# Patient Record
Sex: Female | Born: 1945
Health system: Southern US, Community
[De-identification: ages and names within clinical notes are randomized; demographics above are authoritative.]

## PROBLEM LIST (undated history)

## (undated) DIAGNOSIS — I213 ST elevation (STEMI) myocardial infarction of unspecified site: Secondary | ICD-10-CM

## (undated) DIAGNOSIS — H269 Unspecified cataract: Secondary | ICD-10-CM

## (undated) DIAGNOSIS — I255 Ischemic cardiomyopathy: Secondary | ICD-10-CM

## (undated) DIAGNOSIS — E785 Hyperlipidemia, unspecified: Secondary | ICD-10-CM

## (undated) DIAGNOSIS — I5022 Chronic systolic (congestive) heart failure: Secondary | ICD-10-CM

## (undated) DIAGNOSIS — K219 Gastro-esophageal reflux disease without esophagitis: Secondary | ICD-10-CM

## (undated) DIAGNOSIS — I2510 Atherosclerotic heart disease of native coronary artery without angina pectoris: Secondary | ICD-10-CM

## (undated) DIAGNOSIS — F0390 Unspecified dementia without behavioral disturbance: Secondary | ICD-10-CM

## (undated) DIAGNOSIS — I10 Essential (primary) hypertension: Secondary | ICD-10-CM

## (undated) HISTORY — PX: CORONARY ANGIOPLASTY WITH STENT PLACEMENT: SHX49

## (undated) HISTORY — DX: Atherosclerotic heart disease of native coronary artery without angina pectoris: I25.10

## (undated) HISTORY — PX: CATARACT EXTRACTION W/ INTRAOCULAR LENS IMPLANT: SHX1309

## (undated) HISTORY — DX: Hyperlipidemia, unspecified: E78.5

## (undated) HISTORY — DX: Ischemic cardiomyopathy: I25.5

## (undated) HISTORY — DX: Chronic systolic (congestive) heart failure: I50.22

## (undated) HISTORY — DX: Unspecified cataract: H26.9

## (undated) HISTORY — DX: Essential (primary) hypertension: I10

## (undated) SURGERY — ERCP, WITH INTERVENTION IF INDICATED
Anesthesia: Moderate Sedation

---

## 1968-10-27 HISTORY — PX: DILATION AND CURETTAGE OF UTERUS: SHX78

## 1978-10-28 HISTORY — PX: TONSILLECTOMY AND ADENOIDECTOMY: SUR1326

## 2009-09-05 ENCOUNTER — Emergency Department: Payer: Self-pay | Admitting: Emergency Medicine

## 2009-09-22 LAB — BASIC METABOLIC PANEL
BUN: 32 mg/dL — AB (ref 4–21)
Creatinine: 1.2 mg/dL — AB (ref <=1.1)

## 2009-09-22 LAB — HEMOGLOBIN A1C: Hgb A1c MFr Bld: 9.7 % — AB (ref 4.0–6.0)

## 2009-10-24 ENCOUNTER — Ambulatory Visit: Payer: Self-pay | Admitting: Cardiovascular Disease

## 2009-11-10 ENCOUNTER — Ambulatory Visit: Admission: RE | Admit: 2009-11-10 | Discharge: 2009-11-10 | Payer: Self-pay | Admitting: Internal Medicine

## 2009-11-10 ENCOUNTER — Ambulatory Visit (HOSPITAL_COMMUNITY): Admission: RE | Admit: 2009-11-10 | Discharge: 2009-11-10 | Payer: Self-pay | Admitting: Internal Medicine

## 2009-11-11 ENCOUNTER — Encounter: Admission: RE | Admit: 2009-11-11 | Discharge: 2009-11-11 | Payer: Self-pay | Admitting: Internal Medicine

## 2009-12-28 ENCOUNTER — Ambulatory Visit: Payer: Self-pay | Admitting: Cardiovascular Disease

## 2010-03-07 ENCOUNTER — Encounter: Payer: Self-pay | Admitting: Cardiovascular Disease

## 2010-03-07 DIAGNOSIS — E785 Hyperlipidemia, unspecified: Secondary | ICD-10-CM | POA: Insufficient documentation

## 2010-03-07 DIAGNOSIS — I10 Essential (primary) hypertension: Secondary | ICD-10-CM | POA: Insufficient documentation

## 2010-03-07 DIAGNOSIS — I2510 Atherosclerotic heart disease of native coronary artery without angina pectoris: Secondary | ICD-10-CM | POA: Insufficient documentation

## 2010-03-07 DIAGNOSIS — I5022 Chronic systolic (congestive) heart failure: Secondary | ICD-10-CM | POA: Insufficient documentation

## 2010-03-07 DIAGNOSIS — I255 Ischemic cardiomyopathy: Secondary | ICD-10-CM | POA: Insufficient documentation

## 2010-03-30 ENCOUNTER — Ambulatory Visit (INDEPENDENT_AMBULATORY_CARE_PROVIDER_SITE_OTHER): Payer: Medicare Other | Admitting: Cardiovascular Disease

## 2010-03-30 DIAGNOSIS — I5022 Chronic systolic (congestive) heart failure: Secondary | ICD-10-CM

## 2010-03-30 DIAGNOSIS — I2510 Atherosclerotic heart disease of native coronary artery without angina pectoris: Secondary | ICD-10-CM

## 2010-03-30 DIAGNOSIS — Z9861 Coronary angioplasty status: Secondary | ICD-10-CM

## 2010-07-18 ENCOUNTER — Encounter: Payer: Self-pay | Admitting: Cardiovascular Disease

## 2010-07-18 ENCOUNTER — Ambulatory Visit (INDEPENDENT_AMBULATORY_CARE_PROVIDER_SITE_OTHER): Payer: Medicare Other | Admitting: Cardiovascular Disease

## 2010-07-18 VITALS — BP 94/60 | HR 64 | Wt 255.0 lb

## 2010-07-18 DIAGNOSIS — I2510 Atherosclerotic heart disease of native coronary artery without angina pectoris: Secondary | ICD-10-CM

## 2010-07-18 DIAGNOSIS — I509 Heart failure, unspecified: Secondary | ICD-10-CM

## 2010-07-18 MED ORDER — CLOPIDOGREL BISULFATE 75 MG PO TABS: 75.0000 mg | ORAL_TABLET | Freq: Every day | ORAL | Status: DC

## 2010-07-18 NOTE — Assessment & Plan Note (Signed)
She has not had any angina. She has a bare-metal stent in her right coronary artery. The left anterior descending artery is chronically occluded. I do not think that she needs to restart her Plavix since she's been out of it for several weeks already. She will increase her aspirin to 25 mg a day.

## 2010-07-18 NOTE — Assessment & Plan Note (Signed)
Her congestive heart failure appears to be fairly stable. She is on carvedilol as well as lisinopril. Her blood pressure is marginal and we will not be able to increase the dose of those medications.    She has gained a lot of weight I suspect that this is at least partially responsible for her shortness of breath. I've encouraged her to exercise regularly and to lose weight.

## 2010-07-18 NOTE — Progress Notes (Signed)
C/o sob denies chest pain, pt not been on plavix for " a while". Stented 08/2009, pt hypotensive and not feeling well, laying around a lot.Alfonso Ramus RN

## 2010-07-18 NOTE — Progress Notes (Signed)
Kaitlyn Simmons Date of Birth  [birth year] [facility] [address]     [address] [place], [place]  [address] [phone]  Fax  [phone]  History of Present Illness: Pt presents for routine visit.  She is a middle-age female with a history of coronary artery disease and subsequent ischemic cardiopathy and congestive heart failure. She has a chronically occluded left anterior descending artery. She has had PTCA and stenting of her right coronary artery. Her left ventricular systolic function is moderately depressed with an ejection fraction of around 30%. All of this work is done at [facility] - July , 2011.   Ran out of her Plavix. She has a bare metal stent in her RCA placed at [facility] in July , 2011.  No significant episodes of angina.  Moderate dyspnea  Doing everyday activities.  She has gained some weight ( 24 lbs) since last visit.  Current Outpatient Prescriptions on File Prior to Visit  Medication Sig Dispense Refill  . acetaminophen (TYLENOL) 325 MG tablet Take 650 mg by mouth as needed.        Marland Kitchen aspirin 81 MG tablet Take 81 mg by mouth daily.        Marland Kitchen atorvastatin (LIPITOR) 80 MG tablet Take 80 mg by mouth daily.        . carvedilol (COREG) 6.25 MG tablet Take 6.25 mg by mouth 2 (two) times daily with meals.        Marland Kitchen lisinopril (PRINIVIL,ZESTRIL) 5 MG tablet Take 5 mg by mouth daily.        . pantoprazole (PROTONIX) 40 MG tablet Take 40 mg by mouth daily.        . risperiDONE (RISPERDAL) 0.25 MG tablet Take 0.25 mg by mouth nightly.        Marland Kitchen spironolactone (ALDACTONE) 25 MG tablet Take 25 mg by mouth daily.        Marland Kitchen DISCONTD: clopidogrel (PLAVIX) 75 MG tablet Take 75 mg by mouth daily.          No Known Allergies  Past Medical History  Diagnosis Date  . Myocardial infarction     JULY 2011  . CHF (congestive heart failure)     EF: 30%  . HTN (hypertension)   . Hyperlipidemia   . Ischemic cardiomyopathy   . Coronary artery  disease     post PTCA and stenting    Past Surgical History  Procedure Date  . Coronary angioplasty with stent placement     RCA  . Dilation and curettage of uterus     History  Smoking status  . Former Smoker  . Quit date: 10/25/2002  Smokeless tobacco  . Not on file    History  Alcohol Use No    Family History  Problem Relation Age of Onset  . Heart attack Father     DECEASED  . Stroke Father     DECEASED  . Leukemia Mother     DECEASED  . Heart attack Sister     ALIVE  . Hypertension Sister     Reviw of Systems:  Reviewed in the HPI.  All other systems are negative.  Physical Exam: BP 94/60  Pulse 64  Wt 255 lb (115.667 kg)  SpO2 97% The patient is alert and oriented x 3.  The mood and affect are normal.  The skin is warm and dry.  Color is normal.  The HEENT exam reveals that the sclera are nonicteric.  The mucous membranes are moist.  The carotids are 2+ without bruits.  There is no thyromegaly.  There is no JVD.  The lungs are clear.  The chest wall is non tender.  The heart exam reveals a regular rate with a normal S1 and S2.  There are no murmurs, gallops, or rubs.  The PMI is not displaced.   Abdominal exam reveals good bowel sounds.  There is no guarding or rebound.  There is no hepatosplenomegaly or tenderness.  There are no masses.  Exam of the legs reveal no clubbing, cyanosis, or edema.  The legs are without rashes.  The distal pulses are intact.  Cranial nerves II - XII are intact.  Motor and sensory functions are intact.  The gait is normal.  Assessment / Plan:

## 2010-08-07 ENCOUNTER — Encounter: Payer: Self-pay | Admitting: Cardiovascular Disease

## 2010-09-17 ENCOUNTER — Emergency Department: Payer: Self-pay | Admitting: Emergency Medicine

## 2010-10-09 ENCOUNTER — Other Ambulatory Visit: Payer: Self-pay | Admitting: *Deleted

## 2010-10-09 ENCOUNTER — Ambulatory Visit (INDEPENDENT_AMBULATORY_CARE_PROVIDER_SITE_OTHER): Payer: Medicare Other | Admitting: Nurse Practitioner

## 2010-10-09 ENCOUNTER — Encounter: Payer: Self-pay | Admitting: Nurse Practitioner

## 2010-10-09 VITALS — BP 136/96 | HR 65 | Ht 65.0 in | Wt 249.2 lb

## 2010-10-09 DIAGNOSIS — I2589 Other forms of chronic ischemic heart disease: Secondary | ICD-10-CM

## 2010-10-09 DIAGNOSIS — E785 Hyperlipidemia, unspecified: Secondary | ICD-10-CM

## 2010-10-09 DIAGNOSIS — I2510 Atherosclerotic heart disease of native coronary artery without angina pectoris: Secondary | ICD-10-CM

## 2010-10-09 DIAGNOSIS — I219 Acute myocardial infarction, unspecified: Secondary | ICD-10-CM

## 2010-10-09 DIAGNOSIS — I255 Ischemic cardiomyopathy: Secondary | ICD-10-CM

## 2010-10-09 DIAGNOSIS — I213 ST elevation (STEMI) myocardial infarction of unspecified site: Secondary | ICD-10-CM

## 2010-10-09 LAB — CBC WITH DIFFERENTIAL/PLATELET
Basophils Absolute: 0.1 10*3/uL (ref 0.0–0.1)
Basophils Relative: 0.8 % (ref 0.0–3.0)
Eosinophils Absolute: 0.2 10*3/uL (ref 0.0–0.7)
Eosinophils Relative: 2.2 % (ref 0.0–5.0)
HCT: 36.4 % (ref 36.0–46.0)
Hemoglobin: 12 g/dL (ref 12.0–15.0)
Lymphocytes Relative: 18.6 % (ref 12.0–46.0)
Lymphs Abs: 1.7 10*3/uL (ref 0.7–4.0)
MCHC: 33 g/dL (ref 30.0–36.0)
MCV: 92.2 fl (ref 78.0–100.0)
Monocytes Absolute: 0.8 10*3/uL (ref 0.1–1.0)
Monocytes Relative: 8.5 % (ref 3.0–12.0)
Neutro Abs: 6.4 10*3/uL (ref 1.4–7.7)
Neutrophils Relative %: 69.9 % (ref 43.0–77.0)
Platelets: 318 10*3/uL (ref 150.0–400.0)
RBC: 3.95 Mil/uL (ref 3.87–5.11)
RDW: 14.8 % — ABNORMAL HIGH (ref 11.5–14.6)
WBC: 9.2 10*3/uL (ref 4.5–10.5)

## 2010-10-09 LAB — BASIC METABOLIC PANEL
BUN: 18 mg/dL (ref 6–23)
CO2: 29 mEq/L (ref 19–32)
Calcium: 9.8 mg/dL (ref 8.4–10.5)
Chloride: 99 mEq/L (ref 96–112)
Creatinine, Ser: 1.4 mg/dL — ABNORMAL HIGH (ref 0.4–1.2)
GFR: 38.62 mL/min — ABNORMAL LOW (ref >60.00)
Glucose, Bld: 105 mg/dL — ABNORMAL HIGH (ref 70–99)
Potassium: 4.5 mEq/L (ref 3.5–5.1)
Sodium: 139 mEq/L (ref 135–145)

## 2010-10-09 MED ORDER — LISINOPRIL 5 MG PO TABS: 10.0000 mg | ORAL_TABLET | Freq: Every day | ORAL | Status: DC

## 2010-10-09 NOTE — Progress Notes (Signed)
Kaitlyn Simmons Date of Birth: 1946-01-31   History of Present Illness: Kaitlyn Simmons is seen today for a post hospital visit. She is seen for Dr. Elease Simmons. She was admitted to [facility] with an MI. She had PCI. We do not have any specific details. Her last EF from her MI last year was 30%. I do not have a current EF. She is doing well with her current medical therapy. She is not having chest pain. She is trying to walk. She is watching her salt. She is interested in cardiac rehab. She does not check her blood pressure at home. She is here with her daughter, Kaitlyn Simmons, who notes some forgetfulness but it is improving since discharge.   Current Outpatient Prescriptions on File Prior to Visit  Medication Sig Dispense Refill  . acetaminophen (TYLENOL) 325 MG tablet Take 650 mg by mouth as needed.        Marland Kitchen aspirin 81 MG tablet Take 81 mg by mouth daily.        Marland Kitchen atorvastatin (LIPITOR) 80 MG tablet Take 80 mg by mouth daily.        . carvedilol (COREG) 6.25 MG tablet Take 6.25 mg by mouth 2 (two) times daily with meals.        . clopidogrel (PLAVIX) 75 MG tablet Take 1 tablet (75 mg total) by mouth daily.  30 tablet  11  . pantoprazole (PROTONIX) 40 MG tablet Take 40 mg by mouth daily.        Marland Kitchen spironolactone (ALDACTONE) 25 MG tablet Take 25 mg by mouth daily.        Marland Kitchen DISCONTD: lisinopril (PRINIVIL,ZESTRIL) 5 MG tablet Take 5 mg by mouth daily.          No Known Allergies  Past Medical History  Diagnosis Date  . Myocardial infarction     JULY 2011 & July 2012  . CHF (congestive heart failure)     EF: 30%  . HTN (hypertension)   . Hyperlipidemia   . Ischemic cardiomyopathy   . Coronary artery disease     post PTCA and stenting of the RCA in 2011, s/p PCI to RCA and LAD in 2012    Past Surgical History  Procedure Date  . Coronary angioplasty with stent placement 2011    RCA  . Dilation and curettage of uterus   . Coronary stent placement 2012    RCA & LAD    History  Smoking status  .  Former Smoker  . Quit date: 10/25/2002  Smokeless tobacco  . Not on file    History  Alcohol Use No    Family History  Problem Relation Age of Onset  . Heart attack Father     DECEASED  . Stroke Father     DECEASED  . Leukemia Mother     DECEASED  . Heart attack Sister     ALIVE  . Hypertension Sister     Review of Systems: The review of systems is positive for forgetfulness that is improving. No chest pain or shortness of breath.  She has had no issues with her groin or arm (cath sites). All other systems were reviewed and are negative.  Physical Exam: BP 136/96  Pulse 65  Ht 5\' 5"  (1.651 m)  Wt 249 lb 3.2 oz (113.036 kg)  BMI 41.47 kg/m2 Patient is very pleasant and in no acute distress. She is obese. Skin is warm and dry. Color is normal.  HEENT is unremarkable. Normocephalic/atraumatic.  PERRL. Sclera are nonicteric. Neck is supple. No masses. No JVD. Lungs are clear. Cardiac exam shows a regular rate and rhythm. Abdomen is soft. Extremities are without edema. Gait and ROM are intact. No gross neurologic deficits noted.  LABORATORY DATA: PENDING  Assessment / Plan:

## 2010-10-09 NOTE — Patient Instructions (Signed)
Increase your Lisinopril to 10 mg daily (take two of your 5 mg tablets daily) Continue with walking as tolerated. Goal will be to work up to 45 minutes each day We will refer you to cardiac rehab in [place] I will see you in 2 weeks.  We will check labs on return. You do not need to fast. Call for any problems I will try and get your records from [facility]

## 2010-10-09 NOTE — Assessment & Plan Note (Addendum)
She has had recurrent MI. She has had another stent placed to the RCA and a new stent to the LAD. We do not have those details. We will obtain records. She is doing well. I have referred her to cardiac rehab in [place]. We will check labs today. Patient is agreeable to this plan and will call if any problems develop in the interim.   Records are now reviewed after the patient has left the office.  She had an anterolateral STEMI and is status post staged PCI. She had BMS to the RCA and then was taken back to the cath lab 2 weeks later for BMS to the mid LAD. She required IABP. She was slow to recover. Other problems included acute heart failure, UTI, acute delirium and acute on chronic kidney disease, stage III. EF was still 35% during that admission.

## 2010-10-09 NOTE — Assessment & Plan Note (Signed)
Last EF was 30% from prior MI. No current EF. Will obtain records. Lisinopril is increased. Labs are checked today. I explained the importance of up titration of her medicines. I will see her back in 2 weeks. Patient is agreeable to this plan and will call if any problems develop in the interim.

## 2010-10-10 ENCOUNTER — Encounter: Payer: Self-pay | Admitting: Nurse Practitioner

## 2010-10-10 ENCOUNTER — Telehealth: Payer: Self-pay | Admitting: *Deleted

## 2010-10-10 NOTE — Telephone Encounter (Signed)
Faxed referral for cardiac rehab at [facility] card. Rehab.Alfonso Ramus RN

## 2010-10-11 ENCOUNTER — Telehealth: Payer: Self-pay | Admitting: *Deleted

## 2010-10-11 NOTE — Telephone Encounter (Signed)
Message copied by Antony Odea on Wed Oct 11, 2010  4:47 PM ------      Message from: Rosalio Macadamia      Created: Mon Oct 09, 2010  4:07 PM       Potassium is ok. Will need repeat BMET on return visit.

## 2010-10-11 NOTE — Telephone Encounter (Signed)
Patient called with lab results. Pt verbalized understanding. Jodette Alenna Russell RN  

## 2010-10-23 ENCOUNTER — Other Ambulatory Visit (INDEPENDENT_AMBULATORY_CARE_PROVIDER_SITE_OTHER): Payer: Medicare Other | Admitting: *Deleted

## 2010-10-23 ENCOUNTER — Encounter: Payer: Self-pay | Admitting: Nurse Practitioner

## 2010-10-23 ENCOUNTER — Telehealth: Payer: Self-pay | Admitting: *Deleted

## 2010-10-23 ENCOUNTER — Ambulatory Visit (INDEPENDENT_AMBULATORY_CARE_PROVIDER_SITE_OTHER): Payer: Medicare Other | Admitting: Nurse Practitioner

## 2010-10-23 VITALS — BP 122/76 | HR 84 | Ht 65.0 in | Wt 253.2 lb

## 2010-10-23 DIAGNOSIS — I509 Heart failure, unspecified: Secondary | ICD-10-CM

## 2010-10-23 DIAGNOSIS — E785 Hyperlipidemia, unspecified: Secondary | ICD-10-CM

## 2010-10-23 DIAGNOSIS — I219 Acute myocardial infarction, unspecified: Secondary | ICD-10-CM

## 2010-10-23 DIAGNOSIS — I255 Ischemic cardiomyopathy: Secondary | ICD-10-CM

## 2010-10-23 DIAGNOSIS — I2510 Atherosclerotic heart disease of native coronary artery without angina pectoris: Secondary | ICD-10-CM

## 2010-10-23 DIAGNOSIS — I213 ST elevation (STEMI) myocardial infarction of unspecified site: Secondary | ICD-10-CM

## 2010-10-23 DIAGNOSIS — I2109 ST elevation (STEMI) myocardial infarction involving other coronary artery of anterior wall: Secondary | ICD-10-CM

## 2010-10-23 DIAGNOSIS — I10 Essential (primary) hypertension: Secondary | ICD-10-CM

## 2010-10-23 DIAGNOSIS — I2589 Other forms of chronic ischemic heart disease: Secondary | ICD-10-CM

## 2010-10-23 LAB — BASIC METABOLIC PANEL
BUN: 12 mg/dL (ref 6–23)
CO2: 26 mEq/L (ref 19–32)
Calcium: 9.4 mg/dL (ref 8.4–10.5)
Chloride: 104 mEq/L (ref 96–112)
Creatinine, Ser: 1.2 mg/dL (ref 0.4–1.2)
GFR: 50.06 mL/min — ABNORMAL LOW (ref >60.00)
Glucose, Bld: 96 mg/dL (ref 70–99)
Potassium: 4.5 mEq/L (ref 3.5–5.1)
Sodium: 140 mEq/L (ref 135–145)

## 2010-10-23 LAB — HEPATIC FUNCTION PANEL
ALT: 21 U/L (ref 0–35)
AST: 25 U/L (ref 0–37)
Albumin: 4 g/dL (ref 3.5–5.2)
Alkaline Phosphatase: 78 U/L (ref 39–117)
Bilirubin, Direct: 0.1 mg/dL (ref 0.0–0.3)
Total Protein: 7 g/dL (ref 6.0–8.3)

## 2010-10-23 LAB — LIPID PANEL
Cholesterol: 138 mg/dL (ref 0–200)
LDL Cholesterol: 68 mg/dL (ref 0–99)
Triglycerides: 161 mg/dL — ABNORMAL HIGH (ref 0.0–149.0)

## 2010-10-23 MED ORDER — LISINOPRIL 10 MG PO TABS: 10.0000 mg | ORAL_TABLET | Freq: Every day | ORAL | Status: DC

## 2010-10-23 MED ORDER — CARVEDILOL 6.25 MG PO TABS: 12.5000 mg | ORAL_TABLET | Freq: Two times a day (BID) | ORAL | Status: DC

## 2010-10-23 NOTE — Assessment & Plan Note (Signed)
Blood pressure looks good. We have room to increase her Coreg.

## 2010-10-23 NOTE — Progress Notes (Signed)
Kaitlyn Simmons Date of Birth: 1945/09/27   History of Present Illness: Kaitlyn Simmons is seen back today for a 2 week check. She is seen for Dr. Elease Hashimoto. She has had an anterolateral MI and is s/p staged PCI with BMS to the RCA and BMS to the mid LAD. This occurred at [facility]. EF remains 35%. We increased her lisinopril at the last visit. She is doing well. No chest pain. Mild DOE reported. She is trying to walk some. Weight has been stable. No CHF symptoms. She is doing well.   Current Outpatient Prescriptions on File Prior to Visit  Medication Sig Dispense Refill  . aspirin 81 MG tablet Take 81 mg by mouth daily.        Marland Kitchen atorvastatin (LIPITOR) 80 MG tablet Take 80 mg by mouth daily.        . carvedilol (COREG) 6.25 MG tablet Take 6.25 mg by mouth 2 (two) times daily with meals.        . clopidogrel (PLAVIX) 75 MG tablet Take 1 tablet (75 mg total) by mouth daily.  30 tablet  11  . lisinopril (PRINIVIL,ZESTRIL) 5 MG tablet Take 2 tablets (10 mg total) by mouth daily.      . pantoprazole (PROTONIX) 40 MG tablet Take 40 mg by mouth daily.        Marland Kitchen spironolactone (ALDACTONE) 25 MG tablet Take 25 mg by mouth daily.        Marland Kitchen acetaminophen (TYLENOL) 325 MG tablet Take 650 mg by mouth as needed.          No Known Allergies  Past Medical History  Diagnosis Date  . Myocardial infarction     JULY 2011 & July 2012  . CHF (congestive heart failure)     EF: 30%  . HTN (hypertension)   . Hyperlipidemia   . Ischemic cardiomyopathy   . Coronary artery disease     post PTCA and stenting of the RCA in 2011, s/p PCI to RCA and LAD in 2012    Past Surgical History  Procedure Date  . Coronary angioplasty with stent placement 2011    RCA  . Dilation and curettage of uterus   . Coronary stent placement 2012    RCA & LAD    History  Smoking status  . Former Smoker  . Quit date: 10/25/2002  Smokeless tobacco  . Not on file    History  Alcohol Use No    Family History  Problem Relation  Age of Onset  . Heart attack Father     DECEASED  . Stroke Father     DECEASED  . Leukemia Mother     DECEASED  . Heart attack Sister     ALIVE  . Hypertension Sister     Review of Systems: The review of systems is as above. Her daughter is here and reports that her mom is doing very well.  No dizziness or chest pain reported. She is tolerating her medicines. No cough. All other systems were reviewed and are negative.  Physical Exam: BP 122/76  Pulse 84  Ht 5\' 5"  (1.651 m)  Wt 253 lb 3.2 oz (114.851 kg)  BMI 42.13 kg/m2 Patient is very pleasant and in no acute distress. She is obese. Skin is warm and dry. Color is normal.  HEENT is unremarkable. Normocephalic/atraumatic. PERRL. Sclera are nonicteric. Neck is supple. No masses. No JVD. Lungs are clear. Cardiac exam shows a regular rate and rhythm. Abdomen is  obese but soft. Extremities are without edema. Gait and ROM are intact. No gross neurologic deficits noted.  LABORATORY DATA: BMET is pending   Assessment / Plan:

## 2010-10-23 NOTE — Patient Instructions (Addendum)
Lets increase the Coreg to 12.5 mg two times a day. You can take two of your 6.25 mg tablets two times a day Stay on your other medicines We will see you back in 3 weeks.

## 2010-10-23 NOTE — Assessment & Plan Note (Signed)
She has had recent MI with 2 vessel staged PCI. She is doing well. No chest pain.

## 2010-10-23 NOTE — Assessment & Plan Note (Signed)
EF is 35%. We are trying to uptitrate her medicines. Coreg is increased to 12.5 mg BID. We will see back in 3 weeks. Patient is agreeable to this plan and will call if any problems develop in the interim.

## 2010-10-23 NOTE — Telephone Encounter (Signed)
Patient called with lab results. msg left to call back for more information. Alfonso Ramus RN

## 2010-10-25 ENCOUNTER — Telehealth: Payer: Self-pay | Admitting: Cardiology

## 2010-10-25 NOTE — Telephone Encounter (Signed)
Message copied by Karle Plumber on Wed Oct 25, 2010  8:59 AM ------      Message from: [place], [place] J      Created: Tue Oct 24, 2010  5:40 PM       Triglycerides are mildly elevated.  Continue diet, and meds.

## 2010-10-25 NOTE — Telephone Encounter (Signed)
Lab results reported. Pt verbalizes understanding to continue same diet and medications.

## 2010-11-13 ENCOUNTER — Encounter: Payer: Self-pay | Admitting: Nurse Practitioner

## 2010-11-13 ENCOUNTER — Ambulatory Visit (INDEPENDENT_AMBULATORY_CARE_PROVIDER_SITE_OTHER): Payer: Medicare Other | Admitting: Nurse Practitioner

## 2010-11-13 VITALS — BP 138/98 | HR 64 | Ht 65.5 in | Wt 254.0 lb

## 2010-11-13 DIAGNOSIS — I2589 Other forms of chronic ischemic heart disease: Secondary | ICD-10-CM

## 2010-11-13 DIAGNOSIS — I509 Heart failure, unspecified: Secondary | ICD-10-CM

## 2010-11-13 DIAGNOSIS — I10 Essential (primary) hypertension: Secondary | ICD-10-CM

## 2010-11-13 DIAGNOSIS — I255 Ischemic cardiomyopathy: Secondary | ICD-10-CM

## 2010-11-13 DIAGNOSIS — I2510 Atherosclerotic heart disease of native coronary artery without angina pectoris: Secondary | ICD-10-CM

## 2010-11-13 MED ORDER — LISINOPRIL 10 MG PO TABS: 10.0000 mg | ORAL_TABLET | Freq: Two times a day (BID) | ORAL | Status: DC

## 2010-11-13 NOTE — Assessment & Plan Note (Signed)
Lisinopril is increased today for her CHF. Hopefully this will also benefit her blood pressure.

## 2010-11-13 NOTE — Assessment & Plan Note (Signed)
Looks compensated today.

## 2010-11-13 NOTE — Patient Instructions (Addendum)
We are going to increase your Lisinopril to 10 mg two times a day I will see you back in 3 weeks. We will check some blood work at your next visit. You do not need to fast.   Continue with your current medicines. Weigh yourself each morning and record. Let us know if your have a weight gain of 3 pounds in 24 hours. Limit sodium intake.

## 2010-11-13 NOTE — Assessment & Plan Note (Signed)
She is doing well. Lisinopril is increased to 10 mg BID. I will see her back in 3 weeks. Will check BMET on return. Hope to increase Coreg at next visit. Patient is agreeable to this plan and will call if any problems develop in the interim.

## 2010-11-13 NOTE — Progress Notes (Signed)
    Kaitlyn Simmons Date of Birth: 1945-08-26   History of Present Illness: Kaitlyn Simmons is seen back today for a 3 week check. She is seen for Kaitlyn Simmons. She had anterolateral MI and is s/p staged PCI with BMS to the RCA and BMS to the mid LAD. EF remains 35%. We are uptitrating her medicines. She feels ok. No chest pain. She is not short of breath. No dizziness. She is tolerating her medicines.   Current Outpatient Prescriptions on File Prior to Visit  Medication Sig Dispense Refill  . acetaminophen (TYLENOL) 325 MG tablet Take 650 mg by mouth as needed.        Marland Kitchen aspirin 81 MG tablet Take 81 mg by mouth daily.        Marland Kitchen atorvastatin (LIPITOR) 80 MG tablet Take 80 mg by mouth daily.        . clopidogrel (PLAVIX) 75 MG tablet Take 1 tablet (75 mg total) by mouth daily.  30 tablet  11  . pantoprazole (PROTONIX) 40 MG tablet Take 40 mg by mouth daily.        Marland Kitchen spironolactone (ALDACTONE) 25 MG tablet Take 25 mg by mouth daily.          No Known Allergies  Past Medical History  Diagnosis Date  . Myocardial infarction     JULY 2011 & July 2012  . CHF (congestive heart failure)     EF: 30 to 35%  . HTN (hypertension)   . Hyperlipidemia   . Ischemic cardiomyopathy   . Coronary artery disease     post PTCA and stenting of the RCA in 2011, s/p PCI to RCA and LAD in 2012    Past Surgical History  Procedure Date  . Coronary angioplasty with stent placement 2011    RCA  . Dilation and curettage of uterus   . Coronary stent placement 2012    RCA & LAD    History  Smoking status  . Former Smoker  . Quit date: 10/25/2002  Smokeless tobacco  . Not on file    History  Alcohol Use No    Family History  Problem Relation Age of Onset  . Heart attack Father     DECEASED  . Stroke Father     DECEASED  . Leukemia Mother     DECEASED  . Heart attack Sister     ALIVE  . Hypertension Sister     Review of Systems: The review of systems is as above.  No SOB, PND, edema or  orthopnea. No cough. All other systems were reviewed and are negative.  Physical Exam: BP 138/98  Pulse 64  Ht 5' 5.5" (1.664 m)  Wt 254 lb (115.214 kg)  BMI 41.63 kg/m2 Patient is very pleasant and in no acute distress. She is obese. Skin is warm and dry. Color is normal.  HEENT is unremarkable. Normocephalic/atraumatic. PERRL. Sclera are nonicteric. Neck is supple. No masses. No JVD. Lungs are clear. Cardiac exam shows a regular rate and rhythm. Abdomen is obese but soft. Extremities are without edema. Gait and ROM are intact. No gross neurologic deficits noted.   LABORATORY DATA:   Assessment / Plan:

## 2010-12-04 ENCOUNTER — Other Ambulatory Visit (INDEPENDENT_AMBULATORY_CARE_PROVIDER_SITE_OTHER): Payer: Medicare Other | Admitting: *Deleted

## 2010-12-04 ENCOUNTER — Ambulatory Visit (INDEPENDENT_AMBULATORY_CARE_PROVIDER_SITE_OTHER): Payer: Medicare Other | Admitting: Nurse Practitioner

## 2010-12-04 ENCOUNTER — Encounter: Payer: Self-pay | Admitting: Nurse Practitioner

## 2010-12-04 DIAGNOSIS — I2589 Other forms of chronic ischemic heart disease: Secondary | ICD-10-CM

## 2010-12-04 DIAGNOSIS — I10 Essential (primary) hypertension: Secondary | ICD-10-CM

## 2010-12-04 DIAGNOSIS — I255 Ischemic cardiomyopathy: Secondary | ICD-10-CM

## 2010-12-04 LAB — BASIC METABOLIC PANEL
BUN: 19 mg/dL (ref 6–23)
CO2: 28 mEq/L (ref 19–32)
Calcium: 9.1 mg/dL (ref 8.4–10.5)
Chloride: 103 mEq/L (ref 96–112)
Creatinine, Ser: 1.3 mg/dL — ABNORMAL HIGH (ref 0.4–1.2)
GFR: 45.03 mL/min — ABNORMAL LOW (ref >60.00)
Glucose, Bld: 99 mg/dL (ref 70–99)
Potassium: 4 mEq/L (ref 3.5–5.1)
Sodium: 140 mEq/L (ref 135–145)

## 2010-12-04 MED ORDER — CARVEDILOL 25 MG PO TABS: 25.0000 mg | ORAL_TABLET | Freq: Two times a day (BID) | ORAL | Status: DC

## 2010-12-04 NOTE — Assessment & Plan Note (Addendum)
She is doing well. I have increased her Coreg to 25 mg BID. She is on target dose of ACE. She is also on aldactone. She looks compensated. BMET is checked today. I will have her see Dr. Elease Hashimoto back in 1 month. She will need a follow up echo. Patient is agreeable to this plan and will call if any problems develop in the interim.

## 2010-12-04 NOTE — Progress Notes (Signed)
    Kaitlyn Simmons Date of Birth: 1946/01/28   History of Present Illness: Kaitlyn Simmons is seen back today for a follow up visit. She is seen for Dr. Elease Hashimoto. She is doing well. She says she continues to get stronger. No real chest pain or shortness of breath. Some indigestion reported. No NTG use. No CHF symptoms. She is tolerating her medicines. She had an anterolateral MI and is s/p staged PCI with a MBS to the RCA and BMS to the mid LAD. EF remains at 35%. We are up titrating her medicines.   Current Outpatient Prescriptions on File Prior to Visit  Medication Sig Dispense Refill  . acetaminophen (TYLENOL) 325 MG tablet Take 650 mg by mouth as needed.        Marland Kitchen aspirin 81 MG tablet Take 81 mg by mouth daily.        Marland Kitchen atorvastatin (LIPITOR) 80 MG tablet Take 80 mg by mouth daily.        . clopidogrel (PLAVIX) 75 MG tablet Take 1 tablet (75 mg total) by mouth daily.  30 tablet  11  . lisinopril (PRINIVIL,ZESTRIL) 10 MG tablet Take 1 tablet (10 mg total) by mouth 2 (two) times daily.  30 tablet  11  . pantoprazole (PROTONIX) 40 MG tablet Take 40 mg by mouth daily.        Marland Kitchen spironolactone (ALDACTONE) 25 MG tablet Take 25 mg by mouth daily.          No Known Allergies  Past Medical History  Diagnosis Date  . Myocardial infarction     JULY 2011 & July 2012  . CHF (congestive heart failure)     EF: 30 to 35%  . HTN (hypertension)   . Hyperlipidemia   . Ischemic cardiomyopathy   . Coronary artery disease     post PTCA and stenting of the RCA in 2011, s/p PCI to RCA and LAD in 2012    Past Surgical History  Procedure Date  . Coronary angioplasty with stent placement 2011    RCA  . Dilation and curettage of uterus   . Coronary stent placement 2012    RCA & LAD    History  Smoking status  . Former Smoker  . Quit date: 10/25/2002  Smokeless tobacco  . Not on file    History  Alcohol Use No    Family History  Problem Relation Age of Onset  . Heart attack Father     DECEASED   . Stroke Father     DECEASED  . Leukemia Mother     DECEASED  . Heart attack Sister     ALIVE  . Hypertension Sister     Review of Systems: The review of systems is per the HPI.  She notes her blood pressure at home is up a little. All other systems were reviewed and are negative.  Physical Exam: BP 144/90  Pulse 72  Ht 5\' 5"  (1.651 m)  Wt 253 lb 12.8 oz (115.123 kg)  BMI 42.23 kg/m2 Patient is very pleasant and in no acute distress. She is obese. Skin is warm and dry. Color is normal.  HEENT is unremarkable. Normocephalic/atraumatic. PERRL. Sclera are nonicteric. Neck is supple. No masses. No JVD. Lungs are clear. Cardiac exam shows a regular rate and rhythm. Abdomen is soft. Extremities are without edema. Gait and ROM are intact. No gross neurologic deficits noted.  LABORATORY DATA:   Assessment / Plan:

## 2010-12-04 NOTE — Patient Instructions (Signed)
We are going to increase the Coreg to 25 mg twice a day. You may take two of your 12.5 mg tablets two times a day to use them up. I have sent the prescription for the 25 mg tablet on to the drugstore.  Continue with your other current medicines. Weigh yourself each morning and record. Take extra dose of diuretic for weight gain of 3 pounds in 24 hours.   Call the office if you have any concerns. Limit sodium intake. Goal is to have less than 2000 mg (2gm) of salt per day.  I am going to have you see Dr. Elease Hashimoto in one month.

## 2010-12-04 NOTE — Assessment & Plan Note (Signed)
Blood pressure is up a little. Coreg is increased today. She will continue to monitor at home.

## 2010-12-13 ENCOUNTER — Telehealth: Payer: Self-pay | Admitting: *Deleted

## 2010-12-13 NOTE — Telephone Encounter (Signed)
Advised of labs 

## 2010-12-13 NOTE — Telephone Encounter (Signed)
Message copied by Eugenia Pancoast on Wed Dec 13, 2010  9:56 AM ------      Message from: Rosalio Macadamia      Created: Mon Dec 04, 2010  4:05 PM       Ok to report. Labs are satisfactory.

## 2011-06-05 ENCOUNTER — Institutional Professional Consult (permissible substitution): Payer: Medicare Other | Admitting: Cardiovascular Disease

## 2011-06-08 ENCOUNTER — Encounter: Payer: Self-pay | Admitting: Cardiovascular Disease

## 2011-07-25 ENCOUNTER — Other Ambulatory Visit: Payer: Self-pay

## 2011-07-25 MED ORDER — CLOPIDOGREL BISULFATE 75 MG PO TABS: 75.0000 mg | ORAL_TABLET | Freq: Every day | ORAL | Status: DC

## 2011-07-27 ENCOUNTER — Other Ambulatory Visit: Payer: Self-pay | Admitting: *Deleted

## 2011-07-27 NOTE — Telephone Encounter (Signed)
Opened in Error.

## 2011-08-15 ENCOUNTER — Other Ambulatory Visit: Payer: Self-pay | Admitting: *Deleted

## 2011-08-15 MED ORDER — FUROSEMIDE 40 MG PO TABS: 40.0000 mg | ORAL_TABLET | Freq: Every day | ORAL | Status: DC

## 2011-08-15 MED ORDER — CLOPIDOGREL BISULFATE 75 MG PO TABS: 75.0000 mg | ORAL_TABLET | Freq: Every day | ORAL | Status: DC

## 2011-08-15 NOTE — Telephone Encounter (Signed)
Fax Received. Refill Completed. Kaitlyn Simmons (R.M.A)   

## 2011-09-24 ENCOUNTER — Telehealth: Payer: Self-pay | Admitting: *Deleted

## 2011-09-24 NOTE — Telephone Encounter (Signed)
Sent cardiac clearance for cataract surg, [facility]. Dr Vonna Kotyk

## 2011-11-05 ENCOUNTER — Telehealth: Payer: Self-pay | Admitting: Cardiovascular Disease

## 2011-11-05 NOTE — Telephone Encounter (Signed)
Informed pt we have not yet received a request and her Dr will send a request, told pt I will watch for paperwork, pt agreed to plan.

## 2011-11-05 NOTE — Telephone Encounter (Signed)
Pt having eye surgery, needs surgical clearance, pls call [phone]

## 2011-11-13 ENCOUNTER — Encounter: Payer: Self-pay | Admitting: Physician Assistant

## 2011-11-13 ENCOUNTER — Ambulatory Visit (INDEPENDENT_AMBULATORY_CARE_PROVIDER_SITE_OTHER): Payer: Medicare Other | Admitting: Physician Assistant

## 2011-11-13 ENCOUNTER — Telehealth: Payer: Self-pay | Admitting: *Deleted

## 2011-11-13 VITALS — BP 104/69 | HR 60 | Ht 65.0 in | Wt 262.0 lb

## 2011-11-13 DIAGNOSIS — I2510 Atherosclerotic heart disease of native coronary artery without angina pectoris: Secondary | ICD-10-CM

## 2011-11-13 DIAGNOSIS — Z01810 Encounter for preprocedural cardiovascular examination: Secondary | ICD-10-CM

## 2011-11-13 DIAGNOSIS — I2589 Other forms of chronic ischemic heart disease: Secondary | ICD-10-CM

## 2011-11-13 LAB — BASIC METABOLIC PANEL
BUN: 22 mg/dL (ref 6–23)
Calcium: 9.8 mg/dL (ref 8.4–10.5)
GFR: 42.93 mL/min — ABNORMAL LOW (ref >60.00)
Glucose, Bld: 98 mg/dL (ref 70–99)
Potassium: 4.1 mEq/L (ref 3.5–5.1)
Sodium: 139 mEq/L (ref 135–145)

## 2011-11-13 LAB — HEPATIC FUNCTION PANEL
Albumin: 4.1 g/dL (ref 3.5–5.2)
Alkaline Phosphatase: 85 U/L (ref 39–117)
Total Bilirubin: 0.8 mg/dL (ref 0.3–1.2)

## 2011-11-13 MED ORDER — NITROGLYCERIN 0.4 MG SL SUBL: 0.4000 mg | SUBLINGUAL_TABLET | SUBLINGUAL | Status: DC | PRN

## 2011-11-13 NOTE — Telephone Encounter (Signed)
l/m pt left paperwork never scheduled appts

## 2011-11-13 NOTE — Patient Instructions (Addendum)
Your physician recommends that you continue on your current medications as directed. Please refer to the Current Medication list given to you today.   Your physician recommends that you HAVE Lab work TODAY: BMET/LFT  Your physician has requested that you have an echocardiogram. DX: ISCHEMIC CARDIO MYOPATHY 11/23/11 @ 11:30 am. Echocardiography is a painless test that uses sound waves to create images of your heart. It provides your doctor with information about the size and shape of your heart and how well your heart's chambers and valves are working. This procedure takes approximately one hour. There are no restrictions for this procedure.  Your physician recommends that you schedule a follow-up appointment in: 01/04/12 @ 11:30 WITH DR. Elease Hashimoto

## 2011-11-13 NOTE — Progress Notes (Signed)
[address] [place], [place]  [address] Phone: [phone] Fax:  [phone]  Date:  11/13/2011   Name:  Kaitlyn Simmons   DOB:  04-20-1945   MRN:  [id number]  PCP:  Jaclyn Shaggy, MD  Primary Cardiologist:  Dr. Delane Ginger  Primary Electrophysiologist:  None    History of Present Illness: Kaitlyn Simmons is a 66 y.o. female who returns for surgical clearance.  She has a history of CAD, status post inferolateral STEMI 7/11 treated with a BMS to the mid RCA, anterolateral STEMI in 7/12 treated with a BMS to the RCA and staged intervention to the mid LAD with a BMS. This was all performed at [facility]. She also has an ischemic cardiomyopathy. Ejection fraction noted to be 35% in 7/12. She had been following quite regularly with Norma Fredrickson, NP. She was last seen in 10/12. Plan was to get a followup echocardiogram. This was never obtained. She now needs cataract surgery with Dr. Mia Creek. She denies chest discomfort. She denies syncope. She can probably achieve 4 METs without chest pain or shortness of breath. She probably describes class IIb symptoms. She denies orthopnea or PND. She denies significant pedal edema.   Wt Readings from Last 3 Encounters:  11/13/11 262 lb (118.842 kg)  12/04/10 253 lb 12.8 oz (115.123 kg)  11/13/10 254 lb (115.214 kg)     Past Medical History  Diagnosis Date  . CAD (coronary artery disease)     a. s/p IL STEMI 7/11 => tx with BMS to RCA (tx at [facility]);  b.  AL STEMI 7/12 => LHC: mRCA 100% ==> PCI with BMS to RCA; LM 10%, pCFX 50%, OM1 10%, pLAD 50%, mLAD 99%, 70%  ==>  c. staged PCI: BMS to  LAD (tx at [facility])  . Chronic systolic heart failure   . HTN (hypertension)   . Hyperlipidemia   . Ischemic cardiomyopathy     a. EF 30-35% at time of AL STEMI 7/12  . Cataracts, bilateral     Current Outpatient Prescriptions  Medication Sig Dispense Refill  . acetaminophen (TYLENOL) 325 MG tablet Take 650 mg by mouth as needed.         Marland Kitchen aspirin 81 MG tablet Take 81 mg by mouth daily.        Marland Kitchen atorvastatin (LIPITOR) 80 MG tablet Take 80 mg by mouth daily.        . carvedilol (COREG) 25 MG tablet Take 1 tablet (25 mg total) by mouth 2 (two) times daily.  60 tablet  11  . clopidogrel (PLAVIX) 75 MG tablet Take 1 tablet (75 mg total) by mouth daily.  90 tablet  3  . furosemide (LASIX) 40 MG tablet Take 1 tablet (40 mg total) by mouth daily.  90 tablet  2  . lisinopril (PRINIVIL,ZESTRIL) 10 MG tablet Take 1 tablet (10 mg total) by mouth 2 (two) times daily.  30 tablet  11  . pantoprazole (PROTONIX) 40 MG tablet Take 40 mg by mouth daily.        Marland Kitchen spironolactone (ALDACTONE) 25 MG tablet Take 25 mg by mouth daily.        . nitroGLYCERIN (NITROSTAT) 0.4 MG SL tablet Place 1 tablet (0.4 mg total) under the tongue every 5 (five) minutes as needed.  25 tablet  3  . DISCONTD: nitroGLYCERIN (NITROSTAT) 0.4 MG SL tablet Place 0.4 mg under the tongue every 5 (five) minutes as needed.  Allergies: No Known Allergies  History  Substance Use Topics  . Smoking status: Former Smoker    Quit date: 10/25/2002  . Smokeless tobacco: Not on file  . Alcohol Use: No     ROS:  Please see the history of present illness.    All other systems reviewed and negative.   PHYSICAL EXAM: VS:  BP 104/69  Pulse 60  Ht 5\' 5"  (1.651 m)  Wt 262 lb (118.842 kg)  BMI 43.60 kg/m2 Well nourished, well developed, in no acute distress HEENT: normal Neck: no JVD Vascular: No carotid bruits Cardiac:  normal S1, S2; RRR; no murmur Lungs:  clear to auscultation bilaterally, no wheezing, rhonchi or rales Abd: soft, nontender, no hepatomegaly Ext: no edema Skin: warm and dry Neuro:  CNs 2-12 intact, no focal abnormalities noted  EKG:  Sinus rhythm, heart rate 60, normal axis, inferior Q waves, anterior Q waves, nonspecific ST-T wave changes      ASSESSMENT AND PLAN:  1. Coronary Artery Disease:  She is more than a year out from her STEMI.  She was treated with 2 bare-metal stents. I suspect that she will likely not need to hold any of her antiplatelet drugs.  We would prefer she remain on both Plavix and ASA.  However, if the bleeding risk is too high, she should be able to hold her Plavix but will need to remain on her aspirin without interruption.  She does not require any further ischemic evaluation prior to her cataract surgery.  She should be at acceptable risk. Discussed with Dr. Elease Hashimoto.  Followup with Dr. Elease Hashimoto in one to 2 months.  2. Ischemic Cardiomyopathy: Continue current dose of ACE inhibitor, beta blocker and spironolactone. Check a basic metabolic panel today. I will have her set up for a followup echocardiogram. If her ejection fraction remains less than 35%, she will need be referred to electrophysiology for consideration of AICD.   3. Hypertension: Controlled.  4. Hyperlipidemia: Check LFTs today. Continue current dose of Lipitor.  5. Cataracts: As noted above, she may proceed with surgery at acceptable risk.  She should remain on ASA and Plavix.  Luna Glasgow, PA-C  12:29 PM 11/13/2011

## 2011-11-14 ENCOUNTER — Telehealth: Payer: Self-pay | Admitting: Cardiovascular Disease

## 2011-11-14 ENCOUNTER — Telehealth: Payer: Self-pay | Admitting: Physician Assistant

## 2011-11-14 ENCOUNTER — Telehealth: Payer: Self-pay | Admitting: *Deleted

## 2011-11-14 NOTE — Telephone Encounter (Signed)
Message copied by Tarri Fuller on Wed Nov 14, 2011  9:19 AM ------      Message from: [place], [place] T      Created: Tue Nov 13, 2011  5:06 PM       Please notify patient that the lab results are ok.      Tereso Newcomer, PA-C  5:06 PM 11/13/2011

## 2011-11-14 NOTE — Telephone Encounter (Signed)
s/w daughter Clydie Braun and scheduled echo and Nahser appt

## 2011-11-14 NOTE — Telephone Encounter (Signed)
s/w daughter Karen and scheduled echo and Nahser appt 

## 2011-11-14 NOTE — Telephone Encounter (Signed)
Pt calling back re message left for her re appt she needs to schedule, pls call [phone]

## 2011-11-14 NOTE — Telephone Encounter (Signed)
pt notified about lab results, when I told pt that she had left all her paper work behind from [employer] OV w/PA and she did not schedule echo, she hesitated and said she had to d/w sister 1st w/cb to sch. echo

## 2011-11-14 NOTE — Telephone Encounter (Signed)
Pt rtn call pls contact her at same number [phone]

## 2011-11-14 NOTE — Telephone Encounter (Signed)
Pt is returning Carol Fiato's call. 

## 2011-11-14 NOTE — Telephone Encounter (Signed)
Pt daughter rtn call to Okey Regal regarding her mothers appt yesterday

## 2011-11-23 ENCOUNTER — Ambulatory Visit (HOSPITAL_COMMUNITY): Payer: Medicare Other | Attending: Internal Medicine | Admitting: Radiology

## 2011-11-23 ENCOUNTER — Other Ambulatory Visit: Payer: Self-pay

## 2011-11-23 DIAGNOSIS — I369 Nonrheumatic tricuspid valve disorder, unspecified: Secondary | ICD-10-CM | POA: Insufficient documentation

## 2011-11-23 DIAGNOSIS — I10 Essential (primary) hypertension: Secondary | ICD-10-CM | POA: Insufficient documentation

## 2011-11-23 DIAGNOSIS — I5022 Chronic systolic (congestive) heart failure: Secondary | ICD-10-CM | POA: Insufficient documentation

## 2011-11-23 DIAGNOSIS — I2510 Atherosclerotic heart disease of native coronary artery without angina pectoris: Secondary | ICD-10-CM | POA: Insufficient documentation

## 2011-11-23 DIAGNOSIS — Z01810 Encounter for preprocedural cardiovascular examination: Secondary | ICD-10-CM

## 2011-11-23 DIAGNOSIS — I2589 Other forms of chronic ischemic heart disease: Secondary | ICD-10-CM | POA: Insufficient documentation

## 2011-11-26 ENCOUNTER — Telehealth: Payer: Self-pay | Admitting: *Deleted

## 2011-11-26 ENCOUNTER — Encounter: Payer: Self-pay | Admitting: Physician Assistant

## 2011-11-26 NOTE — Telephone Encounter (Signed)
lmom echo good, EF improved 45-50%

## 2011-11-26 NOTE — Telephone Encounter (Signed)
Message copied by Tarri Fuller on Mon Nov 26, 2011 12:11 PM ------      Message from: [place], [place] T      Created: Mon Nov 26, 2011  8:52 AM       EF improved to 45-50%.      Keep follow up as planned.      No change in tx.      Tereso Newcomer, PA-C  8:52 AM 11/26/2011

## 2012-01-04 ENCOUNTER — Ambulatory Visit (INDEPENDENT_AMBULATORY_CARE_PROVIDER_SITE_OTHER): Payer: Medicare Other | Admitting: Cardiovascular Disease

## 2012-01-04 ENCOUNTER — Encounter: Payer: Self-pay | Admitting: Cardiovascular Disease

## 2012-01-04 VITALS — BP 136/90 | HR 85 | Ht 65.0 in | Wt 256.0 lb

## 2012-01-04 DIAGNOSIS — I509 Heart failure, unspecified: Secondary | ICD-10-CM

## 2012-01-04 DIAGNOSIS — I2510 Atherosclerotic heart disease of native coronary artery without angina pectoris: Secondary | ICD-10-CM

## 2012-01-04 MED ORDER — LISINOPRIL 10 MG PO TABS: 10.0000 mg | ORAL_TABLET | Freq: Every day | ORAL | Status: DC

## 2012-01-04 NOTE — Patient Instructions (Addendum)
Your physician wants you to follow-up in: 6 MONTHS You will receive a reminder letter in the mail two months in advance. If you don't receive a letter, please call our office to schedule the follow-up appointment.  Your physician recommends that you return for a FASTING lipid profile: 6 MONTHS    

## 2012-01-04 NOTE — Assessment & Plan Note (Signed)
She's back on her medications. She's not had any episodes of chest pain.

## 2012-01-04 NOTE — Progress Notes (Signed)
Kaitlyn Simmons Date of Birth  1945-10-20       [facility] Office [address]  [address] [place], [place]  [address]   [place], [place]  [address] [phone]     [phone]   Fax  [phone]    Fax [phone]  Problem List: 1. CAD 2. CHF 3. Hyperlipidemia   History of Present Illness:  She has a history of CAD, status post inferolateral STEMI 7/11 treated with a BMS to the mid RCA, anterolateral STEMI in 7/12 treated with a BMS to the RCA and staged intervention to the mid LAD with a BMS. This was all performed at [facility]. She also has an ischemic cardiomyopathy. Ejection fraction noted to be 35% in 7/12. She had been following quite regularly with Kaitlyn Fredrickson, NP. She was last seen in 10/12. Plan was to get a followup echocardiogram. This was never obtained. She now needs cataract surgery with Dr. Mia Simmons. She denies chest discomfort. She denies syncope. She can probably achieve 4 METs without chest pain or shortness of breath. She probably describes class IIb symptoms. She denies orthopnea or PND. She denies significant pedal edema.   she has done fairly well since I last saw her a year or so ago. She is recently saw Kaitlyn Simmons for preop clearance for cataract surgery.  She exercises on occasion. She has occasional chest twinge but it only last for a second or so.  She's not been able to lose any weight.   Current Outpatient Prescriptions on File Prior to Visit  Medication Sig Dispense Refill  . acetaminophen (TYLENOL) 325 MG tablet Take 650 mg by mouth as needed.        Marland Kitchen aspirin 81 MG tablet Take 81 mg by mouth daily.        Marland Kitchen atorvastatin (LIPITOR) 80 MG tablet Take 80 mg by mouth daily.        . clopidogrel (PLAVIX) 75 MG tablet Take 1 tablet (75 mg total) by mouth daily.  90 tablet  3  . furosemide (LASIX) 40 MG tablet Take 1 tablet (40 mg total) by mouth daily.  90 tablet  2  . nitroGLYCERIN  (NITROSTAT) 0.4 MG SL tablet Place 1 tablet (0.4 mg total) under the tongue every 5 (five) minutes as needed.  25 tablet  3  . pantoprazole (PROTONIX) 40 MG tablet Take 40 mg by mouth daily.        . [DISCONTINUED] carvedilol (COREG) 25 MG tablet Take 1 tablet (25 mg total) by mouth 2 (two) times daily.  60 tablet  11    No Known Allergies  Past Medical History  Diagnosis Date  . CAD (coronary artery disease)     a. s/p IL STEMI 7/11 => tx with BMS to RCA (tx at [facility]);  b.  AL STEMI 7/12 => LHC: mRCA 100% ==> PCI with BMS to RCA; LM 10%, pCFX 50%, OM1 10%, pLAD 50%, mLAD 99%, 70%  ==>  c. staged PCI: BMS to  LAD (tx at [facility])  . Chronic systolic heart failure   . HTN (hypertension)   . Hyperlipidemia   . Ischemic cardiomyopathy     a. EF 30-35% at time of AL STEMI 7/12;   b. IMPROVED by f/u Echo 9/13:  mild LVH, EF 45-50%, mid to dist Inf, AS, Apical HK, Gr 1 diast dysfn, MAC, mod LAE  . Cataracts, bilateral  Past Surgical History  Procedure Date  . Coronary angioplasty with stent placement 2011    RCA  . Dilation and curettage of uterus   . Coronary stent placement 2012    RCA & LAD    History  Smoking status  . Former Smoker  . Quit date: 10/25/2002  Smokeless tobacco  . Not on file    History  Alcohol Use No    Family History  Problem Relation Age of Onset  . Heart attack Father     DECEASED  . Stroke Father     DECEASED  . Leukemia Mother     DECEASED  . Heart attack Sister     ALIVE  . Hypertension Sister     Reviw of Systems:  Reviewed in the HPI.  All other systems are negative.  Physical Exam: Blood pressure 136/90, pulse 85, height 5\' 5"  (1.651 m), weight 256 lb (116.121 kg). General: Well developed, well nourished, in no acute distress.  Head: Normocephalic, atraumatic, sclera non-icteric, mucus membranes are moist,   Neck: Supple. Carotids are 2 + without bruits. No JVD  Lungs: Clear bilaterally to auscultation.  Heart: regular rate.   normal  S1 S2. No murmurs, gallops or rubs.  Abdomen: Soft, non-tender, non-distended with normal bowel sounds. No hepatomegaly. No rebound/guarding. No masses.  Msk:  Strength and tone are normal  Extremities: No clubbing or cyanosis. No edema.  Distal pedal pulses are 2+ and equal bilaterally.  Neuro: Alert and oriented X 3. Moves all extremities spontaneously.  Psych:  Responds to questions appropriately with a normal affect.  ECG:  Assessment / Plan:

## 2012-01-04 NOTE — Assessment & Plan Note (Signed)
Kaitlyn Simmons is doing  well. She has had some shortness breath but I think this is more due to her obesity. Her congestive heart failure seems to be well-controlled. We will continue with her same medications. We'll consider getting an echocardiogram at her next visit.

## 2012-02-04 ENCOUNTER — Other Ambulatory Visit: Payer: Self-pay | Admitting: *Deleted

## 2012-02-04 MED ORDER — CARVEDILOL 25 MG PO TABS: 25.0000 mg | ORAL_TABLET | Freq: Two times a day (BID) | ORAL | Status: DC

## 2012-02-04 NOTE — Telephone Encounter (Signed)
Fax Received. Refill Completed. Kaitlyn Simmons (R.M.A)   

## 2012-09-11 ENCOUNTER — Inpatient Hospital Stay (HOSPITAL_COMMUNITY)
Admission: EM | Admit: 2012-09-11 | Discharge: 2012-09-13 | DRG: 442 | Disposition: A | Discharge status: Home / Self Care | Payer: Medicare Other | Source: Other Acute Inpatient Hospital | Attending: Cardiology | Admitting: Cardiology

## 2012-09-11 ENCOUNTER — Emergency Department: Payer: Self-pay | Admitting: Internal Medicine

## 2012-09-11 DIAGNOSIS — I2510 Atherosclerotic heart disease of native coronary artery without angina pectoris: Secondary | ICD-10-CM | POA: Diagnosis present

## 2012-09-11 DIAGNOSIS — I10 Essential (primary) hypertension: Secondary | ICD-10-CM | POA: Diagnosis present

## 2012-09-11 DIAGNOSIS — Z9861 Coronary angioplasty status: Secondary | ICD-10-CM

## 2012-09-11 DIAGNOSIS — K8020 Calculus of gallbladder without cholecystitis without obstruction: Secondary | ICD-10-CM | POA: Diagnosis present

## 2012-09-11 DIAGNOSIS — I255 Ischemic cardiomyopathy: Secondary | ICD-10-CM | POA: Diagnosis present

## 2012-09-11 DIAGNOSIS — R1013 Epigastric pain: Secondary | ICD-10-CM | POA: Diagnosis present

## 2012-09-11 DIAGNOSIS — I214 Non-ST elevation (NSTEMI) myocardial infarction: Secondary | ICD-10-CM | POA: Insufficient documentation

## 2012-09-11 DIAGNOSIS — K7200 Acute and subacute hepatic failure without coma: Principal | ICD-10-CM | POA: Diagnosis present

## 2012-09-11 DIAGNOSIS — Z87891 Personal history of nicotine dependence: Secondary | ICD-10-CM

## 2012-09-11 DIAGNOSIS — E785 Hyperlipidemia, unspecified: Secondary | ICD-10-CM | POA: Diagnosis present

## 2012-09-11 DIAGNOSIS — R945 Abnormal results of liver function studies: Secondary | ICD-10-CM

## 2012-09-11 DIAGNOSIS — I252 Old myocardial infarction: Secondary | ICD-10-CM

## 2012-09-11 DIAGNOSIS — I219 Acute myocardial infarction, unspecified: Secondary | ICD-10-CM

## 2012-09-11 DIAGNOSIS — I2589 Other forms of chronic ischemic heart disease: Secondary | ICD-10-CM | POA: Diagnosis present

## 2012-09-11 DIAGNOSIS — I5022 Chronic systolic (congestive) heart failure: Secondary | ICD-10-CM | POA: Diagnosis present

## 2012-09-11 DIAGNOSIS — F0390 Unspecified dementia without behavioral disturbance: Secondary | ICD-10-CM | POA: Diagnosis present

## 2012-09-11 DIAGNOSIS — R7989 Other specified abnormal findings of blood chemistry: Secondary | ICD-10-CM | POA: Diagnosis present

## 2012-09-11 HISTORY — DX: Gastro-esophageal reflux disease without esophagitis: K21.9

## 2012-09-11 HISTORY — DX: ST elevation (STEMI) myocardial infarction of unspecified site: I21.3

## 2012-09-11 LAB — BASIC METABOLIC PANEL
Anion Gap: 6 — ABNORMAL LOW (ref 7–16)
BUN: 18 mg/dL (ref 7–18)
Co2: 27 mmol/L (ref 21–32)
Creatinine: 1.24 mg/dL (ref 0.60–1.30)
EGFR (Non-African Amer.): 45 — ABNORMAL LOW
Glucose: 115 mg/dL — ABNORMAL HIGH (ref 65–99)
Osmolality: 280 (ref 275–301)
Potassium: 3.7 mmol/L (ref 3.5–5.1)
Sodium: 139 mmol/L (ref 136–145)

## 2012-09-11 LAB — CBC
HGB: 12.8 g/dL (ref 12.0–16.0)
MCV: 89 fL (ref 80–100)
Platelet: 200 10*3/uL (ref 150–440)
RDW: 14.5 % (ref 11.5–14.5)
WBC: 11.7 10*3/uL — ABNORMAL HIGH (ref 3.6–11.0)

## 2012-09-11 LAB — TROPONIN I: Troponin-I: 0.09 ng/mL — ABNORMAL HIGH

## 2012-09-11 LAB — CK TOTAL AND CKMB (NOT AT ARMC): CK, Total: 68 U/L (ref 21–215)

## 2012-09-11 LAB — PROTIME-INR: Prothrombin Time: 13.9 secs (ref 11.5–14.7)

## 2012-09-11 MED ORDER — ASPIRIN EC 81 MG PO TBEC
81.0000 mg | DELAYED_RELEASE_TABLET | Freq: Every day | ORAL | Status: DC
Start: 2012-09-13 — End: 2012-09-13
  Administered 2012-09-13: 81 mg via ORAL
  Filled 2012-09-11: qty 1

## 2012-09-11 MED ORDER — ATORVASTATIN CALCIUM 80 MG PO TABS
80.0000 mg | ORAL_TABLET | Freq: Every day | ORAL | Status: DC
  Filled 2012-09-11: qty 1

## 2012-09-11 MED ORDER — ASPIRIN EC 81 MG PO TBEC: 81.0000 mg | DELAYED_RELEASE_TABLET | Freq: Every day | ORAL | Status: DC

## 2012-09-11 MED ORDER — SODIUM CHLORIDE 0.9 % IJ SOLN
3.0000 mL | Freq: Two times a day (BID) | INTRAMUSCULAR | Status: DC
  Administered 2012-09-12: 3 mL via INTRAVENOUS

## 2012-09-11 MED ORDER — SODIUM CHLORIDE 0.9 % IV SOLN: INTRAVENOUS | Status: DC

## 2012-09-11 MED ORDER — ONDANSETRON HCL 4 MG/2ML IJ SOLN: 4.0000 mg | Freq: Four times a day (QID) | INTRAMUSCULAR | Status: DC | PRN

## 2012-09-11 MED ORDER — SODIUM CHLORIDE 0.9 % IJ SOLN: 3.0000 mL | INTRAMUSCULAR | Status: DC | PRN

## 2012-09-11 MED ORDER — SODIUM CHLORIDE 0.9 % IJ SOLN
3.0000 mL | Freq: Two times a day (BID) | INTRAMUSCULAR | Status: DC
Start: 2012-09-11 — End: 2012-09-13
  Administered 2012-09-11 – 2012-09-12 (×2): 3 mL via INTRAVENOUS

## 2012-09-11 MED ORDER — NITROGLYCERIN 0.4 MG SL SUBL: 0.4000 mg | SUBLINGUAL_TABLET | SUBLINGUAL | Status: DC | PRN

## 2012-09-11 MED ORDER — ACETAMINOPHEN 325 MG PO TABS: 650.0000 mg | ORAL_TABLET | ORAL | Status: DC | PRN

## 2012-09-11 MED ORDER — CLOPIDOGREL BISULFATE 75 MG PO TABS
75.0000 mg | ORAL_TABLET | Freq: Every day | ORAL | Status: DC
  Administered 2012-09-12 – 2012-09-13 (×2): 75 mg via ORAL
  Filled 2012-09-11 (×2): qty 1

## 2012-09-11 MED ORDER — ZOLPIDEM TARTRATE 5 MG PO TABS: 5.0000 mg | ORAL_TABLET | Freq: Every evening | ORAL | Status: DC | PRN

## 2012-09-11 MED ORDER — LISINOPRIL 10 MG PO TABS
10.0000 mg | ORAL_TABLET | Freq: Every day | ORAL | Status: DC
  Administered 2012-09-12 – 2012-09-13 (×2): 10 mg via ORAL
  Filled 2012-09-11 (×2): qty 1

## 2012-09-11 MED ORDER — SODIUM CHLORIDE 0.9 % IV SOLN: 250.0000 mL | INTRAVENOUS | Status: DC | PRN

## 2012-09-11 MED ORDER — ASPIRIN 81 MG PO TABS: 81.0000 mg | ORAL_TABLET | Freq: Every day | ORAL | Status: DC

## 2012-09-11 MED ORDER — HEPARIN (PORCINE) IN NACL 100-0.45 UNIT/ML-% IJ SOLN
1400.0000 [IU]/h | INTRAMUSCULAR | Status: DC
  Administered 2012-09-11: 1000 [IU]/h via INTRAVENOUS
  Filled 2012-09-11 (×2): qty 250

## 2012-09-11 MED ORDER — PANTOPRAZOLE SODIUM 40 MG PO TBEC
40.0000 mg | DELAYED_RELEASE_TABLET | Freq: Every day | ORAL | Status: DC
  Administered 2012-09-12 – 2012-09-13 (×2): 40 mg via ORAL
  Filled 2012-09-11 (×2): qty 1

## 2012-09-11 MED ORDER — ASPIRIN 81 MG PO CHEW
324.0000 mg | CHEWABLE_TABLET | ORAL | Status: AC
  Administered 2012-09-12: 324 mg via ORAL
  Filled 2012-09-11: qty 4

## 2012-09-11 MED ORDER — ALPRAZOLAM 0.25 MG PO TABS: 0.2500 mg | ORAL_TABLET | Freq: Two times a day (BID) | ORAL | Status: DC | PRN

## 2012-09-11 MED ORDER — CARVEDILOL 25 MG PO TABS
25.0000 mg | ORAL_TABLET | Freq: Two times a day (BID) | ORAL | Status: DC
  Administered 2012-09-11 – 2012-09-13 (×3): 25 mg via ORAL
  Filled 2012-09-11 (×6): qty 1

## 2012-09-11 MED ORDER — DIAZEPAM 5 MG PO TABS: 5.0000 mg | ORAL_TABLET | ORAL | Status: AC

## 2012-09-11 MED ORDER — FUROSEMIDE 40 MG PO TABS
40.0000 mg | ORAL_TABLET | Freq: Every day | ORAL | Status: DC
  Administered 2012-09-12 – 2012-09-13 (×2): 40 mg via ORAL
  Filled 2012-09-11 (×2): qty 1

## 2012-09-11 NOTE — H&P (Signed)
CARDIOLOGY history and physical   Patient ID: Kaitlyn Simmons MRN: [id number] DOB/AGE: Nov 03, 1945 67 y.o.  Admit date: 09/11/2012 Primary PhysicianHAWKINS,JAMES H, MD Primary Cardiologist  Nahser Reason for Consultation  HPI: The patient has known coronary artery disease. She had chest discomfort with nausea today and went to the hospital at [facility]. She had some troponin elevation. Her pain resolved while on heparin. Decision was made for her to be transferred from the emergency room directly to a room here at [facility]. She arrives with no symptoms. She has poor short-term memory. Her daughter is here with her.  She is known to our team. She saw Dr. Elease Hashimoto last in November, 2013. She had been stable. There is a history of coronary disease with interventions. There is also a history of ischemic cardiomyopathy.   Past Medical History  Diagnosis Date  . CAD (coronary artery disease)     a. s/p IL STEMI 7/11 => tx with BMS to RCA (tx at [facility]);  b.  AL STEMI 7/12 => LHC: mRCA 100% ==> PCI with BMS to RCA; LM 10%, pCFX 50%, OM1 10%, pLAD 50%, mLAD 99%, 70%  ==>  c. staged PCI: BMS to  LAD (tx at [facility])  . Chronic systolic heart failure   . HTN (hypertension)   . Hyperlipidemia   . Ischemic cardiomyopathy     a. EF 30-35% at time of AL STEMI 7/12;   b. IMPROVED by f/u Echo 9/13:  mild LVH, EF 45-50%, mid to dist Inf, AS, Apical HK, Gr 1 diast dysfn, MAC, mod LAE  . Cataracts, bilateral     Family History  Problem Relation Age of Onset  . Heart attack Father     DECEASED  . Stroke Father     DECEASED  . Leukemia Mother     DECEASED  . Heart attack Sister     ALIVE  . Hypertension Sister     History   Social History  . Marital Status: Single    Spouse Name: N/A    Number of Children: N/A  . Years of Education: N/A   Occupational History  . Not on file.   Social History Main Topics  . Smoking status: Former Smoker    Quit date: 10/25/2002  . Smokeless tobacco: Not  on file  . Alcohol Use: No  . Drug Use: No  . Sexually Active:    Other Topics Concern  . Not on file   Social History Narrative  . No narrative on file    Past Surgical History  Procedure Laterality Date  . Coronary angioplasty with stent placement  2011    RCA  . Dilation and curettage of uterus    . Coronary stent placement  2012    RCA & LAD      Review of systems:    Patient denies fever, chills, headache, sweats, rash, change in vision, change in hearing, cough, nausea vomiting, urinary symptoms. All other systems are reviewed and are negative.  Physical Exam: Blood pressure 104/41, pulse 47, temperature 97.8 F (36.6 C), temperature source Oral, height 5\' 5"  (1.651 m), weight 237 lb 4.8 oz (107.639 kg), SpO2 100.00%.  The patient is oriented to person time and place. She does have poor short-term memory. I was talking with her while her daughter was in the room. At one point she could not remember her chest pain earlier today. However she remembered it later. Her daughter was very clear about the pain. She  is overweight. There is no jugulovenous distention. Lungs are clear. Respiratory effort is nonlabored. Cardiac exam reveals S1 and S2. There no clicks or significant murmurs. The abdomen is soft. There is no peripheral edema.  Labs:  Hemoglobin is 12.8. BUN is 18 with a creatinine 1.24. Potassium is 3.7. Troponin was 0.09.     Radiology: Chest x-ray report from [facility] revealed no CHF. There was cardiomegaly.  EKG  I have reviewed the EKG from 3:59 PM today. There is sinus bradycardia. There is an old inferior MI and old anterior MI. There is slight ST elevation in the inferior leads. There is no change from the prior tracing in 2013.  ASSESSMENT AND PLAN:     CHF (congestive heart failure)    Historically she has congestive heart failure. There is no sign of volume overload at this time.    HTN (hypertension)   Hyperlipidemia    Ischemic cardiomyopathy      There is history of decreased ejection fraction in the past. Most recent echo in September, 2013 revealed an EF of 45%.    Non-STEMI (non-ST elevated myocardial infarction)     The patient's history today is consistent with a non-STEMI. She had chest pain and nausea. It resolved and she has mild troponin elevation with her first troponin. The patient was transferred here specifically to be admitted for stabilization and catheterization tomorrow. Orders are being written. I have listed the patient on the cath board. Her creatinine is 1.24. This is actually in her usual baseline range. I will lower her another chemistry lab for tomorrow morning.    CAD (coronary artery disease)    The patient has had interventions in the past.  Jerral Bonito, MD

## 2012-09-11 NOTE — Progress Notes (Addendum)
ANTICOAGULATION CONSULT NOTE - Initial Consult  Pharmacy Consult for heparin Indication: chest pain/ACS  No Known Allergies  Patient Measurements: Height: 5\' 5"  (165.1 cm) Weight: 237 lb 4.8 oz (107.639 kg) IBW/kg (Calculated) : 57 Heparin Dosing Weight: 82 kg  Vital Signs: Temp: 97.8 F (36.6 C) (07/17 1956) Temp src: Oral (07/17 1956) BP: 104/41 mmHg (07/17 1956) Pulse Rate: 47 (07/17 1956)  Labs: No results found for this basename: HGB, HCT, PLT, APTT, LABPROT, INR, HEPARINUNFRC, CREATININE, CKTOTAL, CKMB, TROPONINI,  in the last 72 hours  Estimated Creatinine Clearance: 50.5 ml/min (by C-G formula based on Cr of 1.3).   Medical History: Past Medical History  Diagnosis Date  . CAD (coronary artery disease)     a. s/p IL STEMI 7/11 => tx with BMS to RCA (tx at [facility]);  b.  AL STEMI 7/12 => LHC: mRCA 100% ==> PCI with BMS to RCA; LM 10%, pCFX 50%, OM1 10%, pLAD 50%, mLAD 99%, 70%  ==>  c. staged PCI: BMS to  LAD (tx at [facility])  . Chronic systolic heart failure   . HTN (hypertension)   . Hyperlipidemia   . Ischemic cardiomyopathy     a. EF 30-35% at time of AL STEMI 7/12;   b. IMPROVED by f/u Echo 9/13:  mild LVH, EF 45-50%, mid to dist Inf, AS, Apical HK, Gr 1 diast dysfn, MAC, mod LAE  . Cataracts, bilateral     Medications:  Prescriptions prior to admission  Medication Sig Dispense Refill  . aspirin 81 MG tablet Take 81 mg by mouth daily.        Marland Kitchen atorvastatin (LIPITOR) 80 MG tablet Take 80 mg by mouth daily.        . carvedilol (COREG) 25 MG tablet Take 1 tablet (25 mg total) by mouth 2 (two) times daily with a meal.  60 tablet  5  . clopidogrel (PLAVIX) 75 MG tablet Take 1 tablet (75 mg total) by mouth daily.  90 tablet  3  . FLUoxetine (PROZAC) 10 MG capsule Take 10 mg by mouth daily.      . furosemide (LASIX) 40 MG tablet Take 1 tablet (40 mg total) by mouth daily.  90 tablet  2  . lisinopril (PRINIVIL,ZESTRIL) 10 MG tablet Take 1 tablet (10 mg total) by mouth  daily.  90 tablet  3  . nitroGLYCERIN (NITROSTAT) 0.4 MG SL tablet Place 0.4 mg under the tongue every 5 (five) minutes as needed for chest pain.      . pantoprazole (PROTONIX) 40 MG tablet Take 40 mg by mouth daily.          Assessment: 67 yo F transferred to [facility] from [facility] on heparin for chest pain.     Per Barbara Cower, RPh at [facility] she recived heparin 5000 unit bolus at 6pm and a drip was started at a rate of 15 units/kg/hr. At this time, patient has Heparin gtt from [place] in room but it has not been hanging since patient arrived.  INR 1.1 at [facility] today.  Goal of Therapy:  INR 2-3 Monitor platelets by anticoagulation protocol: Yes   Plan:  - Resume Heparin at 1000 units/hr - Check 8h heparin level and CBC - Check daily heparin level and CBC (first due 7/19)  Charyl Minervini L. Illene Bolus, PharmD, BCPS Clinical Pharmacist Pager: [phone] Pharmacy: [phone] 09/11/2012 8:13 PM

## 2012-09-12 ENCOUNTER — Encounter (HOSPITAL_COMMUNITY): Payer: Self-pay | Admitting: General Practice

## 2012-09-12 ENCOUNTER — Encounter (HOSPITAL_COMMUNITY)
Admission: EM | Disposition: A | Discharge status: Home / Self Care | Payer: Self-pay | Source: Other Acute Inpatient Hospital | Attending: Cardiology

## 2012-09-12 ENCOUNTER — Inpatient Hospital Stay (HOSPITAL_COMMUNITY): Payer: Medicare Other

## 2012-09-12 DIAGNOSIS — R7989 Other specified abnormal findings of blood chemistry: Secondary | ICD-10-CM

## 2012-09-12 DIAGNOSIS — I5022 Chronic systolic (congestive) heart failure: Secondary | ICD-10-CM

## 2012-09-12 DIAGNOSIS — I509 Heart failure, unspecified: Secondary | ICD-10-CM

## 2012-09-12 DIAGNOSIS — I2510 Atherosclerotic heart disease of native coronary artery without angina pectoris: Secondary | ICD-10-CM

## 2012-09-12 DIAGNOSIS — R1013 Epigastric pain: Secondary | ICD-10-CM | POA: Diagnosis present

## 2012-09-12 DIAGNOSIS — E785 Hyperlipidemia, unspecified: Secondary | ICD-10-CM

## 2012-09-12 DIAGNOSIS — R945 Abnormal results of liver function studies: Secondary | ICD-10-CM | POA: Diagnosis present

## 2012-09-12 LAB — COMPREHENSIVE METABOLIC PANEL
ALT: 171 U/L — ABNORMAL HIGH (ref 0–35)
AST: 205 U/L — ABNORMAL HIGH (ref 0–37)
Alkaline Phosphatase: 222 U/L — ABNORMAL HIGH (ref 39–117)
CO2: 26 mEq/L (ref 19–32)
Chloride: 104 mEq/L (ref 96–112)
GFR calc Af Amer: 53 mL/min — ABNORMAL LOW (ref >90)
GFR calc non Af Amer: 45 mL/min — ABNORMAL LOW (ref >90)
Glucose, Bld: 114 mg/dL — ABNORMAL HIGH (ref 70–99)
Sodium: 139 mEq/L (ref 135–145)
Total Bilirubin: 1.1 mg/dL (ref 0.3–1.2)

## 2012-09-12 LAB — CBC
HCT: 38.1 % (ref 36.0–46.0)
Hemoglobin: 12.7 g/dL (ref 12.0–15.0)
MCH: 29.7 pg (ref 26.0–34.0)
MCHC: 33.3 g/dL (ref 30.0–36.0)
MCV: 89.2 fL (ref 78.0–100.0)

## 2012-09-12 LAB — HEPARIN LEVEL (UNFRACTIONATED): Heparin Unfractionated: <0.1 IU/mL — ABNORMAL LOW (ref 0.30–0.70)

## 2012-09-12 LAB — LIPID PANEL
Cholesterol: 108 mg/dL (ref 0–200)
LDL Cholesterol: 52 mg/dL (ref 0–99)
Total CHOL/HDL Ratio: 2.6 RATIO
VLDL: 14 mg/dL (ref 0–40)

## 2012-09-12 LAB — TROPONIN I: Troponin I: <0.3 ng/mL (ref <0.30)

## 2012-09-12 SURGERY — LEFT HEART CATHETERIZATION WITH CORONARY ANGIOGRAM
Anesthesia: LOCAL

## 2012-09-12 MED ORDER — HEPARIN BOLUS VIA INFUSION
3000.0000 [IU] | Freq: Once | INTRAVENOUS | Status: AC
  Administered 2012-09-12: 3000 [IU] via INTRAVENOUS
  Filled 2012-09-12: qty 3000

## 2012-09-12 NOTE — Progress Notes (Signed)
Notification received that patient's telemetry leads were off.  Patient found lying in bed naked, with gown on floor, telemetry box on table, blood smeared on floor from bed to bathroom, and both IVs out.  Patient appears disoriented but in no acute distress.  Able to state her name and knows she is in the hospital.  States that she sometimes sleepwalks and does not remember getting up to the bathroom.  Source of bleeding is her left posterior hand at the IV insertion sites (where IV heparin had been infusing), now with ping-pong ball sized hematoma.  Patient states that she feels "shaky."  VSS.  CBG 127.  EKG with no significant changes.  Pressure dressing applied to left hand and telemetry monitor reapplied after bathing patient.  Bed alarm on.  Will continue to monitor.  Alonza Bogus

## 2012-09-12 NOTE — Progress Notes (Signed)
Patient Name: Kaitlyn Simmons Date of Encounter: 09/12/2012  Principal Problem:   Non-STEMI (non-ST elevated myocardial infarction) Active Problems:   CAD (coronary artery disease)   Abnormal LFTs (liver function tests)   HTN (hypertension)   Hyperlipidemia   Ischemic cardiomyopathy   CHF (congestive heart failure)    SUBJECTIVE: No chest pain, no SOB, resting comfortably. No history of liver problems. No recent illnesses or problems with N&V, diarrhea. Does not drink.  OBJECTIVE Filed Vitals:   09/11/12 1956 09/12/12 0035 09/12/12 0500  BP: 104/41 114/54 112/65  Pulse: 47 70 55  Temp: 97.8 F (36.6 C) 98.9 F (37.2 C) 97.9 F (36.6 C)  TempSrc: Oral Oral Oral  Resp:  20 18  Height: 5\' 5"  (1.651 m)    Weight: 237 lb 4.8 oz (107.639 kg)    SpO2: 100% 96% 98%   No intake or output data in the 24 hours ending 09/12/12 0737 Filed Weights   09/11/12 1956  Weight: 237 lb 4.8 oz (107.639 kg)    PHYSICAL EXAM General: Well developed, well nourished, female in no acute distress. Head: Normocephalic, atraumatic.  Neck: Supple without bruits, JVD not elevated. Lungs:  Resp regular and unlabored, CTA except a few rales bases. Heart: RRR, S1, S2, no S3, S4, 2/6 murmur; no rub. Abdomen: Soft, non-tender, non-distended, BS + x 4.  Extremities: No clubbing, cyanosis, trace edema.  Neuro: Alert and oriented X 3. Moves all extremities spontaneously. Psych: Normal affect.  LABS: CBC:  Recent Labs  09/12/12 0240  WBC 10.3  HGB 12.7  HCT 38.1  MCV 89.2  PLT 186   INR:  Recent Labs  09/11/12 2047  INR 1.10   Basic Metabolic Panel:  Recent Labs  16/10/96 0240  NA 139  K 3.7  CL 104  CO2 26  GLUCOSE 114*  BUN 16  CREATININE 1.20*  CALCIUM 9.6   Liver Function Tests:  Recent Labs  09/12/12 0240  AST 205*  ALT 171*  ALKPHOS 222*  BILITOT 1.1  PROT 6.9  ALBUMIN 3.6   Cardiac Enzymes:  Recent Labs  09/11/12 2047 09/12/12 0240  TROPONINI <0.30  <0.30   Fasting Lipid Panel:  Recent Labs  09/12/12 0240  CHOL 108  HDL 42  LDLCALC 52  TRIG 72  CHOLHDL 2.6    TELE: SR       ECG: 09/12/2012 Vent. rate 64 BPM PR interval 160 ms QRS duration 94 ms QT/QTc 384/396 ms P-R-T axes 9 6 95  Radiology/Studies: No results found.   Current Medications:  . [START ON 09/13/2012] aspirin EC  81 mg Oral Daily  . atorvastatin  80 mg Oral Daily  . carvedilol  25 mg Oral BID WC  . clopidogrel  75 mg Oral Q breakfast  . diazepam  5 mg Oral On Call  . furosemide  40 mg Oral Daily  . lisinopril  10 mg Oral Daily  . pantoprazole  40 mg Oral Daily  . sodium chloride  3 mL Intravenous Q12H  . sodium chloride  3 mL Intravenous Q12H   . sodium chloride 75 mL/hr at 09/12/12 0338  . heparin 1,400 Units/hr (09/12/12 0358)    ASSESSMENT AND PLAN: Principal Problem:  Active Problems:   CAD (coronary artery disease) - Hx stent RCA x 2, LAD stent    Abnormal LFTs (liver function tests) - no Hx, will order abd [place]   HTN (hypertension) - good control here   Hyperlipidemia - good control  Ischemic cardiomyopathy - EF 45% 10/2011 echo, recheck per MD   CHF (congestive heart failure) - volume status at baseline, follow.   Signed, Theodore Demark , PA-C 7:37 AM 09/12/2012   Attending Note:   The patient was seen and examined.  Agree with assessment and plan as noted above.  Changes made to the above note as needed.  1. Acute Hepatitis:  Kaitlyn Simmons was sent from [facility] last night for what was called CP with a + troponin. .   This am she pointed to her mid epigastrum as the location of her pain.  She c/o nausea for the past month.  Her troponin levels are NEGATIVE x 2 .  ECG shows old persistent ST elevation in the inferior leads ( better than ECG in 2012).  Her AST and ALT were normal in Sept. 2013 and are  Elevated now.  She has been on Atorvastatin for years - no change in dose.  I think that her diagnosis is really hepatitis and not ACS or  NSTEMI. We will DC heparin.  Cancel cath.  I have consulted Dr. Gershon Crane of  the [facility] team.    Further evaluation per Dr. Gershon Crane.   2. Chronic systolic CHF:  EF of 45-50% by echo in 2013.  Her EF has been as low as 30%  3. HTN:  Stable  4. Hyperlipidemia:  5. CAD:  No evidence of ACS at this time.  Cath has been cancelled.  6. Dementia:  She clearly has some short term memory problems.     Vesta Mixer, Montez Hageman., MD, [facility] 09/12/2012, 9:02 AM

## 2012-09-12 NOTE — Consult Note (Signed)
Medical Consultation   SWEDEN LESURE  XBJ:[id number]  DOB: May 07, 1945  DOA: 09/11/2012  PCP: Park Pope, MD  Requesting physician: Dr Elease Hashimoto  Reason for consultation: Elevated LFT's    History of Present Illness: Patient is a 67 year old female with history of known CAD, chronic CHF systolic, hypertension, hyperlipidemia had originally presented to [facility] with chest discomfort and nausea. Apparently troponin was 0.09 at [facility] and EKG changes hence patient was transferred to cardiology service for possible MI. Patient was awaiting cardiac cath today however on labs was noted to have transaminitis. On further questioning, patient reported she had epigastric pain which was intense,10/10 at the time of her presentation to [facility]. She reports having chronic off and on epigastric pain more than a year mostly after eating with nausea.  At the time of my encounter, patient had no pain, nausea or vomiting.   Allergies:  No Known Allergies    Past Medical History  Diagnosis Date  . CAD (coronary artery disease)     a. s/p IL STEMI 7/11 => tx with BMS to RCA (tx at [facility]);  b.  AL STEMI 7/12 => LHC: mRCA 100% ==> PCI with BMS to RCA; LM 10%, pCFX 50%, OM1 10%, pLAD 50%, mLAD 99%, 70%  ==>  c. staged PCI: BMS to  LAD (tx at [facility])  . Chronic systolic heart failure   . HTN (hypertension)   . Hyperlipidemia   . Ischemic cardiomyopathy     a. EF 30-35% at time of AL STEMI 7/12;   b. IMPROVED by f/u Echo 9/13:  mild LVH, EF 45-50%, mid to dist Inf, AS, Apical HK, Gr 1 diast dysfn, MAC, mod LAE  . Cataracts, bilateral     Past Surgical History  Procedure Laterality Date  . Coronary angioplasty with stent placement  2011    RCA  . Dilation and curettage of uterus    . Coronary stent placement  2012    RCA & LAD    Social History:  reports that she quit smoking about 9 years ago. She does not have any smokeless tobacco history on file. She  reports that she does not drink alcohol or use illicit drugs.  Family History  Problem Relation Age of Onset  . Heart attack Father     DECEASED  . Stroke Father     DECEASED  . Leukemia Mother     DECEASED  . Heart attack Sister     ALIVE  . Hypertension Sister     Review of Systems:  Review of Systems:  Constitutional: Denies fever, chills, diaphoresis, appetite change and fatigue.  HEENT: Denies photophobia, eye pain, redness, hearing loss, ear pain, congestion, sore throat, rhinorrhea, sneezing, mouth sores, trouble swallowing, neck pain, neck stiffness and tinnitus.   Respiratory: Denies SOB, DOE, cough, chest tightness,  and wheezing.   Cardiovascular: Denies chest pain, palpitations and leg swelling.  Gastrointestinal: Please see history of present illness Genitourinary: Denies dysuria, urgency, frequency, hematuria, flank pain and difficulty urinating.  Musculoskeletal: Denies myalgias, back pain, joint swelling, arthralgias and gait problem.  Skin: Denies pallor, rash and wound.  Neurological: Denies dizziness, seizures, syncope, weakness, light-headedness, numbness and headaches.  Hematological: Denies adenopathy. Easy bruising, personal or family bleeding history  Psychiatric/Behavioral: Denies suicidal ideation, mood changes, confusion, nervousness, sleep disturbance and agitation   Physical Exam: Blood pressure 112/65, pulse 55, temperature 97.9 F (36.6 C), temperature source Oral, resp. rate 18, height 5\' 5"  (1.651 m), weight 107.639  kg (237 lb 4.8 oz), SpO2 98.00%.  General: Alert and awake, oriented x3, not in any acute distress. HEENT: normocephalic, atraumatic, anicteric sclera, pupils reactive to light and accommodation, EOMI, oropharynx clear CVS: S1-S2 clear, no murmur rubs or gallops Chest: clear to auscultation bilaterally, no wheezing, rales or rhonchi Abdomen: soft nontender, nondistended, normal bowel sounds, no organomegaly Extremities: no cyanosis,  clubbing or edema noted bilaterally Neuro: Cranial nerves II-XII intact, no focal neurological deficits Psych: alert and oriented, stable mood and affect Skin: no rashes or lesions  Labs on Admission:  Basic Metabolic Panel:  Recent Labs Lab 09/12/12 0240  NA 139  K 3.7  CL 104  CO2 26  GLUCOSE 114*  BUN 16  CREATININE 1.20*  CALCIUM 9.6   Liver Function Tests:  Recent Labs Lab 09/12/12 0240  AST 205*  ALT 171*  ALKPHOS 222*  BILITOT 1.1  PROT 6.9  ALBUMIN 3.6   No results found for this basename: LIPASE, AMYLASE,  in the last 168 hours No results found for this basename: AMMONIA,  in the last 168 hours CBC:  Recent Labs Lab 09/12/12 0240  WBC 10.3  HGB 12.7  HCT 38.1  MCV 89.2  PLT 186   Cardiac Enzymes:  Recent Labs Lab 09/11/12 2047 09/12/12 0240  TROPONINI <0.30 <0.30   BNP: No components found with this basename: POCBNP,  CBG:  Recent Labs Lab 09/12/12 0046  GLUCAP 127*    Inpatient Medications:   Scheduled Meds: . [START ON 09/13/2012] aspirin EC  81 mg Oral Daily  . carvedilol  25 mg Oral BID WC  . clopidogrel  75 mg Oral Q breakfast  . diazepam  5 mg Oral On Call  . furosemide  40 mg Oral Daily  . lisinopril  10 mg Oral Daily  . pantoprazole  40 mg Oral Daily  . sodium chloride  3 mL Intravenous Q12H  . sodium chloride  3 mL Intravenous Q12H   Continuous Infusions: . sodium chloride 75 mL/hr at 09/12/12 0338  . heparin 1,400 Units/hr (09/12/12 0358)     Radiological Exams on Admission: No results found.  Impression/Recommendations Active Problems: Transaminitis with abdominal pain, epigastric: DDx include cholelithiasis/ cholecystitis, hepatitis, pancreatitis peptic ulcer disease with history of GERD, other possibilities could be chronic mesenteric ischemia or ischemic colitis. Troponins have been negative here and cardiology team doubts ACS.   - Hold off statins  - Obtain acute hepatitis panel, amylase/lipase, abdominal  ultrasound to rule out any acute cholecystitis - Continue PPI (patient only takes it off and on at home), no history of EGD in the past. If abdominal ultrasound is completely unremarkable, recommended GI consult for further evaluation.     CHF (congestive heart failure): Currently compensated, per cardiology team    HTN (hypertension): Currently stable, per cardiology team    Hyperlipidemia:  - Hold off statins due to transaminitis    Ischemic cardiomyopathy with  CAD (coronary artery disease) - Per cardiology team  [facility] hospitalist team will follow the patient with you. Thank you for this consultation.  Time Spent on Admission: 45 mins  Luisdaniel Kenton M.D. [facility] 09/12/2012, 9:35 AM

## 2012-09-12 NOTE — Progress Notes (Signed)
ANTICOAGULATION CONSULT NOTE - Follow Up Consult  Pharmacy Consult for heparin Indication: chest pain/ACS  Labs:  Recent Labs  09/11/12 2047 09/12/12 0240  HGB  --  12.7  HCT  --  38.1  PLT  --  186  LABPROT 14.0  --   INR 1.10  --   HEPARINUNFRC  --  <0.10*  CREATININE  --  1.20*  TROPONINI <0.30 <0.30    Assessment: 66yo female undetectable on heparin with initial dosing for CP.  Per RN pt pulled IV and heparin was off x85min a few hours prior to lab draw.  Goal of Therapy:  Heparin level 0.3-0.7 units/ml   Plan:  Will rebolus with heparin 3000 units x1 and increase gtt by 4 units/kg/hr to 1400 units/hr and check level in 6hr.  Vernard Gambles, PharmD, BCPS  09/12/2012,3:53 AM

## 2012-09-13 LAB — COMPREHENSIVE METABOLIC PANEL
BUN: 14 mg/dL (ref 6–23)
CO2: 22 mEq/L (ref 19–32)
Chloride: 105 mEq/L (ref 96–112)
Creatinine, Ser: 1.21 mg/dL — ABNORMAL HIGH (ref 0.50–1.10)
GFR calc non Af Amer: 45 mL/min — ABNORMAL LOW (ref >90)
Total Bilirubin: 1 mg/dL (ref 0.3–1.2)

## 2012-09-13 LAB — CBC
HCT: 35.8 % — ABNORMAL LOW (ref 36.0–46.0)
MCH: 29.9 pg (ref 26.0–34.0)
MCV: 90.9 fL (ref 78.0–100.0)
RBC: 3.94 MIL/uL (ref 3.87–5.11)
RDW: 14.6 % (ref 11.5–15.5)
WBC: 7.2 10*3/uL (ref 4.0–10.5)

## 2012-09-13 LAB — HEPATITIS PANEL, ACUTE
Hep A IgM: NEGATIVE
Hepatitis B Surface Ag: NEGATIVE

## 2012-09-13 NOTE — Discharge Summary (Signed)
CARDIOLOGY DISCHARGE SUMMARY    Patient ID: Kaitlyn Simmons,  MRN: [id number], DOB/AGE: Jun 08, 1945 67 y.o.  Admit date: 09/11/2012 Discharge date: 09/13/2012  Primary Care Physician: Kaitlyn Maples, MD Primary Cardiologist: Kaitlyn Hashimoto, MD  Primary Discharge Diagnosis:  1. Acute transaminitis with epigastric pain  Per Dr. Elvera Simmons with Internal Medicine -  - LFTs improving today, patient without any pain and tolerating food well  - US shows cholelithiasis without cholecystitis and normal CBD  - she will need to see Simmons for elective cholecystectomy since she has chronic symptoms of epigastric pain nausea relating to foods.  - called Kaitlyn Simmons this morning, I am unable to arrange for an appointment over the weekend but will call on Monday to set that up for her.   Secondary Discharge Diagnoses:  1. Chronic systolic HF 2. HTN 3. Dyslipidemia 4. CAD 5. Dementia  Procedures This Admission:  1. [place] of abdomen Findings: - Gallbladder: Cholelithiasis is present. Largest gallstone measures 19 mm. No sonographic Murphy's sign or findings of acute cholecystitis. Gallbladder Kaitlyn Simmons is within normal limits. - Common bile duct: 6 mm, normal. - Liver: No focal lesion identified. Within normal limits in parenchymal echogenicity. - IVC: Appears normal. - Pancreas: No focal abnormality seen. - Spleen: 8.8 cm. Normal echotexture. - Right Kidney: 10.9 cm. Normal echotexture. Normal central sinus echo complex. No calculi or hydronephrosis. - Left Kidney: 11.2 cm. Normal echotexture. Normal central sinus echo complex. No calculi or hydronephrosis. - Abdominal aorta: Abdominal aortic mural calcification. No aneurysm. 2.5 cm. Suboptimal visualization due to overlying bowel gas. IMPRESSION: 1. Cholelithiasis without cholecystitis. 2. Normal appearance of the liver and common bile duct for age.  History and Hospital Course:  Per Dr. Harvie Simmons note, Kaitlyn Simmons was sent from [facility] last night for  what was initially considered CP with a +troponin. However, here she pointed to her mid epigastrum as the location of her pain. She reported persistent nausea for the past month. Her troponin levels were negative x 2. ECG shows old persistent ST elevation in the inferior leads (improved when compared to prior ECG in 2012). Her AST and ALT were normal in Sept 2013 and are elevated now. She has been on Atorvastatin for years - no change in dose. It was felt this represented acute hepatitis / transaminitis, not ACS or NSTEMI. Dr. Isidoro Simmons of the [facility] team was consulted. An abdominal US was obtained with results as outlined above. Dr. Elvera Simmons felt she was stable for discharge and recommended she see Simmons as an outpatient to consider elective cholecystectomy. Please see recommendations outlined above. Her LFTs are improving and she is without pain and tolerating food well. She has been seen, examined and deemed stable for discharge today by Dr. Valera Castle. Dr. Elvera Simmons is arranging follow-up with Kaitlyn Simmons.   Discharge Vitals: Blood pressure 96/52, pulse 56, temperature 97.9 F (36.6 C), temperature source Oral, resp. rate 18, height 5\' 5"  (1.651 m), weight 233 lb 1.6 oz (105.733 kg), SpO2 95.00%.   Labs: Lab Results  Component Value Date   WBC 7.2 09/13/2012   HGB 11.8* 09/13/2012   HCT 35.8* 09/13/2012   MCV 90.9 09/13/2012   PLT 161 09/13/2012     Recent Labs Lab 09/13/12 0647  NA 139  K 3.7  CL 105  CO2 22  BUN 14  CREATININE 1.21*  CALCIUM 9.1  PROT 6.5  BILITOT 1.0  ALKPHOS 188*  ALT 143*  AST 96*  GLUCOSE 91   Lab Results  Component Value Date   TROPONINI <0.30 09/12/2012    Lab Results  Component Value Date   CHOL 108 09/12/2012   CHOL 138 10/23/2010   Lab Results  Component Value Date   HDL 42 09/12/2012   HDL 37.80* 10/23/2010   Lab Results  Component Value Date   LDLCALC 52 09/12/2012   LDLCALC 68 10/23/2010   Lab Results  Component Value Date   TRIG  72 09/12/2012   TRIG 161.0* 10/23/2010   Lab Results  Component Value Date   CHOLHDL 2.6 09/12/2012   CHOLHDL 4 10/23/2010    Recent Labs  09/11/12 2047  INR 1.10    Disposition:  The patient is being discharged in stable condition.  Follow-up:     Follow-up Information   Follow up with [facility] Schedule an appointment as soon as possible for a visit in 1 week.   Contact information:   [address] [place] [address] [phone]     Discharge Medications:    Medication List         aspirin 81 MG tablet  Take 81 mg by mouth daily.     atorvastatin 80 MG tablet  Commonly known as:  LIPITOR  Take 80 mg by mouth daily.     carvedilol 25 MG tablet  Commonly known as:  COREG  Take 1 tablet (25 mg total) by mouth 2 (two) times daily with a meal.     clopidogrel 75 MG tablet  Commonly known as:  PLAVIX  Take 1 tablet (75 mg total) by mouth daily.     FLUoxetine 10 MG capsule  Commonly known as:  PROZAC  Take 10 mg by mouth daily.     furosemide 40 MG tablet  Commonly known as:  LASIX  Take 1 tablet (40 mg total) by mouth daily.     lisinopril 10 MG tablet  Commonly known as:  PRINIVIL,ZESTRIL  Take 1 tablet (10 mg total) by mouth daily.     nitroGLYCERIN 0.4 MG SL tablet  Commonly known as:  NITROSTAT  Place 0.4 mg under the tongue every 5 (five) minutes as needed for chest pain.     pantoprazole 40 MG tablet  Commonly known as:  PROTONIX  Take 40 mg by mouth daily.       Duration of Discharge Encounter: Greater than 30 minutes including physician time.  Signed, Kaitlyn Duff, PA-C 09/13/2012, 2:59 PM Kaitlyn Simmons. Daleen Squibb, MD, [facility] Pager:  [phone]

## 2012-09-13 NOTE — Progress Notes (Signed)
Patient ID: JULIEANA ESHLEMAN, female   DOB: [birth year], 67 y.o.   MRN: [id number]   Patient Name: STEPHANEY STEVEN Date of Encounter: 09/13/2012    SUBJECTIVE  No further lower chest or abdominal pain. LFTs are coming down. Ultrasound consistent with cholelithiasis but not cholecystitis. White count is normal. Wants to go home.  CURRENT MEDS . aspirin EC  81 mg Oral Daily  . carvedilol  25 mg Oral BID WC  . clopidogrel  75 mg Oral Q breakfast  . furosemide  40 mg Oral Daily  . lisinopril  10 mg Oral Daily  . pantoprazole  40 mg Oral Daily  . sodium chloride  3 mL Intravenous Q12H  . sodium chloride  3 mL Intravenous Q12H    OBJECTIVE  Filed Vitals:   09/12/12 0500 09/12/12 1421 09/12/12 2207 09/13/12 0600  BP: 112/65 91/47 99/55  109/64  Pulse: 55 61 54 81  Temp: 97.9 F (36.6 C) 97.6 F (36.4 C) 97.7 F (36.5 C) 98.8 F (37.1 C)  TempSrc: Oral  Oral Oral  Resp: 18 18 20 20   Height:      Weight:    233 lb 1.6 oz (105.733 kg)  SpO2: 98% 98% 97% 98%    Intake/Output Summary (Last 24 hours) at 09/13/12 0942 Last data filed at 09/13/12 0936  Gross per 24 hour  Intake    240 ml  Output      0 ml  Net    240 ml   Filed Weights   09/11/12 1956 09/13/12 0600  Weight: 237 lb 4.8 oz (107.639 kg) 233 lb 1.6 oz (105.733 kg)    PHYSICAL EXAM  General: Pleasant, NAD. Morbidly obese Neuro: Alert and oriented X 3. Moves all extremities spontaneously. Psych: Normal affect. HEENT:  Normal  Neck: Supple without bruits or JVD. Lungs:  Resp regular and unlabored, CTA. Heart: RRR no s3, s4, or murmurs. Abdomen: Soft, non-tender, non-distended, BS + x 4.  Extremities: No clubbing, cyanosis or edema. DP/PT/Radials 2+ and equal bilaterally.  Accessory Clinical Findings  CBC  Recent Labs  09/12/12 0240 09/13/12 0647  WBC 10.3 7.2  HGB 12.7 11.8*  HCT 38.1 35.8*  MCV 89.2 90.9  PLT 186 161   Basic Metabolic Panel  Recent Labs  09/12/12 0240 09/13/12 0647  NA 139 139   K 3.7 3.7  CL 104 105  CO2 26 22  GLUCOSE 114* 91  BUN 16 14  CREATININE 1.20* 1.21*  CALCIUM 9.6 9.1   Liver Function Tests  Recent Labs  09/12/12 0240 09/13/12 0647  AST 205* 96*  ALT 171* 143*  ALKPHOS 222* 188*  BILITOT 1.1 1.0  PROT 6.9 6.5  ALBUMIN 3.6 3.4*    Recent Labs  09/12/12 1016  LIPASE 37  AMYLASE 40   Cardiac Enzymes  Recent Labs  09/11/12 2047 09/12/12 0240 09/12/12 0905  TROPONINI <0.30 <0.30 <0.30   BNP No components found with this basename: POCBNP,  D-Dimer No results found for this basename: DDIMER,  in the last 72 hours Hemoglobin A1C No results found for this basename: HGBA1C,  in the last 72 hours Fasting Lipid Panel  Recent Labs  09/12/12 0240  CHOL 108  HDL 42  LDLCALC 52  TRIG 72  CHOLHDL 2.6   Thyroid Function Tests No results found for this basename: TSH, T4TOTAL, FREET3, T3FREE, THYROIDAB,  in the last 72 hours  TELE  Normal sinus rhythm  ECG   Radiology/Studies  US Abdomen Complete  09/12/2012   *RADIOLOGY REPORT*  Clinical Data:  Abnormal liver function tests.  COMPLETE ABDOMINAL ULTRASOUND  Comparison:  None.  Findings:  Gallbladder:  Cholelithiasis is present.  Largest gallstone measures 19 mm.  No sonographic Murphy's sign or findings of acute cholecystitis.  Gallbladder Barry [place] is within normal limits.  Common bile duct:  6 mm, normal.  Liver:  No focal lesion identified.  Within normal limits in parenchymal echogenicity.  IVC:  Appears normal.  Pancreas:  No focal abnormality seen.  Spleen:  8.8 cm.  Normal echotexture.  Right Kidney:  10.9 cm. Normal echotexture.  Normal central sinus echo complex.  No calculi or hydronephrosis.  Left Kidney:  11.2 cm. Normal echotexture.  Normal central sinus echo complex.  No calculi or hydronephrosis.  Abdominal aorta:  Abdominal aortic mural calcification.  No aneurysm.  2.5 cm.  Suboptimal visualization due to overlying bowel gas.  IMPRESSION:  1.  Cholelithiasis without  cholecystitis. 2.  Normal appearance of the liver and common bile duct for age.   Original Report Authenticated By: Andreas Newport, M.D.    ASSESSMENT AND PLAN  Active Problems:   CHF (congestive heart failure)   HTN (hypertension)   Hyperlipidemia   Ischemic cardiomyopathy   CAD (coronary artery disease)   Abnormal LFTs (liver function tests)   Abdominal pain, epigastric    And we'll discharge today.  Dr Lafe Garin will arrange elective surgical consultation this week.  Signed, Valera Castle MD

## 2012-09-13 NOTE — Progress Notes (Signed)
[facility] PROGRESS NOTE  Kaitlyn Simmons ZOX:[id number] DOB: [birth year] DOA: 09/11/2012 PCP: Park Pope, MD  Assessment/Plan:  Elevated liver enzymes with epigastric pain - LFs improving today, patient without any pain tolerating food well,  - US shows cholelithiasis without cholecystitis and normal CBD - she will need to see surgery for elective cholecystectomy since she has chronic symptoms of epigastric pain nausea relationship to foods.  - called [facility] this morning, I am unable to arrange for an appointment over the weekend but will call on Monday to set that up for her. Will also provide information in the DC Instructions  HPI/Subjective: - feeling great, able to eat, denies epigastric pain, no nausea.  Objective: Filed Vitals:   09/12/12 0500 09/12/12 1421 09/12/12 2207 09/13/12 0600  BP: 112/65 91/47 99/55  109/64  Pulse: 55 61 54 81  Temp: 97.9 F (36.6 C) 97.6 F (36.4 C) 97.7 F (36.5 C) 98.8 F (37.1 C)  TempSrc: Oral  Oral Oral  Resp: 18 18 20 20   Height:      Weight:    105.733 kg (233 lb 1.6 oz)  SpO2: 98% 98% 97% 98%    Intake/Output Summary (Last 24 hours) at 09/13/12 1024 Last data filed at 09/13/12 0936  Gross per 24 hour  Intake    240 ml  Output      0 ml  Net    240 ml   Filed Weights   09/11/12 1956 09/13/12 0600  Weight: 107.639 kg (237 lb 4.8 oz) 105.733 kg (233 lb 1.6 oz)    Exam:   General:  NAD  Cardiovascular: regular rate and rhythm, without MRG  Respiratory: good air movement, clear to auscultation throughout, no wheezing, ronchi or rales  Abdomen: soft, not tender to palpation, positive bowel sounds  MSK: no peripheral edema  Neuro: CN 2-12 grossly intact, MS 5/5 in all 4  Data Reviewed: Basic Metabolic Panel:  Recent Labs Lab 09/12/12 0240 09/13/12 0647  NA 139 139  K 3.7 3.7  CL 104 105  CO2 26 22  GLUCOSE 114* 91  BUN 16 14  CREATININE 1.20* 1.21*  CALCIUM 9.6 9.1   Liver Function  Tests:  Recent Labs Lab 09/12/12 0240 09/13/12 0647  AST 205* 96*  ALT 171* 143*  ALKPHOS 222* 188*  BILITOT 1.1 1.0  PROT 6.9 6.5  ALBUMIN 3.6 3.4*    Recent Labs Lab 09/12/12 1016  LIPASE 37  AMYLASE 40   CBC:  Recent Labs Lab 09/12/12 0240 09/13/12 0647  WBC 10.3 7.2  HGB 12.7 11.8*  HCT 38.1 35.8*  MCV 89.2 90.9  PLT 186 161   Cardiac Enzymes:  Recent Labs Lab 09/11/12 2047 09/12/12 0240 09/12/12 0905  TROPONINI <0.30 <0.30 <0.30   CBG:  Recent Labs Lab 09/12/12 0046  GLUCAP 127*   Studies: US Abdomen Complete  09/12/2012   *RADIOLOGY REPORT*  Clinical Data:  Abnormal liver function tests.  COMPLETE ABDOMINAL ULTRASOUND  Comparison:  None.  Findings:  Gallbladder:  Cholelithiasis is present.  Largest gallstone measures 19 mm.  No sonographic Murphy's sign or findings of acute cholecystitis.  Gallbladder wall is within normal limits.  Common bile duct:  6 mm, normal.  Liver:  No focal lesion identified.  Within normal limits in parenchymal echogenicity.  IVC:  Appears normal.  Pancreas:  No focal abnormality seen.  Spleen:  8.8 cm.  Normal echotexture.  Right Kidney:  10.9 cm. Normal echotexture.  Normal central sinus echo complex.  No calculi or hydronephrosis.  Left Kidney:  11.2 cm. Normal echotexture.  Normal central sinus echo complex.  No calculi or hydronephrosis.  Abdominal aorta:  Abdominal aortic mural calcification.  No aneurysm.  2.5 cm.  Suboptimal visualization due to overlying bowel gas.  IMPRESSION:  1.  Cholelithiasis without cholecystitis. 2.  Normal appearance of the liver and common bile duct for age.   Original Report Authenticated By: Andreas Newport, M.D.    Scheduled Meds: . aspirin EC  81 mg Oral Daily  . carvedilol  25 mg Oral BID WC  . clopidogrel  75 mg Oral Q breakfast  . furosemide  40 mg Oral Daily  . lisinopril  10 mg Oral Daily  . pantoprazole  40 mg Oral Daily  . sodium chloride  3 mL Intravenous Q12H  . sodium chloride   3 mL Intravenous Q12H   Continuous Infusions: . sodium chloride 75 mL/hr at 09/12/12 4098    Active Problems:   CHF (congestive heart failure)   HTN (hypertension)   Hyperlipidemia   Ischemic cardiomyopathy   CAD (coronary artery disease)   Abnormal LFTs (liver function tests)   Abdominal pain, epigastric  Time spent: 25  Pamella Pert, MD [facility] Pager [phone]. If 7 PM - 7 AM, please contact night-coverage at [email or web address], password [email or web address] 09/13/2012, 10:24 AM  LOS: 2 days

## 2012-09-15 ENCOUNTER — Telehealth (INDEPENDENT_AMBULATORY_CARE_PROVIDER_SITE_OTHER): Payer: Self-pay

## 2012-09-15 NOTE — Telephone Encounter (Signed)
Appointment with DR. [facility] Thursday  09/18/12 @ 3:30 daughter aware

## 2012-09-18 ENCOUNTER — Ambulatory Visit (INDEPENDENT_AMBULATORY_CARE_PROVIDER_SITE_OTHER): Payer: Medicare Other | Admitting: Surgery

## 2012-09-18 ENCOUNTER — Encounter (INDEPENDENT_AMBULATORY_CARE_PROVIDER_SITE_OTHER): Payer: Self-pay | Admitting: Surgery

## 2012-09-18 ENCOUNTER — Encounter (INDEPENDENT_AMBULATORY_CARE_PROVIDER_SITE_OTHER): Payer: Self-pay

## 2012-09-18 VITALS — BP 128/74 | HR 76 | Temp 98.2°F | Resp 16 | Ht 65.0 in | Wt 234.4 lb

## 2012-09-18 DIAGNOSIS — R1013 Epigastric pain: Secondary | ICD-10-CM

## 2012-09-18 NOTE — Progress Notes (Addendum)
Re:   Kaitlyn Simmons DOB:   [birth year] MRN:   [id number]  ASSESSMENT AND PLAN: 1.  Gallstones  I discussed with the patient the indications and risks of gall bladder surgery.  The primary risks of gall bladder surgery include, but are not limited to, bleeding, infection, common bile duct injury, and open surgery.  There is also the risk that the patient may have continued symptoms after surgery.  However, the likelihood of improvement in symptoms and return to the patient's normal status is good. We discussed the typical post-operative recovery course. I tried to answer the patient's questions.  I gave the patient literature about gall bladder surgery.  Plan: 1) Cardiac clearance, 2) Stop Plavix at least 5 days ahead of surgery, 3) Schedule cholecystecotmy  [Cardiac clearance from Dr. Elease Hashimoto.  DN 09/25/2012]  2.  CAD with history of MI  History of coronary stents  Sees Dr. Becky Augusta - will need cardiac clearance and plans for stopping Plavix. 3.  History of heart failure 4.  HTN 5.  Ischemic cardiomyopathy. 6.  GERD 7.  On Plavix 8.  Just hospitalized last week for epigastric pain which proved to be probably related to her gall bladder. 9.  Morbid obesity - BMI - 39  Chief Complaint  Patient presents with  . New Evaluation    eval gallstones/gb   REFERRING PHYSICIAN: Park Pope, MD  HISTORY OF PRESENT ILLNESS: Kaitlyn Simmons is a 67 y.o. (DOB: [birth year])  white  female whose primary care physician is Park Pope, MD and comes to me today for evaluation of gall bladder disease. Kaitlyn Simmons sister, Kaitlyn Simmons, dropped her off in the office.  But it sounds like Steward Drone is staying in the car with her dog.  Ms. Sliwa is not a great historian.  Ms. Runk was recently hospitalized at [facility] (09/11/2012 - 09/13/2012)  for epigastric pain which was initially evaluated for cardiac disease.  She actually first presented to [facility] and was transferred to [facility].  At first she told me, "I  was in the hospital, but I was not sure why I was there."   She was noted to have elevated LFT's.  Alk Phos - 222 and T. Bili - 1.1 on 09/12/2012. Her hepatitis panel was negative.   An [place] on 09/12/2012 showed Cholelithiasis, largest stone 1.8 cm.  She was discharged home with follow up with general surgery.  Her sister had gall bladder disease. She is unsure if she has had a colonoscopy.  She has had no prior abdominal surgery.   Past Medical History  Diagnosis Date  . CAD (coronary artery disease)     a. s/p IL STEMI 7/11 => tx with BMS to RCA (tx at [facility]);  b.  AL STEMI 7/12 => LHC: mRCA 100% ==> PCI with BMS to RCA; LM 10%, pCFX 50%, OM1 10%, pLAD 50%, mLAD 99%, 70%  ==>  c. staged PCI: BMS to  LAD (tx at [facility])  . Chronic systolic heart failure   . HTN (hypertension)   . Hyperlipidemia   . Ischemic cardiomyopathy     a. EF 30-35% at time of AL STEMI 7/12;   b. IMPROVED by f/u Echo 9/13:  mild LVH, EF 45-50%, mid to dist Inf, AS, Apical HK, Gr 1 diast dysfn, MAC, mod LAE  . Cataracts, bilateral   . CHF (congestive heart failure)   . GERD (gastroesophageal reflux disease)   . STEMI (ST elevation myocardial infarction) 2011; 2012  [place]; [place]      Past Surgical History  Procedure Laterality Date  . Tonsillectomy and adenoidectomy  1980's  . Coronary angioplasty with stent placement  2011; 2012    RCA; RCA & LAD  . Dilation and curettage of uterus  1970's  . Cataract extraction w/ intraocular lens implant Left ~ 2008      Current Outpatient Prescriptions  Medication Sig Dispense Refill  . aspirin 81 MG tablet Take 81 mg by mouth daily.        Marland Kitchen atorvastatin (LIPITOR) 80 MG tablet Take 80 mg by mouth daily.        . carvedilol (COREG) 25 MG tablet Take 1 tablet (25 mg total) by mouth 2 (two) times daily with a meal.  60 tablet  5  . clopidogrel (PLAVIX) 75 MG tablet Take 1 tablet (75 mg total) by mouth daily.  90 tablet  3  . FLUoxetine (PROZAC) 10 MG capsule Take 10 mg by mouth daily.       . furosemide (LASIX) 40 MG tablet Take 1 tablet (40 mg total) by mouth daily.  90 tablet  2  . lisinopril (PRINIVIL,ZESTRIL) 10 MG tablet Take 1 tablet (10 mg total) by mouth daily.  90 tablet  3  . nitroGLYCERIN (NITROSTAT) 0.4 MG SL tablet Place 0.4 mg under the tongue every 5 (five) minutes as needed for chest pain.      . pantoprazole (PROTONIX) 40 MG tablet Take 40 mg by mouth daily.         No current facility-administered medications for this visit.     No Known Allergies  REVIEW OF SYSTEMS: Skin:  No history of rash.  No history of abnormal moles. Infection:  No history of hepatitis or HIV.  No history of MRSA. Neurologic:  No history of stroke.  No history of seizure.  No history of headaches. Cardiac:  History of CAD.  History of cardiac stent.  Sees Dr. Melburn Popper.  Hypertension.  Ischemic cardiomyopathy.  Echo - 11/23/2011 - 45% EF. Pulmonary:  Quit smoking 2008.  Endocrine:  No diabetes. No thyroid disease. Gastrointestinal:  See HPI.  No history of stomach disease.  No history of liver disease.  No history of pancreas disease.  No history of colon disease.  Not sure if she has ever had a colonoscopy. Urologic:  No history of kidney stones.  No history of bladder infections. Musculoskeletal:  No history of joint or back disease. Hematologic:  On Plavix. Psycho-social:  The patient is oriented.   The patient has no obvious psychologic or social impairment to understanding our conversation and plan.  SOCIAL and FAMILY HISTORY: Divorced. Lives with sister, Kaitlyn Simmons, and [address] She is from [place], [place]. Has 2 children, daughter 60 who lives in [place], and son 27 who lives in [place]. Retired in 2008.  She worked as a Production designer, theatre/television/film of a radio station.  PHYSICAL EXAM: BP 128/74  Pulse 76  Temp(Src) 98.2 F (36.8 C) (Temporal)  Resp 16  Ht 5\' 5"  (1.651 m)  Wt 234 lb 6.4 oz (106.323 kg)  BMI 39.01 kg/m2  General: Obese WF who is alert and generally healthy  appearing.  She is a little disconnected on her history. HEENT: Normal. Pupils equal. Neck: Supple. No mass.  No thyroid mass. Lymph Nodes:  No supraclavicular or cervical nodes. Lungs: Clear to auscultation and symmetric breath sounds. Heart:  RRR. No murmur or rub. Abdomen: Soft. No mass. No tenderness. No hernia. Normal bowel sounds.  No abdominal scars. Rectal: Not done. Extremities:  Good strength and ROM  in upper and lower extremities. Neurologic:  Grossly intact to motor and sensory function. Psychiatric: Has normal mood and affect. Behavior is normal.   DATA REVIEWED: Epic notes.  Ovidio Kin, MD,  [facility] [address] [place]  [address]   [place], [place]    [address] Phone:  [phone] FAX:  [phone]

## 2012-09-18 NOTE — Patient Instructions (Signed)
Will call to schedule surgery after cardiac clearance Will need to stop Plavix before surgery, but will give instructions after cardiac clearance.

## 2012-09-29 ENCOUNTER — Telehealth (INDEPENDENT_AMBULATORY_CARE_PROVIDER_SITE_OTHER): Payer: Self-pay

## 2012-09-29 NOTE — Telephone Encounter (Signed)
Post op appt 10/29/12@10  am advised to stop plavix  5 days prior to surgery

## 2012-10-01 ENCOUNTER — Other Ambulatory Visit: Payer: Self-pay

## 2012-10-01 NOTE — Progress Notes (Signed)
Dr. Ezzard Standing - Please enter preop orders in Epic for [facility] - she is coming to [facility] Friday 8/8 at 10 am for her preop appointment / labs.     Thanks.

## 2012-10-03 ENCOUNTER — Encounter (HOSPITAL_COMMUNITY): Payer: Self-pay | Admitting: Pharmacy Technician

## 2012-10-03 ENCOUNTER — Other Ambulatory Visit (INDEPENDENT_AMBULATORY_CARE_PROVIDER_SITE_OTHER): Payer: Self-pay | Admitting: Surgery

## 2012-10-03 ENCOUNTER — Ambulatory Visit (HOSPITAL_COMMUNITY)
Admission: RE | Admit: 2012-10-03 | Discharge: 2012-10-03 | Disposition: A | Discharge status: Home / Self Care | Payer: Medicare Other | Source: Ambulatory Visit | Attending: Surgery | Admitting: Surgery

## 2012-10-03 ENCOUNTER — Encounter (HOSPITAL_COMMUNITY)
Admission: RE | Admit: 2012-10-03 | Discharge: 2012-10-03 | Disposition: A | Discharge status: Home / Self Care | Payer: Medicare Other | Source: Ambulatory Visit | Attending: Surgery | Admitting: Surgery

## 2012-10-03 ENCOUNTER — Encounter (HOSPITAL_COMMUNITY): Payer: Self-pay

## 2012-10-03 DIAGNOSIS — Z01818 Encounter for other preprocedural examination: Secondary | ICD-10-CM | POA: Insufficient documentation

## 2012-10-03 DIAGNOSIS — Z01812 Encounter for preprocedural laboratory examination: Secondary | ICD-10-CM | POA: Insufficient documentation

## 2012-10-03 HISTORY — DX: Unspecified dementia, unspecified severity, without behavioral disturbance, psychotic disturbance, mood disturbance, and anxiety: F03.90

## 2012-10-03 LAB — COMPREHENSIVE METABOLIC PANEL
Albumin: 4 g/dL (ref 3.5–5.2)
Alkaline Phosphatase: 168 U/L — ABNORMAL HIGH (ref 39–117)
CO2: 27 mEq/L (ref 19–32)
Calcium: 10 mg/dL (ref 8.4–10.5)
Chloride: 100 mEq/L (ref 96–112)
GFR calc Af Amer: 45 mL/min — ABNORMAL LOW (ref >90)
Potassium: 4.1 mEq/L (ref 3.5–5.1)
Sodium: 138 mEq/L (ref 135–145)
Total Protein: 7.7 g/dL (ref 6.0–8.3)

## 2012-10-03 LAB — CBC
Platelets: 244 10*3/uL (ref 150–400)
RBC: 4.41 MIL/uL (ref 3.87–5.11)
WBC: 9.5 10*3/uL (ref 4.0–10.5)

## 2012-10-03 NOTE — Progress Notes (Signed)
Need orders in [facility] for surgery on 10/07/12.  Patient was seen on preop appointment on 10/03/12.  Thank You.

## 2012-10-03 NOTE — Progress Notes (Signed)
CMP results routed to Dr Raelyn Mora.

## 2012-10-03 NOTE — Progress Notes (Signed)
Last ECHO 10/2011 in EPIC  LOV with Dr Elease Hashimoto 12/2011 in EPIC  d

## 2012-10-03 NOTE — Patient Instructions (Signed)
Kaitlyn Simmons  10/03/2012   Your procedure is scheduled on:  10/07/12               Surgery 1200noon-145pm  Report to [facility] at    0930  AM.  Call this number if you have problems the morning of surgery: [phone]   Remember:   Do not eat food or drink liquids after midnight.   Take these medicines the morning of surgery with A SIP OF WATER:    Do not wear jewelry, make-up or nail polish.  Do not wear lotions, powders, or perfumes.   Do not shave 48 hours prior to surgery.   Do not bring valuables to the hospital.  Contacts, dentures or bridgework may not be worn into surgery.  Leave suitcase in the car. After surgery it may be brought to your room.  For patients admitted to the hospital, checkout time is 11:00 AM the day of  discharge.       SEE CHG INSTRUCTION SHEET    Please read over the following fact sheets that you were given:  coughing and deep breathing exercises, leg exercises               Failure to comply with these instructions may result in cancellation of your surgery.                Patient Signature ____________________________              Nurse Signature _____________________________

## 2012-10-03 NOTE — Progress Notes (Signed)
Patient's medications are prepackaged in a dispenser with foil by her pharmacy  Due to short term memory issues. Patient lives with sister and uncle.  The pm pills are in one container in a monthly dispenser and the am pills in another.   Daughter and sister are aware when patient is to stop Plavix.  They will check with pharmacy as to which medication in pill dispenser is Plavix.  I instructed them no pills am of surgery and written on instructions and reviewed with daughter and sister.  I told them nurse would give patients what medications she needed on arrival to Short Stay.  They voiced understanding since all pills are packaged in one dispenser.

## 2012-10-03 NOTE — Progress Notes (Signed)
Patient recently in hospital at [facility] for chest pain ( epigastric pain ) See Discharge Summary per Dr Algis Greenhouse located under notes dated 09/13/12.

## 2012-10-07 ENCOUNTER — Ambulatory Visit (HOSPITAL_COMMUNITY): Payer: Medicare Other

## 2012-10-07 ENCOUNTER — Observation Stay (HOSPITAL_COMMUNITY)
Admission: RE | Admit: 2012-10-07 | Discharge: 2012-10-09 | Disposition: A | Discharge status: Home / Self Care | Payer: Medicare Other | Source: Ambulatory Visit | Attending: Surgery | Admitting: Surgery

## 2012-10-07 ENCOUNTER — Encounter (HOSPITAL_COMMUNITY)
Admission: RE | Disposition: A | Discharge status: Home / Self Care | Payer: Self-pay | Source: Ambulatory Visit | Attending: Surgery

## 2012-10-07 ENCOUNTER — Ambulatory Visit (HOSPITAL_COMMUNITY): Payer: Medicare Other | Admitting: Anesthesiology

## 2012-10-07 ENCOUNTER — Encounter (HOSPITAL_COMMUNITY): Payer: Self-pay | Admitting: *Deleted

## 2012-10-07 ENCOUNTER — Encounter (HOSPITAL_COMMUNITY): Payer: Self-pay | Admitting: Anesthesiology

## 2012-10-07 DIAGNOSIS — K8010 Calculus of gallbladder with chronic cholecystitis without obstruction: Secondary | ICD-10-CM

## 2012-10-07 DIAGNOSIS — K8050 Calculus of bile duct without cholangitis or cholecystitis without obstruction: Secondary | ICD-10-CM

## 2012-10-07 DIAGNOSIS — Z7982 Long term (current) use of aspirin: Secondary | ICD-10-CM | POA: Insufficient documentation

## 2012-10-07 DIAGNOSIS — D134 Benign neoplasm of liver: Secondary | ICD-10-CM | POA: Insufficient documentation

## 2012-10-07 DIAGNOSIS — R945 Abnormal results of liver function studies: Secondary | ICD-10-CM

## 2012-10-07 DIAGNOSIS — I509 Heart failure, unspecified: Secondary | ICD-10-CM | POA: Insufficient documentation

## 2012-10-07 DIAGNOSIS — K219 Gastro-esophageal reflux disease without esophagitis: Secondary | ICD-10-CM | POA: Insufficient documentation

## 2012-10-07 DIAGNOSIS — K319 Disease of stomach and duodenum, unspecified: Secondary | ICD-10-CM | POA: Diagnosis present

## 2012-10-07 DIAGNOSIS — I2589 Other forms of chronic ischemic heart disease: Secondary | ICD-10-CM | POA: Insufficient documentation

## 2012-10-07 DIAGNOSIS — K8060 Calculus of gallbladder and bile duct with cholecystitis, unspecified, without obstruction: Principal | ICD-10-CM | POA: Insufficient documentation

## 2012-10-07 DIAGNOSIS — K8064 Calculus of gallbladder and bile duct with chronic cholecystitis without obstruction: Secondary | ICD-10-CM | POA: Insufficient documentation

## 2012-10-07 DIAGNOSIS — R932 Abnormal findings on diagnostic imaging of liver and biliary tract: Secondary | ICD-10-CM | POA: Diagnosis present

## 2012-10-07 DIAGNOSIS — I252 Old myocardial infarction: Secondary | ICD-10-CM | POA: Insufficient documentation

## 2012-10-07 DIAGNOSIS — I2510 Atherosclerotic heart disease of native coronary artery without angina pectoris: Secondary | ICD-10-CM | POA: Insufficient documentation

## 2012-10-07 DIAGNOSIS — I10 Essential (primary) hypertension: Secondary | ICD-10-CM | POA: Insufficient documentation

## 2012-10-07 DIAGNOSIS — R1013 Epigastric pain: Secondary | ICD-10-CM

## 2012-10-07 DIAGNOSIS — Z79899 Other long term (current) drug therapy: Secondary | ICD-10-CM | POA: Insufficient documentation

## 2012-10-07 DIAGNOSIS — Z7902 Long term (current) use of antithrombotics/antiplatelets: Secondary | ICD-10-CM | POA: Insufficient documentation

## 2012-10-07 DIAGNOSIS — Z6839 Body mass index (BMI) 39.0-39.9, adult: Secondary | ICD-10-CM | POA: Insufficient documentation

## 2012-10-07 DIAGNOSIS — E785 Hyperlipidemia, unspecified: Secondary | ICD-10-CM | POA: Insufficient documentation

## 2012-10-07 DIAGNOSIS — I5022 Chronic systolic (congestive) heart failure: Secondary | ICD-10-CM | POA: Insufficient documentation

## 2012-10-07 HISTORY — PX: CHOLECYSTECTOMY: SHX55

## 2012-10-07 SURGERY — LAPAROSCOPIC CHOLECYSTECTOMY WITH INTRAOPERATIVE CHOLANGIOGRAM
Anesthesia: General | Site: Abdomen | Wound class: Clean Contaminated

## 2012-10-07 MED ORDER — MORPHINE SULFATE 2 MG/ML IJ SOLN
INTRAMUSCULAR | Status: AC
  Filled 2012-10-07: qty 1

## 2012-10-07 MED ORDER — BUPIVACAINE HCL (PF) 0.25 % IJ SOLN
INTRAMUSCULAR | Status: AC
  Filled 2012-10-07: qty 30

## 2012-10-07 MED ORDER — HEPARIN SODIUM (PORCINE) 5000 UNIT/ML IJ SOLN
5000.0000 [IU] | Freq: Three times a day (TID) | INTRAMUSCULAR | Status: DC
  Administered 2012-10-07 – 2012-10-09 (×3): 5000 [IU] via SUBCUTANEOUS
  Filled 2012-10-07 (×8): qty 1

## 2012-10-07 MED ORDER — HYDROMORPHONE HCL PF 1 MG/ML IJ SOLN
INTRAMUSCULAR | Status: AC
  Filled 2012-10-07: qty 1

## 2012-10-07 MED ORDER — LACTATED RINGERS IV SOLN
INTRAVENOUS | Status: DC | PRN
  Administered 2012-10-07 (×2): via INTRAVENOUS

## 2012-10-07 MED ORDER — SUCCINYLCHOLINE CHLORIDE 20 MG/ML IJ SOLN
INTRAMUSCULAR | Status: DC | PRN
  Administered 2012-10-07: 100 mg via INTRAVENOUS

## 2012-10-07 MED ORDER — CHLORHEXIDINE GLUCONATE 0.12 % MT SOLN
15.0000 mL | Freq: Two times a day (BID) | OROMUCOSAL | Status: DC
  Administered 2012-10-07 – 2012-10-09 (×4): 15 mL via OROMUCOSAL
  Filled 2012-10-07 (×6): qty 15

## 2012-10-07 MED ORDER — BIOTENE DRY MOUTH MT LIQD
15.0000 mL | Freq: Two times a day (BID) | OROMUCOSAL | Status: DC
  Administered 2012-10-08 – 2012-10-09 (×2): 15 mL via OROMUCOSAL

## 2012-10-07 MED ORDER — ROCURONIUM BROMIDE 100 MG/10ML IV SOLN
INTRAVENOUS | Status: DC | PRN
  Administered 2012-10-07: 30 mg via INTRAVENOUS

## 2012-10-07 MED ORDER — FENTANYL CITRATE 0.05 MG/ML IJ SOLN
INTRAMUSCULAR | Status: DC | PRN
  Administered 2012-10-07 (×5): 50 ug via INTRAVENOUS

## 2012-10-07 MED ORDER — MEPERIDINE HCL 50 MG/ML IJ SOLN: 6.2500 mg | INTRAMUSCULAR | Status: DC | PRN

## 2012-10-07 MED ORDER — CEFAZOLIN SODIUM-DEXTROSE 2-3 GM-% IV SOLR
2.0000 g | INTRAVENOUS | Status: AC
  Administered 2012-10-07: 2 g via INTRAVENOUS

## 2012-10-07 MED ORDER — OXYCODONE HCL 5 MG/5ML PO SOLN
5.0000 mg | Freq: Once | ORAL | Status: DC | PRN
Start: 2012-10-07 — End: 2012-10-07
  Filled 2012-10-07: qty 5

## 2012-10-07 MED ORDER — KCL IN DEXTROSE-NACL 20-5-0.45 MEQ/L-%-% IV SOLN
INTRAVENOUS | Status: DC
  Administered 2012-10-07 – 2012-10-08 (×2): via INTRAVENOUS
  Administered 2012-10-08: 100 mL via INTRAVENOUS
  Administered 2012-10-09: 100 mL/h via INTRAVENOUS
  Filled 2012-10-07 (×6): qty 1000

## 2012-10-07 MED ORDER — ATROPINE SULFATE 0.4 MG/ML IJ SOLN
INTRAMUSCULAR | Status: DC | PRN
  Administered 2012-10-07: 0.4 mg via INTRAVENOUS

## 2012-10-07 MED ORDER — ONDANSETRON HCL 4 MG PO TABS: 4.0000 mg | ORAL_TABLET | Freq: Four times a day (QID) | ORAL | Status: DC | PRN

## 2012-10-07 MED ORDER — HYDROCODONE-ACETAMINOPHEN 5-325 MG PO TABS: 1.0000 | ORAL_TABLET | ORAL | Status: DC | PRN

## 2012-10-07 MED ORDER — NITROGLYCERIN 0.4 MG SL SUBL: 0.4000 mg | SUBLINGUAL_TABLET | SUBLINGUAL | Status: DC | PRN

## 2012-10-07 MED ORDER — OXYCODONE HCL 5 MG PO TABS: 5.0000 mg | ORAL_TABLET | Freq: Once | ORAL | Status: DC | PRN

## 2012-10-07 MED ORDER — BUPIVACAINE HCL (PF) 0.25 % IJ SOLN
INTRAMUSCULAR | Status: DC | PRN
  Administered 2012-10-07: 30 mL

## 2012-10-07 MED ORDER — LISINOPRIL 10 MG PO TABS
10.0000 mg | ORAL_TABLET | Freq: Every morning | ORAL | Status: DC
  Administered 2012-10-09: 10 mg via ORAL
  Filled 2012-10-07 (×2): qty 1

## 2012-10-07 MED ORDER — NEOSTIGMINE METHYLSULFATE 1 MG/ML IJ SOLN
INTRAMUSCULAR | Status: DC | PRN
  Administered 2012-10-07: 4 mg via INTRAVENOUS

## 2012-10-07 MED ORDER — ONDANSETRON HCL 4 MG/2ML IJ SOLN: 4.0000 mg | Freq: Four times a day (QID) | INTRAMUSCULAR | Status: DC | PRN

## 2012-10-07 MED ORDER — CHLORHEXIDINE GLUCONATE 4 % EX LIQD
1.0000 "application " | Freq: Once | CUTANEOUS | Status: DC
  Filled 2012-10-07: qty 15

## 2012-10-07 MED ORDER — 0.9 % SODIUM CHLORIDE (POUR BTL) OPTIME
TOPICAL | Status: DC | PRN
  Administered 2012-10-07: 8 mL

## 2012-10-07 MED ORDER — LACTATED RINGERS IV SOLN
INTRAVENOUS | Status: DC | PRN
  Administered 2012-10-07: 1000 mL

## 2012-10-07 MED ORDER — PROPOFOL 10 MG/ML IV BOLUS
INTRAVENOUS | Status: DC | PRN
  Administered 2012-10-07: 200 mg via INTRAVENOUS

## 2012-10-07 MED ORDER — CEFAZOLIN SODIUM-DEXTROSE 2-3 GM-% IV SOLR
INTRAVENOUS | Status: AC
  Filled 2012-10-07: qty 50

## 2012-10-07 MED ORDER — HYDROMORPHONE HCL PF 1 MG/ML IJ SOLN
0.2500 mg | INTRAMUSCULAR | Status: DC | PRN
  Administered 2012-10-07 (×2): 0.25 mg via INTRAVENOUS
  Administered 2012-10-07 (×2): 0.5 mg via INTRAVENOUS

## 2012-10-07 MED ORDER — FLUOXETINE HCL 10 MG PO CAPS
10.0000 mg | ORAL_CAPSULE | Freq: Every morning | ORAL | Status: DC
  Administered 2012-10-08 – 2012-10-09 (×2): 10 mg via ORAL
  Filled 2012-10-07 (×2): qty 1

## 2012-10-07 MED ORDER — LACTATED RINGERS IV SOLN: INTRAVENOUS | Status: DC

## 2012-10-07 MED ORDER — IOHEXOL 300 MG/ML  SOLN
INTRAMUSCULAR | Status: AC
  Filled 2012-10-07: qty 1

## 2012-10-07 MED ORDER — LIDOCAINE HCL (CARDIAC) 20 MG/ML IV SOLN
INTRAVENOUS | Status: DC | PRN
  Administered 2012-10-07: 100 mg via INTRAVENOUS

## 2012-10-07 MED ORDER — GLYCOPYRROLATE 0.2 MG/ML IJ SOLN
INTRAMUSCULAR | Status: DC | PRN
  Administered 2012-10-07: .6 mg via INTRAVENOUS

## 2012-10-07 MED ORDER — MORPHINE SULFATE 2 MG/ML IJ SOLN
1.0000 mg | INTRAMUSCULAR | Status: DC | PRN
  Administered 2012-10-07 – 2012-10-08 (×5): 2 mg via INTRAVENOUS
  Filled 2012-10-07 (×4): qty 1

## 2012-10-07 MED ORDER — EPHEDRINE SULFATE 50 MG/ML IJ SOLN
INTRAMUSCULAR | Status: DC | PRN
  Administered 2012-10-07: 5 mg via INTRAVENOUS

## 2012-10-07 MED ORDER — PANTOPRAZOLE SODIUM 40 MG PO TBEC
40.0000 mg | DELAYED_RELEASE_TABLET | Freq: Every day | ORAL | Status: DC
  Administered 2012-10-07 – 2012-10-09 (×3): 40 mg via ORAL
  Filled 2012-10-07 (×3): qty 1

## 2012-10-07 MED ORDER — ONDANSETRON HCL 4 MG/2ML IJ SOLN
INTRAMUSCULAR | Status: DC | PRN
  Administered 2012-10-07: 4 mg via INTRAVENOUS

## 2012-10-07 MED ORDER — PROMETHAZINE HCL 25 MG/ML IJ SOLN: 6.2500 mg | INTRAMUSCULAR | Status: DC | PRN

## 2012-10-07 MED ORDER — DIATRIZOATE MEGLUMINE 30 % UR SOLN
URETHRAL | Status: DC | PRN
  Administered 2012-10-07: 8 mL

## 2012-10-07 MED ORDER — FUROSEMIDE 40 MG PO TABS
40.0000 mg | ORAL_TABLET | Freq: Every morning | ORAL | Status: DC
  Administered 2012-10-08 – 2012-10-09 (×2): 40 mg via ORAL
  Filled 2012-10-07 (×2): qty 1

## 2012-10-07 MED ORDER — ATORVASTATIN CALCIUM 80 MG PO TABS
80.0000 mg | ORAL_TABLET | Freq: Every day | ORAL | Status: DC
  Administered 2012-10-07 – 2012-10-08 (×2): 80 mg via ORAL
  Filled 2012-10-07 (×3): qty 1

## 2012-10-07 MED ORDER — CARVEDILOL 25 MG PO TABS
25.0000 mg | ORAL_TABLET | Freq: Two times a day (BID) | ORAL | Status: DC
  Administered 2012-10-07 – 2012-10-09 (×2): 25 mg via ORAL
  Filled 2012-10-07 (×8): qty 1

## 2012-10-07 SURGICAL SUPPLY — 45 items
ADH SKN CLS APL DERMABOND .7 (GAUZE/BANDAGES/DRESSINGS)
APL SKNCLS STERI-STRIP NONHPOA (GAUZE/BANDAGES/DRESSINGS)
APPLIER CLIP ROT 10 11.4 M/L (STAPLE) ×2
APR CLP MED LRG 11.4X10 (STAPLE) ×1
BAG SPEC RTRVL LRG 6X4 10 (ENDOMECHANICALS)
BENZOIN TINCTURE PRP APPL 2/3 (GAUZE/BANDAGES/DRESSINGS) ×1 IMPLANT
CANISTER SUCTION 2500CC (MISCELLANEOUS) ×2 IMPLANT
CHLORAPREP W/TINT 26ML (MISCELLANEOUS) ×2 IMPLANT
CHOLANGIOGRAM CATH TAUT (CATHETERS) ×2 IMPLANT
CLIP APPLIE ROT 10 11.4 M/L (STAPLE) ×1 IMPLANT
CLOTH BEACON ORANGE TIMEOUT ST (SAFETY) ×2 IMPLANT
COVER MAYO STAND STRL (DRAPES) ×1 IMPLANT
DECANTER SPIKE VIAL GLASS SM (MISCELLANEOUS) ×2 IMPLANT
DERMABOND ADVANCED (GAUZE/BANDAGES/DRESSINGS)
DERMABOND ADVANCED .7 DNX12 (GAUZE/BANDAGES/DRESSINGS) IMPLANT
DRAPE C-ARM 42X120 X-RAY (DRAPES) ×1 IMPLANT
DRAPE LAPAROSCOPIC ABDOMINAL (DRAPES) ×2 IMPLANT
ELECT REM PT RETURN 9FT ADLT (ELECTROSURGICAL) ×2
ELECTRODE REM PT RTRN 9FT ADLT (ELECTROSURGICAL) ×1 IMPLANT
ENDOLOOP SUT PDS II  0 18 (SUTURE) ×4
ENDOLOOP SUT PDS II 0 18 (SUTURE) IMPLANT
GLOVE BIOGEL PI IND STRL 7.0 (GLOVE) ×1 IMPLANT
GLOVE BIOGEL PI INDICATOR 7.0 (GLOVE) ×1
GLOVE SURG SIGNA 7.5 PF LTX (GLOVE) ×2 IMPLANT
GOWN STRL NON-REIN LRG LVL3 (GOWN DISPOSABLE) ×2 IMPLANT
GOWN STRL REIN XL XLG (GOWN DISPOSABLE) ×4 IMPLANT
HEMOSTAT SURGICEL 4X8 (HEMOSTASIS) IMPLANT
IV CATH 14GX2 1/4 (CATHETERS) ×2 IMPLANT
IV SET EXT 30 76VOL 4 MALE LL (IV SETS) ×2 IMPLANT
KIT BASIN OR (CUSTOM PROCEDURE TRAY) ×2 IMPLANT
NS IRRIG 1000ML POUR BTL (IV SOLUTION) IMPLANT
POUCH SPECIMEN RETRIEVAL 10MM (ENDOMECHANICALS) IMPLANT
SET IRRIG TUBING LAPAROSCOPIC (IRRIGATION / IRRIGATOR) ×2 IMPLANT
SOLUTION ANTI FOG 6CC (MISCELLANEOUS) ×2 IMPLANT
STOPCOCK K 69 2C6206 (IV SETS) ×2 IMPLANT
STRIP CLOSURE SKIN 1/4X4 (GAUZE/BANDAGES/DRESSINGS) ×2 IMPLANT
SUT VIC AB 5-0 PS2 18 (SUTURE) ×2 IMPLANT
TOWEL OR 17X26 10 PK STRL BLUE (TOWEL DISPOSABLE) ×6 IMPLANT
TRAY LAP CHOLE (CUSTOM PROCEDURE TRAY) ×2 IMPLANT
TROCAR BLADELESS OPT 5 100 (ENDOMECHANICALS) ×6 IMPLANT
TROCAR XCEL BLUNT TIP 100MML (ENDOMECHANICALS) ×2 IMPLANT
TROCAR XCEL NON-BLD 11X100MML (ENDOMECHANICALS) ×2 IMPLANT
TROCAR XCEL UNIV SLVE 11M 100M (ENDOMECHANICALS) IMPLANT
TUBING INSUFFLATION 10FT LAP (TUBING) ×2 IMPLANT
WATER STERILE IRR 1500ML POUR (IV SOLUTION) ×2 IMPLANT

## 2012-10-07 NOTE — Progress Notes (Signed)
Pulse 51. Coreg omitted today prior to surgery per Dr. Renold Don

## 2012-10-07 NOTE — H&P (View-Only) (Signed)
 Re:   Kaitlyn Simmons DOB:   04/02/1945 MRN:   [id number]  ASSESSMENT AND PLAN: 1.  Gallstones  I discussed with the patient the indications and risks of gall bladder surgery.  The primary risks of gall bladder surgery include, but are not limited to, bleeding, infection, common bile duct injury, and open surgery.  There is also the risk that the patient may have continued symptoms after surgery.  However, the likelihood of improvement in symptoms and return to the patient's normal status is good. We discussed the typical post-operative recovery course. I tried to answer the patient's questions.  I gave the patient literature about gall bladder surgery.  Plan: 1) Cardiac clearance, 2) Stop Plavix at least 5 days ahead of surgery, 3) Schedule cholecystecotmy  [Cardiac clearance from Dr. Nahser.  DN 09/25/2012]  2.  CAD with history of MI  History of coronary stents  Sees Dr. P. Nasher - will need cardiac clearance and plans for stopping Plavix. 3.  History of heart failure 4.  HTN 5.  Ischemic cardiomyopathy. 6.  GERD 7.  On Plavix 8.  Just hospitalized last week for epigastric pain which proved to be probably related to her gall bladder. 9.  Morbid obesity - BMI - 39  Chief Complaint  Patient presents with  . New Evaluation    eval gallstones/gb   REFERRING PHYSICIAN: HAWKINS,JAMES H, MD  HISTORY OF PRESENT ILLNESS: Kaitlyn Simmons is a 67 y.o. (DOB: 04/27/1945)  white  female whose primary care physician is HAWKINS,JAMES H, MD and comes to me today for evaluation of gall bladder disease. Ms. Noffke's sister, Brenda Davis, dropped her off in the office.  But it sounds like Brenda is staying in the car with her dog.  Ms. Elgersma is not a great historian.  Ms. Kates was recently hospitalized at [facility] (09/11/2012 - 09/13/2012)  for epigastric pain which was initially evaluated for cardiac disease.  She actually first presented to [facility] and was transferred to [facility].  At first she told me, "I  was in the hospital, but I was not sure why I was there."   She was noted to have elevated LFT's.  Alk Phos - 222 and T. Bili - 1.1 on 09/12/2012. Her hepatitis panel was negative.   An US on 09/12/2012 showed Cholelithiasis, largest stone 1.8 cm.  She was discharged home with follow up with general surgery.  Her sister had gall bladder disease. She is unsure if she has had a colonoscopy.  She has had no prior abdominal surgery.   Past Medical History  Diagnosis Date  . CAD (coronary artery disease)     a. s/p IL STEMI 7/11 => tx with BMS to RCA (tx at [facility]);  b.  AL STEMI 7/12 => LHC: mRCA 100% ==> PCI with BMS to RCA; LM 10%, pCFX 50%, OM1 10%, pLAD 50%, mLAD 99%, 70%  ==>  c. staged PCI: BMS to  LAD (tx at [facility])  . Chronic systolic heart failure   . HTN (hypertension)   . Hyperlipidemia   . Ischemic cardiomyopathy     a. EF 30-35% at time of AL STEMI 7/12;   b. IMPROVED by f/u Echo 9/13:  mild LVH, EF 45-50%, mid to dist Inf, AS, Apical HK, Gr 1 diast dysfn, MAC, mod LAE  . Cataracts, bilateral   . CHF (congestive heart failure)   . GERD (gastroesophageal reflux disease)   . STEMI (ST elevation myocardial infarction) 2011; 2012      [place]; [place]      Past Surgical History  Procedure Laterality Date  . Tonsillectomy and adenoidectomy  1980's  . Coronary angioplasty with stent placement  2011; 2012    RCA; RCA & LAD  . Dilation and curettage of uterus  1970's  . Cataract extraction w/ intraocular lens implant Left ~ 2008      Current Outpatient Prescriptions  Medication Sig Dispense Refill  . aspirin 81 MG tablet Take 81 mg by mouth daily.        . atorvastatin (LIPITOR) 80 MG tablet Take 80 mg by mouth daily.        . carvedilol (COREG) 25 MG tablet Take 1 tablet (25 mg total) by mouth 2 (two) times daily with a meal.  60 tablet  5  . clopidogrel (PLAVIX) 75 MG tablet Take 1 tablet (75 mg total) by mouth daily.  90 tablet  3  . FLUoxetine (PROZAC) 10 MG capsule Take 10 mg by mouth daily.       . furosemide (LASIX) 40 MG tablet Take 1 tablet (40 mg total) by mouth daily.  90 tablet  2  . lisinopril (PRINIVIL,ZESTRIL) 10 MG tablet Take 1 tablet (10 mg total) by mouth daily.  90 tablet  3  . nitroGLYCERIN (NITROSTAT) 0.4 MG SL tablet Place 0.4 mg under the tongue every 5 (five) minutes as needed for chest pain.      . pantoprazole (PROTONIX) 40 MG tablet Take 40 mg by mouth daily.         No current facility-administered medications for this visit.     No Known Allergies  REVIEW OF SYSTEMS: Skin:  No history of rash.  No history of abnormal moles. Infection:  No history of hepatitis or HIV.  No history of MRSA. Neurologic:  No history of stroke.  No history of seizure.  No history of headaches. Cardiac:  History of CAD.  History of cardiac stent.  Sees Dr. Nasher.  Hypertension.  Ischemic cardiomyopathy.  Echo - 11/23/2011 - 45% EF. Pulmonary:  Quit smoking 2008.  Endocrine:  No diabetes. No thyroid disease. Gastrointestinal:  See HPI.  No history of stomach disease.  No history of liver disease.  No history of pancreas disease.  No history of colon disease.  Not sure if she has ever had a colonoscopy. Urologic:  No history of kidney stones.  No history of bladder infections. Musculoskeletal:  No history of joint or back disease. Hematologic:  On Plavix. Psycho-social:  The patient is oriented.   The patient has no obvious psychologic or social impairment to understanding our conversation and plan.  SOCIAL and FAMILY HISTORY: Divorced. Lives with sister, Brenda Davis, and Uncle. She is from [place], [place]. Has 2 children, daughter 44 who lives in [place], and son 48 who lives in [place]. Retired in 2008.  She worked as a manager of a radio station.  PHYSICAL EXAM: BP 128/74  Pulse 76  Temp(Src) 98.2 F (36.8 C) (Temporal)  Resp 16  Ht 5' 5" (1.651 m)  Wt 234 lb 6.4 oz (106.323 kg)  BMI 39.01 kg/m2  General: Obese WF who is alert and generally healthy  appearing.  She is a little disconnected on her history. HEENT: Normal. Pupils equal. Neck: Supple. No mass.  No thyroid mass. Lymph Nodes:  No supraclavicular or cervical nodes. Lungs: Clear to auscultation and symmetric breath sounds. Heart:  RRR. No murmur or rub. Abdomen: Soft. No mass. No tenderness. No hernia. Normal bowel sounds.    No abdominal scars. Rectal: Not done. Extremities:  Good strength and ROM  in upper and lower extremities. Neurologic:  Grossly intact to motor and sensory function. Psychiatric: Has normal mood and affect. Behavior is normal.   DATA REVIEWED: Epic notes.  Kaiven Vester, MD,  [facility] [address]  [address]   [place], [place]    [address] Phone:  [phone] FAX:  [phone]  

## 2012-10-07 NOTE — Interval H&P Note (Signed)
History and Physical Interval Note:  10/07/2012 12:23 PM  Kaitlyn Simmons  has presented today for surgery, with the diagnosis of gallbladder disease  The various methods of treatment have been discussed with the patient and family.  Daughter at the bedside.  After consideration of risks, benefits and other options for treatment, the patient has consented to  Procedure(s): LAPAROSCOPIC CHOLECYSTECTOMY WITH INTRAOPERATIVE CHOLANGIOGRAM (N/A) as a surgical intervention .    The patient's history has been reviewed, patient examined, no change in status, stable for surgery.  I have reviewed the patient's chart and labs.  Questions were answered to the patient's satisfaction.     Cornell Gaber H

## 2012-10-07 NOTE — Preoperative (Signed)
Beta Blockers   Reason not to administer Beta Blockers:Not Applicable 

## 2012-10-07 NOTE — Op Note (Addendum)
10/07/2012  2:28 PM  PATIENT:  Kaitlyn Simmons, 67 y.o., female, MRN: [id number]  PREOP DIAGNOSIS:  gallbladder disease  POSTOP DIAGNOSIS:   Chronic cholecystitis, cholelithiasis, choledocholithiasis, prominent portal triad lymph nodes.  PROCEDURE:   Procedure(s): LAPAROSCOPIC CHOLECYSTECTOMY WITH INTRAOPERATIVE CHOLANGIOGRAM. Excisional biopsy of cystic duct lymph node.  SURGEON:   Ovidio Kin, M.D.  ASSISTANT:   Sheron Nightingale, M.D.  ANESTHESIA:   general  Anesthesiologist: Phillips Grout, MD CRNA: Delphia Grates, CRNA  General  ASA: 3  EBL:  < 75 cc ml  BLOOD ADMINISTERED: none  DRAINS: none   LOCAL MEDICATIONS USED:   30 cc 1/4% marcaine  SPECIMEN:   Gall bladder and cystic duct lymph node  COUNTS CORRECT:  YES  INDICATIONS FOR PROCEDURE:  Kaitlyn Simmons is a 67 y.o. (DOB: [birth year]) white  female whose primary care physician is Park Pope, MD and comes for cholecystectomy.   The indications and risks of the gall bladder surgery were explained to the patient.  The risks include, but are not limited to, infection, bleeding, common bile duct injury and open surgery.  SURGERY:  The patient was taken to room #1 at [facility].  The abdomen was prepped with chloroprep.  The patient was given 2 gm Ancef at the beginning of the operation.   A time out was held and the surgical checklist run.   The abdomen was accessed with a 5 mm Ethicon Optiview in the RUQ.  Four additional trocars were inserted: a 10 mm trocar in the sub-xiphoid location, a 5 mm trocar just below the umbilicus, a 5 mm trocar mid way between the xiphoid bone and the umbilicus, and a 5 mm trocar in the right lateral subcostal area.   The abdomen was explored and the liver, stomach, and bowel that could be seen were unremarkable.   The gall bladder was heavily covered with fat.  This is consistent with chronic gall bladder disease.  Also in dissecting out the cystic duct, the patient had several  prominent lymph nodes.  One cystic duct node was excised and sent as a separate specimen.  The gall bladder was  grasped and rotated cephalad.  Disssection was carried down to the gall bladder/cystic duct junction and the cystic duct isolated.  The cystic duct was very large and a endoclip would not go across the who duct.  A clip was placed on the gall bladder side of the cystic duct to occlude this as best as possible.   I made an incision in the cystic duct and extracted one gall stone impacted in the cystic duct.  An intra-operative cholangiogram was shot.   The intra-operative cholangiogram was shot using a cut off Taut catheter placed through a 14 gauge angiocath in the RUQ.  The Taut catheter was inserted in the cut cystic duct and secured with an endoclip.  A cholangiogram was shot with 10 cc of 1/2 strength Omnipaque.  Using fluoroscopy, the cholangiogram showed the flow of contrast into the common bile duct, up the hepatic radicals, and into the duodenum.  There was one and probably two filling defects in the distal common bile duct consistent with choledocholithiasis.   The Taut catheter was removed.  The cystic duct was too large to clip and I placed 2 PDS endoloops around the cystic duct.  There were at least 2 branches of the cystic artery which had been identified and clipped.  The gall bladder was bluntly and sharpley dissected from the gall bladder bed.  After the gall bladder was removed from the liver, the gall bladder bed and Triangle of Calot were inspected.  There was no bleeding or bile leak.  The gall bladder was placed in a endocatch bag and delivered through the subxiphoid incision.  The abdomen was irrigated with 2,000 cc saline.   The trocars were then removed.  I infiltrated 30 cc of 1/4% Marcaine into the incisions.  Each skin incision was closed with 5-0 vicryl.  The skin was painted with Dermabond.  The patient's sponge and needle count were correct.  The patient was  transported to the RR in good condition.  I will consult gastroenterology to deal with the choledocholithiasis.  Discussed with daughter Clydie Braun and family.  Ovidio Kin, MD, [facility] Surgery Pager: [phone] Office phone:  [phone]

## 2012-10-07 NOTE — Transfer of Care (Signed)
Immediate Anesthesia Transfer of Care Note  Patient: Kaitlyn Simmons  Procedure(s) Performed: Procedure(s): LAPAROSCOPIC CHOLECYSTECTOMY WITH INTRAOPERATIVE CHOLANGIOGRAM (N/A)  Patient Location: PACU  Anesthesia Type:General  Level of Consciousness: awake, alert  and patient cooperative  Airway & Oxygen Therapy: Patient Spontanous Breathing and Patient connected to face mask oxygen  Post-op Assessment: Report given to PACU RN and Post -op Vital signs reviewed and stable  Post vital signs: Reviewed and stable  Complications: No apparent anesthesia complications

## 2012-10-07 NOTE — Anesthesia Postprocedure Evaluation (Signed)
  Anesthesia Post-op Note  Patient: Kaitlyn Simmons  Procedure(s) Performed: Procedure(s) (LRB): LAPAROSCOPIC CHOLECYSTECTOMY WITH INTRAOPERATIVE CHOLANGIOGRAM (N/A)  Patient Location: PACU  Anesthesia Type: General  Level of Consciousness: awake and alert   Airway and Oxygen Therapy: Patient Spontanous Breathing  Post-op Pain: mild  Post-op Assessment: Post-op Vital signs reviewed, Patient's Cardiovascular Status Stable, Respiratory Function Stable, Patent Airway and No signs of Nausea or vomiting  Last Vitals:  Filed Vitals:   10/07/12 1614  BP: 114/79  Pulse: 54  Temp: 36.5 C  Resp: 14    Post-op Vital Signs: stable   Complications: No apparent anesthesia complications

## 2012-10-07 NOTE — Anesthesia Preprocedure Evaluation (Addendum)
Anesthesia Evaluation  Patient identified by MRN, date of birth, ID band Patient awake    Reviewed: Allergy & Precautions, H&P , NPO status , Patient's Chart, lab work & pertinent test results  Airway       Dental  (+) Dental Advisory Given   Pulmonary shortness of breath and with exertion, former smoker,          Cardiovascular hypertension, Pt. on medications + CAD, + Past MI, + Cardiac Stents and +CHF  Echo 10/2011 Left ventricle: The cavity size was mildly dilated. Wall thickness was increased in a pattern of mild LVH. Systolic function was mildly reduced. The estimated ejection fraction was in the range of 45% to 50%. There is moderate hypokinesis of the mid-distalinferior myocardium. There is moderate hypokinesis of the mid-distalanteroseptal and apical myocardium. Doppler parameters are consistent with abnormal left ventricular relaxation (grade 1 diastolic  dysfunction). Doppler parameters are consistent with high ventricular filling pressure. - Mitral valve: Calcified annulus. - Left atrium: The atrium was moderately dilated. - Atrial septum: No defect or patent foramen ovale was   identified.    Neuro/Psych PSYCHIATRIC DISORDERS negative neurological ROS     GI/Hepatic Neg liver ROS, GERD-  Medicated,  Endo/Other  negative endocrine ROS  Renal/GU negative Renal ROS     Musculoskeletal negative musculoskeletal ROS (+)   Abdominal   Peds  Hematology negative hematology ROS (+)   Anesthesia Other Findings   Reproductive/Obstetrics negative OB ROS                       Anesthesia Physical Anesthesia Plan  ASA: III  Anesthesia Plan: General   Post-op Pain Management:    Induction: Intravenous  Airway Management Planned: Oral ETT  Additional Equipment:   Intra-op Plan:   Post-operative Plan: Extubation in OR  Informed Consent: I have reviewed the patients History and Physical,  chart, labs and discussed the procedure including the risks, benefits and alternatives for the proposed anesthesia with the patient or authorized representative who has indicated his/her understanding and acceptance.   Dental advisory given  Plan Discussed with: CRNA  Anesthesia Plan Comments:         Anesthesia Quick Evaluation

## 2012-10-08 ENCOUNTER — Observation Stay (HOSPITAL_COMMUNITY): Payer: Medicare Other

## 2012-10-08 ENCOUNTER — Encounter (HOSPITAL_COMMUNITY): Payer: Self-pay | Admitting: Nurse Practitioner

## 2012-10-08 ENCOUNTER — Encounter (HOSPITAL_COMMUNITY)
Admission: RE | Disposition: A | Discharge status: Home / Self Care | Payer: Self-pay | Source: Ambulatory Visit | Attending: Surgery

## 2012-10-08 DIAGNOSIS — K319 Disease of stomach and duodenum, unspecified: Secondary | ICD-10-CM | POA: Diagnosis present

## 2012-10-08 DIAGNOSIS — K8050 Calculus of bile duct without cholangitis or cholecystitis without obstruction: Secondary | ICD-10-CM

## 2012-10-08 DIAGNOSIS — R932 Abnormal findings on diagnostic imaging of liver and biliary tract: Secondary | ICD-10-CM | POA: Diagnosis present

## 2012-10-08 HISTORY — PX: ERCP: SHX5425

## 2012-10-08 LAB — COMPREHENSIVE METABOLIC PANEL
AST: 30 U/L (ref 0–37)
BUN: 14 mg/dL (ref 6–23)
CO2: 25 mEq/L (ref 19–32)
Calcium: 9.2 mg/dL (ref 8.4–10.5)
Creatinine, Ser: 1.13 mg/dL — ABNORMAL HIGH (ref 0.50–1.10)
GFR calc non Af Amer: 49 mL/min — ABNORMAL LOW (ref >90)

## 2012-10-08 SURGERY — ERCP, WITH INTERVENTION IF INDICATED
Anesthesia: Moderate Sedation

## 2012-10-08 MED ORDER — SODIUM CHLORIDE 0.9 % IV SOLN
1.5000 g | Freq: Once | INTRAVENOUS | Status: AC
  Filled 2012-10-08: qty 1.5

## 2012-10-08 MED ORDER — DIPHENHYDRAMINE HCL 50 MG/ML IJ SOLN
INTRAMUSCULAR | Status: DC | PRN
  Administered 2012-10-08: 25 mg via INTRAVENOUS

## 2012-10-08 MED ORDER — MIDAZOLAM HCL 10 MG/2ML IJ SOLN
INTRAMUSCULAR | Status: AC
  Filled 2012-10-08: qty 4

## 2012-10-08 MED ORDER — FENTANYL CITRATE 0.05 MG/ML IJ SOLN
INTRAMUSCULAR | Status: DC | PRN
  Administered 2012-10-08 (×4): 25 ug via INTRAVENOUS

## 2012-10-08 MED ORDER — SODIUM CHLORIDE 0.9 % IV SOLN
INTRAVENOUS | Status: DC | PRN
  Administered 2012-10-08: 16:00:00

## 2012-10-08 MED ORDER — FENTANYL CITRATE 0.05 MG/ML IJ SOLN
INTRAMUSCULAR | Status: AC
  Filled 2012-10-08: qty 4

## 2012-10-08 MED ORDER — GLUCAGON HCL (RDNA) 1 MG IJ SOLR
INTRAMUSCULAR | Status: AC
  Filled 2012-10-08: qty 1

## 2012-10-08 MED ORDER — SODIUM CHLORIDE 0.9 % IV SOLN
1.5000 g | Freq: Once | INTRAVENOUS | Status: DC
  Administered 2012-10-08: 1.5 g via INTRAVENOUS
  Filled 2012-10-08: qty 1.5

## 2012-10-08 MED ORDER — INDOMETHACIN 50 MG RE SUPP
100.0000 mg | Freq: Once | RECTAL | Status: AC
  Administered 2012-10-08: 100 mg via RECTAL
  Filled 2012-10-08: qty 2

## 2012-10-08 MED ORDER — GLUCAGON HCL (RDNA) 1 MG IJ SOLR
INTRAMUSCULAR | Status: DC | PRN
  Administered 2012-10-08 (×2): .5 mg via INTRAVENOUS

## 2012-10-08 MED ORDER — DIPHENHYDRAMINE HCL 50 MG/ML IJ SOLN
INTRAMUSCULAR | Status: AC
  Filled 2012-10-08: qty 1

## 2012-10-08 MED ORDER — MIDAZOLAM HCL 10 MG/2ML IJ SOLN
INTRAMUSCULAR | Status: DC | PRN
  Administered 2012-10-08 (×3): 1 mg via INTRAVENOUS
  Administered 2012-10-08 (×2): 2 mg via INTRAVENOUS

## 2012-10-08 MED ORDER — SODIUM CHLORIDE 0.9 % IV SOLN
INTRAVENOUS | Status: DC
  Administered 2012-10-08: 15:00:00 via INTRAVENOUS

## 2012-10-08 NOTE — Progress Notes (Signed)
I have reviewed Kaitlyn Simmons's chart and examined the Kaitlyn Simmons and reviewed the IOC from 10/07/2012. Patient having similar pain this morning as prior to lap chole, but LFT's are normal. IOC shows  2 medium size filling defects in distal CBD, one of them is mobile. CBD is normal size, not obstructed. I have discussed ERCP/shincterotomy and stone extraction with the Kaitlyn Simmons . And she understands and agrees with the plan to proceed today at 3.30 pm by Dr Leone Payor.. Full consult note pending.

## 2012-10-08 NOTE — Op Note (Signed)
[facility] [address] [place], [address]   ERCP PROCEDURE REPORT  PATIENT: Kaitlyn Simmons, Kaitlyn Simmons.  MR# :[id number] BIRTHDATE: 03/31/1945  GENDER: Female ENDOSCOPIST: Iva Boop, MD, [facility] REFERRED BY: Ovidio Kin, M.D. PROCEDURE DATE:  10/08/2012 PROCEDURE:   ERCP with biopsy ASA CLASS:   Class III INDICATIONS:abnormal intraoperative cholangiogram  ? stone MEDICATIONS: Fentanyl 100 mcg IV, Versed 7 mg IV, Benadryl 25 mg IV, and Glucagon 1 mg IV   Unmasyn 1.5 g IV TOPICAL ANESTHETIC: Cetacaine Spray  DESCRIPTION OF PROCEDURE:   After the risks benefits and alternatives of the procedure were thoroughly explained, informed consent was obtained.  The Pentax ERCP [id number]  endoscope was introduced through the mouth  and advanced to the second portion of the duodenum . There was a nodular and polypoid appearance to the bulb - ? polyp - biopsied. The stomach was normal and GE junction normal.  There was abnormal anatomy in D2 - the papilla was on the edge of a moderate to large duodenal diverticulum and there were redundant folds. After some time looking I found the papilla but it was not in good position - the tip of the catheter slid accross. More manipulation allowed seating of sphincterotome. wire was passed and left in pancreatic duct and a second 035 wire and an 025 wire were used but I could never get the wire to access the bile duct as the floppy papilla, diverticulum and poor angle did not allow. Bile drained during the entire procedure and the ampullary orifice was patent.g]   The scope was then completely withdrawn from the patient and the procedure terminated.     COMPLICATIONS: .  There were no complications.  ENDOSCOPIC IMPRESSION: Periampullary diverticulum with papilla on edge ? duodenitis vs. polyp in bulb - biopsied Pancreatic duct accessed with wire, bile duct not accessed. I do think, in retropspect with normal LFT's and review of  IOC knowing now that there is a periampullary diverticulum that the suspected stone on IOC is the diverticulum. RECOMMENDATIONS: Recheck labs in AM MRCP tomorrow when alert enough to hold breath- if negative, stop evaluation, if unclear defer ERCP, consider EUS     eSigned:  Iva Boop, MD, [facility] 10/08/2012 5:07 PM

## 2012-10-08 NOTE — Progress Notes (Signed)
General Surgery Note  LOS: 1 day  POD -  1 Day Post-Op  Assessment/Plan: 1.  LAPAROSCOPIC CHOLECYSTECTOMY WITH INTRAOPERATIVE CHOLANGIOGRAM, lymph node biopsy - 10/07/2012 - D. Jaedin Regina  Doing well from surgery.  2.  Choledocholithiasis - seen by Dr. Juanda Chance  LFT's overall better  For ERCP later today by Dr. Leone Payor  3.  Short term memory problems 4. CAD with history of MI   History of coronary stents   Sees Dr. Becky Augusta  5. History of heart failure  6. HTN  7. Ischemic cardiomyopathy.  8. GERD  9. On Plavix - held 10. Morbid obesity - BMI - 39 11.  DVT prophylaxis - SQ Heparin  Subjective:  Does not remember much about surgery.  Doing well.  Son in room. Objective:   Filed Vitals:   10/08/12 0615  BP: 103/68  Pulse: 60  Temp: 97.9 F (36.6 C)  Resp: 16     Intake/Output from previous day:  08/12 0701 - 08/13 0700 In: 2245 [P.O.:600; I.V.:1645] Out: 825 [Urine:800; Blood:25]  Intake/Output this shift:      Physical Exam:   General: WN older obese WF who is alert and oriented.    HEENT: Normal. Pupils equal. .   Lungs: Clear   Abdomen: Soft,  BS present   Wound: Look good   Lab Results:   No results found for this basename: WBC, HGB, HCT, PLT,  in the last 72 hours  BMET   Recent Labs  10/08/12 0430  NA 135  K 4.2  CL 101  CO2 25  GLUCOSE 150*  BUN 14  CREATININE 1.13*  CALCIUM 9.2    PT/INR  No results found for this basename: LABPROT, INR,  in the last 72 hours  ABG  No results found for this basename: PHART, PCO2, PO2, HCO3,  in the last 72 hours   Studies/Results:  Dg Cholangiogram Operative  10/07/2012   *RADIOLOGY REPORT*  Clinical Data: Cholecystectomy  INTRAOPERATIVE CHOLANGIOGRAM  Technique:  Multiple fluoroscopic spot radiographs were obtained during intraoperative cholangiogram and are submitted for interpretation post-operatively.  Comparison: None.  Findings:  At least one large   nonocclusive mobile filling defect in the common bile  duct just proximal to the ampulla.  Intrahepatic ducts are incompletely visualized, appearing decompressed centrally. Contrast passes into the duodenum.  IMPRESSION  Filling defect in the distal common bile duct without obstruction, suggesting retained stone versus blood clot or air bubble. Consider ERCP for further assessment.   Original Report Authenticated By: D. Andria Rhein, MD     Anti-infectives:   Anti-infectives   Start     Dose/Rate Route Frequency Ordered Stop   10/08/12 1330  ampicillin-sulbactam (UNASYN) 1.5 g in sodium chloride 0.9 % 50 mL IVPB     1.5 g 100 mL/hr over 30 Minutes Intravenous  Once 10/08/12 0817     10/07/12 0934  ceFAZolin (ANCEF) IVPB 2 g/50 mL premix     2 g 100 mL/hr over 30 Minutes Intravenous On call to O.R. 10/07/12 0934 10/07/12 1242      Ovidio Kin, MD, FACS Pager: [phone],   [facility] Office: [phone] 10/08/2012

## 2012-10-08 NOTE — Consult Note (Signed)
[facility] Consultation  Referring Provider:  [facility]    Primary Care Physician:  Park Pope, MD Primary Gastroenterologist: none        Reason for Consultation:  choledocholithiasis         HPI:   Kaitlyn Simmons is a 67 y.o. female with history of CAD/ STEMI July 2012 s/p stent placement 7/12. She is on Plavix at home. Patient also has a history of ischemic cardiomyopathy with EF 45-50% in Sept 2013. Patient admitted yesterday with following elective lap chole with abnormal IOC. Cholecystectomy was done for symptomatic cholelithiasis. Patient was admitted to hospital last month by cardiology for nausea and chest pain. Initial concern was for NSTEMI but cardiology eventually decided that was unlikely. Upon further questioning patient described postprandial epigastric pain. Labs revealed transaminitis (AST 205/ALT 171) and ultrasound showed gallstones with normal caliber CBD. Acute hepatitis panel was negative. She was discharged home with outpatient surgical evaluation. Patient underwent lap chole yesterday, we were asked to see for abnormal IOC.  Patient denies any chronic GI problems. BMs normal, no blood in stool. No significant GERD symptoms. She is on PPI at home.  No FMH of liver disease of colon cancer. She has never had a colon cancer screening.    Past Medical History  Diagnosis Date  . CAD (coronary artery disease)     a. s/p [place] STEMI 7/11 => tx with BMS to RCA (tx at [facility]);  b.  AL STEMI 7/12 => LHC: mRCA 100% ==> PCI with BMS to RCA; LM 10%, pCFX 50%, OM1 10%, pLAD 50%, mLAD 99%, 70%  ==>  c. staged PCI: BMS to  LAD (tx at [facility])  . Chronic systolic heart failure   . HTN (hypertension)   . Hyperlipidemia   . Ischemic cardiomyopathy     a. EF 30-35% at time of AL STEMI 7/12;   b. IMPROVED by f/u Echo 9/13:  mild LVH, EF 45-50%, mid to dist Inf, AS, Apical HK, Gr 1 diast dysfn, MAC, mod LAE  . Cataracts, bilateral   . CHF (congestive heart failure)    . GERD (gastroesophageal reflux disease)   . STEMI (ST elevation myocardial infarction) 2011; 2012    [place]; [place]  . Poor short-term memory   . Dementia     Past Surgical History  Procedure Laterality Date  . Tonsillectomy and adenoidectomy  1980's  . Coronary angioplasty with stent placement  2011; 2012    RCA; RCA & LAD  . Dilation and curettage of uterus  1970's  . Cataract extraction w/ intraocular lens implant Left ~ 2008    Family History  Problem Relation Age of Onset  . Heart attack Father     DECEASED  . Stroke Father     DECEASED  . Leukemia Mother     DECEASED  . Heart attack Sister     ALIVE  . Hypertension Sister      History  Substance Use Topics  . Smoking status: Former Smoker -- 2.00 packs/day for 30 years    Types: Cigarettes    Quit date: 10/25/2002  . Smokeless tobacco: Never Used  . Alcohol Use: No    Prior to Admission medications   Medication Sig Start Date End Date Taking? Authorizing Provider  aspirin EC 81 MG tablet Take 81 mg by mouth daily.   Yes Historical Provider, MD  atorvastatin (LIPITOR) 80 MG tablet Take 80 mg by mouth every morning.    Yes Historical Provider, MD  carvedilol (COREG) 25 MG tablet Take 1 tablet (25 mg total) by mouth 2 (two) times daily with a meal. 02/04/12  Yes Vesta Mixer, MD  FLUoxetine (PROZAC) 10 MG capsule Take 10 mg by mouth every morning.    Yes Historical Provider, MD  furosemide (LASIX) 40 MG tablet Take 40 mg by mouth every morning.   Yes Historical Provider, MD  lisinopril (PRINIVIL,ZESTRIL) 10 MG tablet Take 10 mg by mouth every morning.   Yes Historical Provider, MD  nitroGLYCERIN (NITROSTAT) 0.4 MG SL tablet Place 0.4 mg under the tongue every 5 (five) minutes as needed for chest pain.   Yes Historical Provider, MD  pantoprazole (PROTONIX) 40 MG tablet Take 40 mg by mouth daily.     Yes Historical Provider, MD  clopidogrel (PLAVIX) 75 MG tablet Take 1 tablet (75 mg total) by mouth daily. 08/15/11    Vesta Mixer, MD    Current Facility-Administered Medications  Medication Dose Route Frequency Provider Last Rate Last Dose  . ampicillin-sulbactam (UNASYN) 1.5 g in sodium chloride 0.9 % 50 mL IVPB  1.5 g Intravenous Once Hart Carwin, MD      . antiseptic oral rinse (BIOTENE) solution 15 mL  15 mL Mouth Rinse q12n4p Kandis Cocking, MD      . atorvastatin (LIPITOR) tablet 80 mg  80 mg Oral QHS Kandis Cocking, MD   80 mg at 10/07/12 2226  . carvedilol (COREG) tablet 25 mg  25 mg Oral BID WC Kandis Cocking, MD   25 mg at 10/07/12 1851  . chlorhexidine (PERIDEX) 0.12 % solution 15 mL  15 mL Mouth Rinse BID Kandis Cocking, MD   15 mL at 10/07/12 2225  . dextrose 5 % and 0.45 % NaCl with KCl 20 mEq/L infusion   Intravenous Continuous Kandis Cocking, MD 100 mL/hr at 10/08/12 0329 100 mL at 10/08/12 0329  . FLUoxetine (PROZAC) capsule 10 mg  10 mg Oral q morning - 10a Kandis Cocking, MD      . furosemide (LASIX) tablet 40 mg  40 mg Oral q morning - 10a Kandis Cocking, MD      . heparin injection 5,000 Units  5,000 Units Subcutaneous Q8H Kandis Cocking, MD   5,000 Units at 10/07/12 2227  . HYDROcodone-acetaminophen (NORCO/VICODIN) 5-325 MG per tablet 1-2 tablet  1-2 tablet Oral Q4H PRN Kandis Cocking, MD      . lactated ringers infusion   Intravenous Continuous Delphia Grates, CRNA      . lisinopril (PRINIVIL,ZESTRIL) tablet 10 mg  10 mg Oral q morning - 10a Kandis Cocking, MD      . morphine 2 MG/ML injection 1-3 mg  1-3 mg Intravenous Q2H PRN Kandis Cocking, MD   2 mg at 10/08/12 0756  . nitroGLYCERIN (NITROSTAT) SL tablet 0.4 mg  0.4 mg Sublingual Q5 min PRN Kandis Cocking, MD      . ondansetron [facility]) tablet 4 mg  4 mg Oral Q6H PRN Kandis Cocking, MD       Or  . ondansetron [facility]) injection 4 mg  4 mg Intravenous Q6H PRN Kandis Cocking, MD      . pantoprazole (PROTONIX) EC tablet 40 mg  40 mg Oral Daily Kandis Cocking, MD   40 mg at 10/07/12 1833    Allergies as of 09/29/2012  . (No  Known Allergies)    Review of Systems:    All systems reviewed  and negative except where noted in HPI.   Physical Exam:  Vital signs in last 24 hours: Temp:  [97.4 F (36.3 C)-98.2 F (36.8 C)] 97.9 F (36.6 C) (08/13 0615) Pulse Rate:  [45-75] 60 (08/13 0615) Resp:  [11-18] 16 (08/13 0615) BP: (103-125)/(47-79) 103/68 mmHg (08/13 0615) SpO2:  [96 %-100 %] 96 % (08/13 0615) Weight:  [229 lb 6.4 oz (104.055 kg)] 229 lb 6.4 oz (104.055 kg) (08/12 1740) Last BM Date: 10/07/12 General:   Pleasant white female in NAD Head:  Normocephalic and atraumatic. Eyes:   No icterus.   Conjunctiva pink. Ears:  Normal auditory acuity. Neck:  Supple; no masses felt Lungs:  Respirations even and unlabored. Lungs clear to auscultation bilaterally.   No wheezes, crackles, or rhonchi.  Heart:  Regular rate and rhythm;  murmur heard. Abdomen:  Soft, obese, nontender. A few bowel sounds heard. No appreciable masses or hepatomegaly.  Rectal:  Not performed.  Msk:  Symmetrical without gross deformities.  Extremities:  Without edema. Neurologic:  Alert and  oriented x4;  grossly normal neurologically. Skin:  Intact without significant lesions or rashes. Cervical Nodes:  No significant cervical adenopathy. Psych:  Alert and cooperative. Normal affect.   BMET  Recent Labs  10/08/12 0430  NA 135  K 4.2  CL 101  CO2 25  GLUCOSE 150*  BUN 14  CREATININE 1.13*  CALCIUM 9.2   LFT  Recent Labs  10/08/12 0430  PROT 6.4  ALBUMIN 3.1*  AST 30  ALT 30  ALKPHOS 103  BILITOT 0.7    STUDIES: Dg Cholangiogram Operative  10/07/2012   *RADIOLOGY REPORT*  Clinical Data: Cholecystectomy  INTRAOPERATIVE CHOLANGIOGRAM  Technique:  Multiple fluoroscopic spot radiographs were obtained during intraoperative cholangiogram and are submitted for interpretation post-operatively.  Comparison: None.  Findings:  At least one large   nonocclusive mobile filling defect in the common bile duct just proximal to the  ampulla.  Intrahepatic ducts are incompletely visualized, appearing decompressed centrally. Contrast passes into the duodenum.  IMPRESSION  Filling defect in the distal common bile duct without obstruction, suggesting retained stone versus blood clot or air bubble. Consider ERCP for further assessment.   Original Report Authenticated By: D. Andria Rhein, MD   PREVIOUS ENDOSCOPIES:            none   Impression / Plan:   34. 67 year old female with choledocholithiasis following lap cholecystectomy yesterday for symptomatic cholelithiasis. For further evaluation and treatment she will need ERCP with stone extraction. Procedure explained including risks and benefits and patient agrees to proceed. ERCP will be done today by Dr. Leone Payor. Patient has been NPO. She is getting Unasyn.   2. CAD/ STEMI in July 2012, s/p stent placement 7/12. Her Plavix has been on hold.   3. Ischemic cardiomyopathy. EF 9/13 was 45-50% in Sept 2013.   4. Depression, on Prozac.   5. ? GERD. She takes PPI at home but denies history of any significant GERD symptoms  Thanks   LOS: 1 day   Willette Cluster  10/08/2012, 9:15 AM  . Attending MD note:   I have reviewed the above note, examined the patient and agree with plan of treatment.I have spoken to Dr Leone Payor earlier today. Patient was not in EPIC till later last night so we could not enter the GI consult note till she was transferred from outpatient status to inpatient.Please see results of ERCP.  [facility] Pager # [phone]

## 2012-10-09 ENCOUNTER — Encounter (HOSPITAL_COMMUNITY): Payer: Self-pay | Admitting: Internal Medicine

## 2012-10-09 ENCOUNTER — Observation Stay (HOSPITAL_COMMUNITY): Payer: Medicare Other

## 2012-10-09 DIAGNOSIS — R7989 Other specified abnormal findings of blood chemistry: Secondary | ICD-10-CM

## 2012-10-09 LAB — COMPREHENSIVE METABOLIC PANEL
ALT: 24 U/L (ref 0–35)
AST: 23 U/L (ref 0–37)
CO2: 30 mEq/L (ref 19–32)
Chloride: 102 mEq/L (ref 96–112)
Creatinine, Ser: 1.35 mg/dL — ABNORMAL HIGH (ref 0.50–1.10)
GFR calc non Af Amer: 39 mL/min — ABNORMAL LOW (ref >90)
Sodium: 136 mEq/L (ref 135–145)
Total Bilirubin: 1.1 mg/dL (ref 0.3–1.2)

## 2012-10-09 NOTE — Progress Notes (Signed)
Pt for d/c home today after cleared by Dr. Ezzard Standing & GI MD s/p MRCP. IV d/c'd. Dermabond dressing to abdomen CDI. D/C instructions reviewed with pt with verbalized understanding. Son at bedside to assist with d/c.

## 2012-10-09 NOTE — Progress Notes (Signed)
[facility] Progress Note   Subjective  feels okay, no abdominal pain    Objective   Vital signs in last 24 hours: Temp:  [97.9 F (36.6 C)-100.3 F (37.9 C)] 98.3 F (36.8 C) (08/14 0526) Pulse Rate:  [49-85] 67 (08/14 0526) Resp:  [14-28] 18 (08/14 0526) BP: (105-190)/(43-159) 128/52 mmHg (08/14 0526) SpO2:  [76 %-99 %] 91 % (08/14 0526) Last BM Date: 10/07/12 General:    white female in NAD Heart:  Regular rate and rhythm; no murmurs Lungs: Respirations even and unlabored, lungs CTA bilaterall Abdomen:  Soft, nontender and nondistended. Normal bowel sounds. Neurologic:  Alert and oriented,  grossly normal neurologically. Psych:  Cooperative. Normal mood and affect.  BMET  Recent Labs  10/08/12 0430 10/09/12 0434  NA 135 136  K 4.2 4.1  CL 101 102  CO2 25 30  GLUCOSE 150* 107*  BUN 14 13  CREATININE 1.13* 1.35*  CALCIUM 9.2 9.1   LFT  Recent Labs  10/09/12 0434  PROT 6.4  ALBUMIN 3.0*  AST 23  ALT 24  ALKPHOS 96  BILITOT 1.1    Studies/Results: Dg Cholangiogram Operative  10/07/2012   *RADIOLOGY REPORT*  Clinical Data: Cholecystectomy  INTRAOPERATIVE CHOLANGIOGRAM  Technique:  Multiple fluoroscopic spot radiographs were obtained during intraoperative cholangiogram and are submitted for interpretation post-operatively.  Comparison: None.  Findings:  At least one large   nonocclusive mobile filling defect in the common bile duct just proximal to the ampulla.  Intrahepatic ducts are incompletely visualized, appearing decompressed centrally. Contrast passes into the duodenum.  IMPRESSION  Filling defect in the distal common bile duct without obstruction, suggesting retained stone versus blood clot or air bubble. Consider ERCP for further assessment.   Original Report Authenticated By: D. Andria Rhein, MD   Mr Mrcp  10/09/2012   *RADIOLOGY REPORT*  Clinical Data:  Status post laparoscopic cholecystectomy 10/07/2012, ERCP 10/08/2012, abdominal  pain/nausea, evaluate for residual stone  MRI ABDOMEN WITHOUT CONTRAST (MRCP)  Technique: Multiplanar multisequence MR imaging of the abdomen was performed, including heavily T2-weighted images of the biliary and pancreatic ducts.  Three-dimensional MR images were rendered by post processing of the original MR data.  Comparison:  Ultrasound abdomen dated 09/12/2012.  Intraoperative cholangiogram dated 10/07/2012.  Findings:  Motion degraded images.  Cardiomegaly.  Trace left pleural effusion.  Unenhanced liver, spleen, pancreas, and left adrenal gland are within normal limits.  2.6 x 2.0 cm right adrenal nodule with signal loss on opposed phase imaging, reflecting an adrenal adenoma.  Status post cholecystectomy with postsurgical fluid in the surgical bed (series 3/image 28).  No drainable fluid collection/abscess.  Common duct is mildly dilated, measuring 9 mm (series 4/image 51). Three filling defects are present within the distal common duct (series 10/image 1), suspicious for common duct stones, measuring up to 9 mm (series 4/image 48).  Additional fluid/stranding along medial right renal fossa and second/third portion of the duodenum (series 3/image 34), likely postsurgical.  No suspicious abdominal lymphadenopathy.  No focal osseous lesions.  IMPRESSION: Status post cholecystectomy with postsurgical changes/fluid.  No drainable fluid collection/abscess.  Mildly dilated common duct, measuring 9 mm.  Suspected choledocholithiasis with three filling defects in the distal common duct measuring up to 9 mm.   Original Report Authenticated By: Charline Bills, M.D.   Mr 3d Recon At Scanner  10/09/2012   *RADIOLOGY REPORT*  Clinical Data:  Status post laparoscopic cholecystectomy 10/07/2012, ERCP 10/08/2012, abdominal pain/nausea, evaluate for residual stone  MRI ABDOMEN WITHOUT CONTRAST (MRCP)  Technique: Multiplanar multisequence MR imaging of the abdomen was performed, including heavily T2-weighted images of the  biliary and pancreatic ducts.  Three-dimensional MR images were rendered by post processing of the original MR data.  Comparison:  Ultrasound abdomen dated 09/12/2012.  Intraoperative cholangiogram dated 10/07/2012.  Findings:  Motion degraded images.  Cardiomegaly.  Trace left pleural effusion.  Unenhanced liver, spleen, pancreas, and left adrenal gland are within normal limits.  2.6 x 2.0 cm right adrenal nodule with signal loss on opposed phase imaging, reflecting an adrenal adenoma.  Status post cholecystectomy with postsurgical fluid in the surgical bed (series 3/image 28).  No drainable fluid collection/abscess.  Common duct is mildly dilated, measuring 9 mm (series 4/image 51). Three filling defects are present within the distal common duct (series 10/image 1), suspicious for common duct stones, measuring up to 9 mm (series 4/image 48).  Additional fluid/stranding along medial right renal fossa and second/third portion of the duodenum (series 3/image 34), likely postsurgical.  No suspicious abdominal lymphadenopathy.  No focal osseous lesions.  IMPRESSION: Status post cholecystectomy with postsurgical changes/fluid.  No drainable fluid collection/abscess.  Mildly dilated common duct, measuring 9 mm.  Suspected choledocholithiasis with three filling defects in the distal common duct measuring up to 9 mm.   Original Report Authenticated By: Charline Bills, M.D.     Assessment / Plan:   67 year old female with cholelithiasis / choledocholithiasis, s/p lap cholecystectomy for symptomatic cholelithiasis. Went for ERCP yesterday but periampullary diveticulum with papilla seen (biopsied), ERCP not done. Subsequent MRCP shows CBD filling defects. LFTs normal today. Patient can restart Plavix and be discharged when okay with surgery. Dr. Leone Payor will contact patient regarding next step.     Attending MD note:   I have reviewed the above note,  Discussed with Dr Leone Payor and agree with outpatient follow up ,  possible repeat procedure either locally or at [facility].  [facility] Pager # [phone]        LOS: 2 days   Willette Cluster  10/09/2012, 10:01 AM

## 2012-10-09 NOTE — Discharge Summary (Signed)
Physician Discharge Summary  Patient ID:  TANA TREFRY  MRN: [id number]  DOB/AGE: 10/03/1945 67 y.o.  Admit date: 10/07/2012 Discharge date: 10/09/2012  Discharge Diagnoses:  1. LAPAROSCOPIC CHOLECYSTECTOMY WITH INTRAOPERATIVE CHOLANGIOGRAM, lymph node biopsy - 10/07/2012 - D. Andron Marrazzo   Doing well from surgery.   2. Choledocholithiasis   LFT's - normal - 10/09/2012   ERCP by Dr. Leone Payor - 10/08/2012 - unable to clear CB duct   MRCP - 10/09/2012 - shows 3 CBD stones.  Dr. Leone Payor wants to review the case with partners who do ERCP and will get in touch with the patient. She can be discharged and restarted on Plavix.     3. Short term memory problems  4. CAD with history of MI   History of coronary stents  Sees Dr. Becky Augusta  5. History of heart failure  6. HTN  7. Ischemic cardiomyopathy.  8. GERD  9. On Plavix - held  10. Morbid obesity - BMI - 39  Operation: Procedure(s): 1. LAPAROSCOPIC CHOLECYSTECTOMY WITH INTRAOPERATIVE CHOLANGIOGRAM, lymph node biopsy - 10/07/2012 - D. Neco Kling  2.  ENDOSCOPIC RETROGRADE CHOLANGIOPANCREATOGRAPHY (ERCP)  - 10/08/2012 - C. Leone Payor  Discharged Condition: good  Hospital Course: MIRYAH RALLS is an 67 y.o. female whose primary care physician is Park Pope, MD and who was admitted 10/07/2012 with a chief complaint of gall bladder disease.   She was brought to the operating room on 10/07/2012 and underwent  LAPAROSCOPIC CHOLECYSTECTOMY WITH INTRAOPERATIVE CHOLANGIOGRAM, lymph node biopsy.  She was found to have commond duct stones on the cholagniogram.  Dr. Leone Payor saw the patient and tried to do an ERCP on 10/08/2012, but he was unable to clear the duct.  A MRCP was obtained 10/09/2012, that shows 3 probable CBD stones. The patients LFT's are normal, she has done well from surgery, and is ready for discharge.   Dr. Marvell Fuller office will get in touch with her for further follow up of the CBD stone.  I spoke to son, Laporshia Hogen, about findings. Cell:  [phone]  The discharge instructions were reviewed with the patient.  Consults: gastroenterology - Brodie/Gessner  Significant Diagnostic Studies: Results for orders placed during the hospital encounter of 10/07/12  COMPREHENSIVE METABOLIC PANEL      Result Value Range   Sodium 135  135 - 145 mEq/L   Potassium 4.2  3.5 - 5.1 mEq/L   Chloride 101  96 - 112 mEq/L   CO2 25  19 - 32 mEq/L   Glucose, Bld 150 (*) 70 - 99 mg/dL   BUN 14  6 - 23 mg/dL   Creatinine, Ser 7.84 (*) 0.50 - 1.10 mg/dL   Calcium 9.2  8.4 - 69.6 mg/dL   Total Protein 6.4  6.0 - 8.3 g/dL   Albumin 3.1 (*) 3.5 - 5.2 g/dL   AST 30  0 - 37 U/L   ALT 30  0 - 35 U/L   Alkaline Phosphatase 103  39 - 117 U/L   Total Bilirubin 0.7  0.3 - 1.2 mg/dL   GFR calc non Af Amer 49 (*) >90 mL/min   GFR calc Af Amer 57 (*) >90 mL/min  COMPREHENSIVE METABOLIC PANEL      Result Value Range   Sodium 136  135 - 145 mEq/L   Potassium 4.1  3.5 - 5.1 mEq/L   Chloride 102  96 - 112 mEq/L   CO2 30  19 - 32 mEq/L   Glucose, Bld 107 (*) 70 - 99  mg/dL   BUN 13  6 - 23 mg/dL   Creatinine, Ser 1.61 (*) 0.50 - 1.10 mg/dL   Calcium 9.1  8.4 - 09.6 mg/dL   Total Protein 6.4  6.0 - 8.3 g/dL   Albumin 3.0 (*) 3.5 - 5.2 g/dL   AST 23  0 - 37 U/L   ALT 24  0 - 35 U/L   Alkaline Phosphatase 96  39 - 117 U/L   Total Bilirubin 1.1  0.3 - 1.2 mg/dL   GFR calc non Af Amer 39 (*) >90 mL/min   GFR calc Af Amer 46 (*) >90 mL/min    Dg Chest 2 View  10/03/2012   *RADIOLOGY REPORT*  Clinical Data: Preop for cholecystectomy  CHEST - 2 VIEW  Comparison: None.  Findings: Borderline cardiomegaly.  No acute infiltrate or pleural effusion.  No pulmonary edema.  Degenerative changes are noted mid and lower thoracic spine.  IMPRESSION: No active disease.  Borderline cardiomegaly.  Degenerative changes thoracic spine.   Original Report Authenticated By: Natasha Mead, M.D.   Dg Cholangiogram Operative  10/07/2012   *RADIOLOGY REPORT*  Clinical Data:  Cholecystectomy  INTRAOPERATIVE CHOLANGIOGRAM  Technique:  Multiple fluoroscopic spot radiographs were obtained during intraoperative cholangiogram and are submitted for interpretation post-operatively.  Comparison: None.  Findings:  At least one large   nonocclusive mobile filling defect in the common bile duct just proximal to the ampulla.  Intrahepatic ducts are incompletely visualized, appearing decompressed centrally. Contrast passes into the duodenum.  IMPRESSION  Filling defect in the distal common bile duct without obstruction, suggesting retained stone versus blood clot or air bubble. Consider ERCP for further assessment.   Original Report Authenticated By: D. Andria Rhein, MD   US Abdomen Complete  09/12/2012   *RADIOLOGY REPORT*  Clinical Data:  Abnormal liver function tests.  COMPLETE ABDOMINAL ULTRASOUND  Comparison:  None.  Findings:  Gallbladder:  Cholelithiasis is present.  Largest gallstone measures 19 mm.  No sonographic Murphy's sign or findings of acute cholecystitis.  Gallbladder wall is within normal limits.  Common bile duct:  6 mm, normal.  Liver:  No focal lesion identified.  Within normal limits in parenchymal echogenicity.  IVC:  Appears normal.  Pancreas:  No focal abnormality seen.  Spleen:  8.8 cm.  Normal echotexture.  Right Kidney:  10.9 cm. Normal echotexture.  Normal central sinus echo complex.  No calculi or hydronephrosis.  Left Kidney:  11.2 cm. Normal echotexture.  Normal central sinus echo complex.  No calculi or hydronephrosis.  Abdominal aorta:  Abdominal aortic mural calcification.  No aneurysm.  2.5 cm.  Suboptimal visualization due to overlying bowel gas.  IMPRESSION:  1.  Cholelithiasis without cholecystitis. 2.  Normal appearance of the liver and common bile duct for age.   Original Report Authenticated By: Andreas Newport, M.D.   Mr Mrcp  10/09/2012   *RADIOLOGY REPORT*  Clinical Data:  Status post laparoscopic cholecystectomy 10/07/2012, ERCP 10/08/2012, abdominal  pain/nausea, evaluate for residual stone  MRI ABDOMEN WITHOUT CONTRAST (MRCP)  Technique: Multiplanar multisequence MR imaging of the abdomen was performed, including heavily T2-weighted images of the biliary and pancreatic ducts.  Three-dimensional MR images were rendered by post processing of the original MR data.  Comparison:  Ultrasound abdomen dated 09/12/2012.  Intraoperative cholangiogram dated 10/07/2012.  Findings:  Motion degraded images.  Cardiomegaly.  Trace left pleural effusion.  Unenhanced liver, spleen, pancreas, and left adrenal gland are within normal limits.  2.6 x 2.0 cm right adrenal nodule  with signal loss on opposed phase imaging, reflecting an adrenal adenoma.  Status post cholecystectomy with postsurgical fluid in the surgical bed (series 3/image 28).  No drainable fluid collection/abscess.  Common duct is mildly dilated, measuring 9 mm (series 4/image 51). Three filling defects are present within the distal common duct (series 10/image 1), suspicious for common duct stones, measuring up to 9 mm (series 4/image 48).  Additional fluid/stranding along medial right renal fossa and second/third portion of the duodenum (series 3/image 34), likely postsurgical.  No suspicious abdominal lymphadenopathy.  No focal osseous lesions.  IMPRESSION: Status post cholecystectomy with postsurgical changes/fluid.  No drainable fluid collection/abscess.  Mildly dilated common duct, measuring 9 mm.  Suspected choledocholithiasis with three filling defects in the distal common duct measuring up to 9 mm.   Original Report Authenticated By: Charline Bills, M.D.   Mr 3d Recon At Scanner  10/09/2012   *RADIOLOGY REPORT*  Clinical Data:  Status post laparoscopic cholecystectomy 10/07/2012, ERCP 10/08/2012, abdominal pain/nausea, evaluate for residual stone  MRI ABDOMEN WITHOUT CONTRAST (MRCP)  Technique: Multiplanar multisequence MR imaging of the abdomen was performed, including heavily T2-weighted images of the  biliary and pancreatic ducts.  Three-dimensional MR images were rendered by post processing of the original MR data.  Comparison:  Ultrasound abdomen dated 09/12/2012.  Intraoperative cholangiogram dated 10/07/2012.  Findings:  Motion degraded images.  Cardiomegaly.  Trace left pleural effusion.  Unenhanced liver, spleen, pancreas, and left adrenal gland are within normal limits.  2.6 x 2.0 cm right adrenal nodule with signal loss on opposed phase imaging, reflecting an adrenal adenoma.  Status post cholecystectomy with postsurgical fluid in the surgical bed (series 3/image 28).  No drainable fluid collection/abscess.  Common duct is mildly dilated, measuring 9 mm (series 4/image 51). Three filling defects are present within the distal common duct (series 10/image 1), suspicious for common duct stones, measuring up to 9 mm (series 4/image 48).  Additional fluid/stranding along medial right renal fossa and second/third portion of the duodenum (series 3/image 34), likely postsurgical.  No suspicious abdominal lymphadenopathy.  No focal osseous lesions.  IMPRESSION: Status post cholecystectomy with postsurgical changes/fluid.  No drainable fluid collection/abscess.  Mildly dilated common duct, measuring 9 mm.  Suspected choledocholithiasis with three filling defects in the distal common duct measuring up to 9 mm.   Original Report Authenticated By: Charline Bills, M.D.    Discharge Exam:  Filed Vitals:   10/09/12 1034  BP: 102/56  Pulse: 60  Temp: 97.7 F (36.5 C)  Resp: 16    General: WN obese older WF who is alert.  Lungs: Clear to auscultation and symmetric breath sounds. Heart:  RRR. No murmur or rub. Abdomen: Soft. No mass. No tenderness. No hernia. Normal bowel sounds.  The incisions look good.  Discharge Medications:     Medication List         aspirin EC 81 MG tablet  Take 81 mg by mouth daily.     atorvastatin 80 MG tablet  Commonly known as:  LIPITOR  Take 80 mg by mouth every  morning.     carvedilol 25 MG tablet  Commonly known as:  COREG  Take 1 tablet (25 mg total) by mouth 2 (two) times daily with a meal.     clopidogrel 75 MG tablet  Commonly known as:  PLAVIX  Take 1 tablet (75 mg total) by mouth daily.     FLUoxetine 10 MG capsule  Commonly known as:  PROZAC  Take 10 mg  by mouth every morning.     furosemide 40 MG tablet  Commonly known as:  LASIX  Take 40 mg by mouth every morning.     lisinopril 10 MG tablet  Commonly known as:  PRINIVIL,ZESTRIL  Take 10 mg by mouth every morning.     nitroGLYCERIN 0.4 MG SL tablet  Commonly known as:  NITROSTAT  Place 0.4 mg under the tongue every 5 (five) minutes as needed for chest pain.     pantoprazole 40 MG tablet  Commonly known as:  PROTONIX  Take 40 mg by mouth daily.        Disposition: 01-Home or Self Care      Discharge Orders   Future Appointments Provider Department Dept Phone   10/29/2012 10:00 AM Kandis Cocking, MD [facility], [place] [phone]   Future Orders Complete By Expires   Diet - low sodium heart healthy  As directed    Increase activity slowly  As directed       Activity:  Driving - N/A   Lifting - No lifting > 15 pounds for about a week, then no limit  Wound Care:   May shower.  The glue over the incisions will come off over 1 to 2 weeks.  Diet:  As tolerated.  Follow up appointment:  Call Dr. Allene Pyo office [facility]) at [phone] for an appointment in 2 to 3 weeks.  Dr. Marvell Fuller office should contact you in 3 to 6 days.  Dr. Marvell Fuller office is [phone].  Medications and dosages:  Resume your home medications.  You may restart the Plavix.  You may take tyelenol or advil for pain.  Signed: Ovidio Kin, M.D., FACS  10/09/2012, 1:08 PM

## 2012-10-09 NOTE — Progress Notes (Addendum)
General Surgery Note  LOS: 2 days  POD -  1 Day Post-Op  Assessment/Plan: 1.  LAPAROSCOPIC CHOLECYSTECTOMY WITH INTRAOPERATIVE CHOLANGIOGRAM, lymph node biopsy - 10/07/2012 - D. Amaree Leeper  Doing well from surgery.  Plan to discharge today.  The patient will see me in about 2 weeks.  Dr. Marvell Fuller office will contact her from their standpoint.  2.  Choledocholithiasis  LFT's - normal - 10/09/2012  ERCP by Dr. Leone Payor - 10/08/2012 - unable to clear CB duct  MRCP - 10/09/2012 - shows 3 CBD stones. Discussed with Willette Cluster. Dr. Leone Payor wants to review the case with partners who do ERCP and will get in touch with the patient.  She can be discharged and restarted on Plavix. [I spoke to son, Aminta Sakurai, about findings.  Cell:  [phone]]  3.  Short term memory problems 4.  CAD with history of MI   History of coronary stents   Sees Dr. Becky Augusta  5.  History of heart failure  6.  HTN  7.  Ischemic cardiomyopathy.  8.  GERD  9.  On Plavix - held 10. Morbid obesity - BMI - 39 11. DVT prophylaxis - SQ Heparin  Subjective:  Patient at MRCP.  Patient lives with sister, Steward Drone, and uncle in [place].  Brett Canales, the son, are looking for another option.  Objective:   Filed Vitals:   10/09/12 0526  BP: 128/52  Pulse: 67  Temp: 98.3 F (36.8 C)  Resp: 18     Intake/Output from previous day:  08/13 0701 - 08/14 0700 In: 2900 [I.V.:2900] Out: 1750 [Urine:1750]  Intake/Output this shift:      Physical Exam:   Pending   Lab Results:   No results found for this basename: WBC, HGB, HCT, PLT,  in the last 72 hours  BMET    Recent Labs  10/08/12 0430 10/09/12 0434  NA 135 136  K 4.2 4.1  CL 101 102  CO2 25 30  GLUCOSE 150* 107*  BUN 14 13  CREATININE 1.13* 1.35*  CALCIUM 9.2 9.1    PT/INR  No results found for this basename: LABPROT, INR,  in the last 72 hours  ABG  No results found for this basename: PHART, PCO2, PO2, HCO3,  in the last 72  hours   Studies/Results:  Dg Cholangiogram Operative  10/07/2012   *RADIOLOGY REPORT*  Clinical Data: Cholecystectomy  INTRAOPERATIVE CHOLANGIOGRAM  Technique:  Multiple fluoroscopic spot radiographs were obtained during intraoperative cholangiogram and are submitted for interpretation post-operatively.  Comparison: None.  Findings:  At least one large   nonocclusive mobile filling defect in the common bile duct just proximal to the ampulla.  Intrahepatic ducts are incompletely visualized, appearing decompressed centrally. Contrast passes into the duodenum.  IMPRESSION  Filling defect in the distal common bile duct without obstruction, suggesting retained stone versus blood clot or air bubble. Consider ERCP for further assessment.   Original Report Authenticated By: D. Andria Rhein, MD     Anti-infectives:   Anti-infectives   Start     Dose/Rate Route Frequency Ordered Stop   10/08/12 1500  ampicillin-sulbactam (UNASYN) 1.5 g in sodium chloride 0.9 % 50 mL IVPB     1.5 g 100 mL/hr over 30 Minutes Intravenous  Once 10/08/12 1448 10/08/12 1526   10/08/12 1330  ampicillin-sulbactam (UNASYN) 1.5 g in sodium chloride 0.9 % 50 mL IVPB  Status:  Discontinued     1.5 g 100 mL/hr over 30 Minutes Intravenous  Once 10/08/12  1191 10/08/12 1525   10/07/12 0934  ceFAZolin (ANCEF) IVPB 2 g/50 mL premix     2 g 100 mL/hr over 30 Minutes Intravenous On call to O.R. 10/07/12 0934 10/07/12 1242      Ovidio Kin, MD, FACS Pager: [phone],   [facility] Office: [phone] 10/09/2012

## 2012-10-13 NOTE — Progress Notes (Signed)
Quick Note:  Please let patient know biopsies did not show any polyp or cancer - its ok.  I failed to gain access to her bile duct and think we need to send her to [facility] or [facility] to get stones out of bile duct. May need to discuss with son or daughter.  Please let me know and I can call them after you call, sheri. I did explain this after the ERCP last week. Problem is difficult ampullary anatomy and diverticulum. Dr. Russella Dar reviewed and agrees w/ tertiary referral. She takes Plavix FYI   ______

## 2012-10-16 ENCOUNTER — Other Ambulatory Visit (HOSPITAL_COMMUNITY): Payer: Self-pay | Admitting: Internal Medicine

## 2012-10-16 DIAGNOSIS — K8020 Calculus of gallbladder without cholecystitis without obstruction: Secondary | ICD-10-CM

## 2012-10-29 ENCOUNTER — Ambulatory Visit (INDEPENDENT_AMBULATORY_CARE_PROVIDER_SITE_OTHER): Payer: Medicare Other | Admitting: Surgery

## 2012-10-29 ENCOUNTER — Encounter (INDEPENDENT_AMBULATORY_CARE_PROVIDER_SITE_OTHER): Payer: Self-pay | Admitting: Surgery

## 2012-10-29 VITALS — BP 132/76 | HR 60 | Temp 97.8°F | Resp 14 | Ht 65.0 in | Wt 221.2 lb

## 2012-10-29 DIAGNOSIS — K8050 Calculus of bile duct without cholangitis or cholecystitis without obstruction: Secondary | ICD-10-CM

## 2012-10-29 DIAGNOSIS — R1013 Epigastric pain: Secondary | ICD-10-CM

## 2012-10-29 NOTE — Progress Notes (Addendum)
Re:   Kaitlyn Simmons DOB:   1946-02-07 MRN:   [id number]  She has "break the glass" on Epic ???  ASSESSMENT AND PLAN: 1.  Gallstones  Cholecystectomy with IOC - 10/07/2012.  She has done well from this and will see me back PRN.   A.  She had CBD stones.  Dr. Leone Payor tried to do and ERCP, but was unsuccessful.  Dr. Leone Payor is planning consult at [facility] or [facility] for follow up of the CBD stone - I reinforced to the patient about following up on this and I wrote it on the path report. [Dr. Leone Payor sent me a note that she had a successful stone extraction at [facility].    DN 9/28/21014] 2.  CAD with history of MI  History of coronary stents  Sees Dr. Becky Augusta   3. Short term memory problems  4.  History of heart failure 5.  HTN 6.  Ischemic cardiomyopathy. 7.  GERD 8.  On Plavix 9.  Morbid obesity - BMI - 39  Chief Complaint  Patient presents with  . Routine Post Op    p/o chole   REFERRING PHYSICIAN: Park Pope, MD  HISTORY OF PRESENT ILLNESS: Kaitlyn VERDONE is a 67 y.o. (DOB: 1945/04/11)  white  female whose primary care physician is Park Pope, MD and comes to me today for follow up of cholecystectomy.  She is dong well and has no complaints.  Her sister is in the car with her dog.  I reviewed the findings with the patient.  History of gall bladder disease: Ms. Ehrler was recently hospitalized at [facility] (09/11/2012 - 09/13/2012)  for epigastric pain which was initially evaluated for cardiac disease.  She actually first presented to [facility] and was transferred to [facility].  At first she told me, "I was in the hospital, but I was not sure why I was there."   She was noted to have elevated LFT's.  Alk Phos - 222 and T. Bili - 1.1 on 09/12/2012. Her hepatitis panel was negative.   An [place] on 09/12/2012 showed Cholelithiasis, largest stone 1.8 cm.  She was discharged home with follow up with general surgery.  Her sister had gall bladder disease. She is unsure if she has had a colonoscopy.   She has had no prior abdominal surgery.   Past Medical History  Diagnosis Date  . CAD (coronary artery disease)     a. s/p IL STEMI 7/11 => tx with BMS to RCA (tx at [facility]);  b.  AL STEMI 7/12 => LHC: mRCA 100% ==> PCI with BMS to RCA; LM 10%, pCFX 50%, OM1 10%, pLAD 50%, mLAD 99%, 70%  ==>  c. staged PCI: BMS to  LAD (tx at [facility])  . Chronic systolic heart failure   . HTN (hypertension)   . Hyperlipidemia   . Ischemic cardiomyopathy     a. EF 30-35% at time of AL STEMI 7/12;   b. IMPROVED by f/u Echo 9/13:  mild LVH, EF 45-50%, mid to dist Inf, AS, Apical HK, Gr 1 diast dysfn, MAC, mod LAE  . Cataracts, bilateral   . CHF (congestive heart failure)   . GERD (gastroesophageal reflux disease)   . STEMI (ST elevation myocardial infarction) 2011; 2012    [place]; [place]  . Poor short-term memory   . Dementia      Current Outpatient Prescriptions  Medication Sig Dispense Refill  . aspirin EC 81 MG tablet Take 81 mg by mouth daily.      Marland Kitchen  atorvastatin (LIPITOR) 80 MG tablet Take 80 mg by mouth every morning.       . carvedilol (COREG) 25 MG tablet Take 1 tablet (25 mg total) by mouth 2 (two) times daily with a meal.  60 tablet  5  . clopidogrel (PLAVIX) 75 MG tablet Take 1 tablet (75 mg total) by mouth daily.  90 tablet  3  . FLUoxetine (PROZAC) 10 MG capsule Take 10 mg by mouth every morning.       . furosemide (LASIX) 40 MG tablet Take 40 mg by mouth every morning.      Marland Kitchen lisinopril (PRINIVIL,ZESTRIL) 10 MG tablet Take 10 mg by mouth every morning.      . nitroGLYCERIN (NITROSTAT) 0.4 MG SL tablet Place 0.4 mg under the tongue every 5 (five) minutes as needed for chest pain.      . pantoprazole (PROTONIX) 40 MG tablet Take 40 mg by mouth daily.         No current facility-administered medications for this visit.     No Known Allergies  REVIEW OF SYSTEMS: Neurologic: Short term memory problems  Cardiac:  History of CAD.  History of cardiac stent.  Sees Dr. Melburn Popper.  Hypertension.  Ischemic  cardiomyopathy.  Echo - 11/23/2011 - 45% EF. Pulmonary:  Quit smoking 2008. Gastrointestinal:  See HPI.  Hematologic:  On Plavix.  SOCIAL and FAMILY HISTORY: Divorced. Lives with sister, Kaitlyn Simmons, and [address] She is from [place], [place]. Has 2 children, daughter 75 who lives in [place], and son 29 who lives in [place]. Retired in 2008.  She worked as a Production designer, theatre/television/film of a radio station.  PHYSICAL EXAM: BP 132/76  Pulse 60  Temp(Src) 97.8 F (36.6 C) (Temporal)  Resp 14  Ht 5\' 5"  (1.651 m)  Wt 221 lb 3.2 oz (100.336 kg)  BMI 36.81 kg/m2  General: Obese WF who is alert and generally healthy appearing.  She is a little disconnected on her history. HEENT: Normal. Pupils equal. Abdomen: Soft. No mass. No tenderness. No hernia. Normal bowel sounds.  Abdominal incisions look good.  DATA REVIEWED: Path report to patient.  Ovidio Kin, MD,  [facility] [address] [place]  [address]   [place], [place]    [address] Phone:  [phone] FAX:  [phone]

## 2012-11-10 HISTORY — PX: ERCP W/ SPHICTEROTOMY: SHX1523

## 2012-11-23 ENCOUNTER — Encounter: Payer: Self-pay | Admitting: Internal Medicine

## 2013-01-01 ENCOUNTER — Other Ambulatory Visit: Payer: Self-pay

## 2014-08-05 ENCOUNTER — Other Ambulatory Visit: Payer: Self-pay | Admitting: Family Medicine

## 2014-08-05 MED ORDER — FLUOXETINE HCL 10 MG PO CAPS
20.0000 mg | ORAL_CAPSULE | Freq: Every morning | ORAL | Status: DC
Start: 2014-08-05 — End: 2014-09-17

## 2014-08-23 ENCOUNTER — Other Ambulatory Visit: Payer: Self-pay

## 2014-09-17 ENCOUNTER — Encounter: Payer: Self-pay | Admitting: Family Medicine

## 2014-09-17 ENCOUNTER — Ambulatory Visit (INDEPENDENT_AMBULATORY_CARE_PROVIDER_SITE_OTHER): Payer: Commercial Managed Care - HMO | Admitting: Family Medicine

## 2014-09-17 VITALS — BP 105/66 | HR 56 | Temp 98.2°F | Resp 16 | Ht 65.0 in | Wt 240.6 lb

## 2014-09-17 DIAGNOSIS — I5022 Chronic systolic (congestive) heart failure: Secondary | ICD-10-CM

## 2014-09-17 DIAGNOSIS — I2510 Atherosclerotic heart disease of native coronary artery without angina pectoris: Secondary | ICD-10-CM

## 2014-09-17 DIAGNOSIS — E785 Hyperlipidemia, unspecified: Secondary | ICD-10-CM

## 2014-09-17 DIAGNOSIS — F329 Major depressive disorder, single episode, unspecified: Secondary | ICD-10-CM

## 2014-09-17 DIAGNOSIS — I10 Essential (primary) hypertension: Secondary | ICD-10-CM | POA: Diagnosis not present

## 2014-09-17 DIAGNOSIS — F32A Depression, unspecified: Secondary | ICD-10-CM | POA: Insufficient documentation

## 2014-09-17 DIAGNOSIS — I2129 ST elevation (STEMI) myocardial infarction involving other sites: Secondary | ICD-10-CM | POA: Diagnosis not present

## 2014-09-17 MED ORDER — FUROSEMIDE 40 MG PO TABS: 40.0000 mg | ORAL_TABLET | Freq: Every morning | ORAL | Status: DC

## 2014-09-17 MED ORDER — CARVEDILOL 25 MG PO TABS: 25.0000 mg | ORAL_TABLET | Freq: Two times a day (BID) | ORAL | Status: DC

## 2014-09-17 MED ORDER — CLOPIDOGREL BISULFATE 75 MG PO TABS: 75.0000 mg | ORAL_TABLET | Freq: Every day | ORAL | Status: DC

## 2014-09-17 MED ORDER — ATORVASTATIN CALCIUM 80 MG PO TABS: 80.0000 mg | ORAL_TABLET | Freq: Every morning | ORAL | Status: DC

## 2014-09-17 MED ORDER — FLUOXETINE HCL 10 MG PO CAPS: 20.0000 mg | ORAL_CAPSULE | Freq: Every morning | ORAL | Status: DC

## 2014-09-17 NOTE — Patient Instructions (Signed)
Patient needs to have appt. With Dr Katharina Caper.  Family will arrange.

## 2014-09-17 NOTE — Progress Notes (Signed)
Name: Kaitlyn Simmons   MRN: [id number]    DOB: 09-Dec-1945   Date:09/17/2014       Progress Note  Subjective  Chief Complaint  Chief Complaint  Patient presents with  . Hypertension    HPI  For \\f /u of HBP. CAD, CHF, depression.  Has not kept good f/u.  Has not seen her Card. (Dr. Katharina Caper) in quite some time.  She does not take meds regularly according to daughter.   Past Medical History  Diagnosis Date  . CAD (coronary artery disease)     a. s/p IL STEMI 7/11 => tx with BMS to RCA (tx at [facility]);  b.  AL STEMI 7/12 => LHC: mRCA 100% ==> PCI with BMS to RCA; LM 10%, pCFX 50%, OM1 10%, pLAD 50%, mLAD 99%, 70%  ==>  c. staged PCI: BMS to  LAD (tx at [facility])  . Chronic systolic heart failure   . HTN (hypertension)   . Hyperlipidemia   . Ischemic cardiomyopathy     a. EF 30-35% at time of AL STEMI 7/12;   b. IMPROVED by f/u Echo 9/13:  mild LVH, EF 45-50%, mid to dist Inf, AS, Apical HK, Gr 1 diast dysfn, MAC, mod LAE  . Cataracts, bilateral   . CHF (congestive heart failure)   . GERD (gastroesophageal reflux disease)   . STEMI (ST elevation myocardial infarction) 2011; 2012    [place]; [place]  . Poor short-term memory   . Dementia     Past Surgical History  Procedure Laterality Date  . Tonsillectomy and adenoidectomy  1980's  . Coronary angioplasty with stent placement  2011; 2012    RCA; RCA & LAD  . Dilation and curettage of uterus  1970's  . Cataract extraction w/ intraocular lens implant Left ~ 2008  . Cholecystectomy N/A 10/07/2012    Procedure: LAPAROSCOPIC CHOLECYSTECTOMY WITH INTRAOPERATIVE CHOLANGIOGRAM;  Surgeon: Shann Medal, MD;  Location: [facility];  Service: General;  Laterality: N/A;  . Ercp N/A 10/08/2012    Procedure: ENDOSCOPIC RETROGRADE CHOLANGIOPANCREATOGRAPHY (ERCP);  Surgeon: Gatha Mayer, MD;  Location: [facility];  Service: Endoscopy;  Laterality: N/A;  . Ercp w/ sphicterotomy  11/10/2012    stones removed [place]    Family History  Problem Relation Age of  Onset  . Heart attack Father     DECEASED  . Stroke Father     DECEASED  . Leukemia Mother     DECEASED  . Heart attack Sister     ALIVE  . Hypertension Sister     History   Social History  . Marital Status: Single    Spouse Name: N/A  . Number of Children: N/A  . Years of Education: N/A   Occupational History  . Not on file.   Social History Main Topics  . Smoking status: Former Smoker -- 2.00 packs/day for 30 years    Types: Cigarettes    Quit date: 10/25/2002  . Smokeless tobacco: Never Used  . Alcohol Use: No  . Drug Use: No  . Sexual Activity: No   Other Topics Concern  . Not on file   Social History Narrative     Current outpatient prescriptions:  .  aspirin EC 81 MG tablet, Take 81 mg by mouth daily., Disp: , Rfl:  .  atorvastatin (LIPITOR) 80 MG tablet, Take 80 mg by mouth every morning. , Disp: , Rfl:  .  carvedilol (COREG) 25 MG tablet, Take 25 mg by mouth 2 (two) times daily  with a meal., Disp: , Rfl:  .  clopidogrel (PLAVIX) 75 MG tablet, Take 1 tablet (75 mg total) by mouth daily., Disp: 90 tablet, Rfl: 3 .  FLUoxetine (PROZAC) 10 MG capsule, Take 2 capsules (20 mg total) by mouth every morning., Disp: 30 capsule, Rfl: 1 .  furosemide (LASIX) 40 MG tablet, Take 40 mg by mouth every morning., Disp: , Rfl:  .  nitroGLYCERIN (NITROSTAT) 0.4 MG SL tablet, Place 0.4 mg under the tongue every 5 (five) minutes as needed for chest pain., Disp: , Rfl:   No Known Allergies   Review of Systems  Constitutional: Negative for fever, chills and malaise/fatigue.  HENT: Negative for nosebleeds.   Eyes: Negative for blurred vision and double vision.  Respiratory: Negative for cough, sputum production, shortness of breath and wheezing.   Cardiovascular: Negative for chest pain, palpitations, orthopnea and leg swelling.  Gastrointestinal: Negative for heartburn, abdominal pain and blood in stool.  Genitourinary: Negative for dysuria, urgency and frequency.  Skin:  Negative for rash.  Neurological: Negative for weakness and headaches.  Psychiatric/Behavioral: Positive for depression.      Objective  Filed Vitals:   09/17/14 1045  BP: 105/66  Pulse: 56  Temp: 98.2 F (36.8 C)  Resp: 16  Height: 5\' 5"  (1.651 m)  Weight: 240 lb 9.6 oz (109.135 kg)    Physical Exam  Constitutional: She is well-developed, well-nourished, and in no distress.  HENT:  Head: Normocephalic and atraumatic.  Eyes: Conjunctivae and EOM are normal. Pupils are equal, round, and reactive to light. No scleral icterus.  Neck: Normal range of motion. Neck supple. No thyromegaly present.  Cardiovascular: Normal rate, regular rhythm, normal heart sounds and intact distal pulses.  Exam reveals no gallop and no friction rub.   No murmur heard. Pulmonary/Chest: Effort normal and breath sounds normal. No respiratory distress. She has no wheezes. She has no rales.  Abdominal: Soft. Bowel sounds are normal. She exhibits no mass. There is no tenderness.  Musculoskeletal: She exhibits edema (trace edema bilateral feet.).  Lymphadenopathy:    She has no cervical adenopathy.  Neurological: She is alert.  forgetful  Skin: Skin is warm and dry.  Psychiatric: Mood and affect normal.  Vitals reviewed.        Assessment & Plan  Problem List Items Addressed This Visit      Cardiovascular and Mediastinum   Myocardial infarction   Relevant Medications   carvedilol (COREG) 25 MG tablet   CHF (congestive heart failure)   Relevant Medications   carvedilol (COREG) 25 MG tablet   HTN (hypertension)   Relevant Medications   carvedilol (COREG) 25 MG tablet   CAD (coronary artery disease) - Primary   Relevant Medications   carvedilol (COREG) 25 MG tablet     Other   Hyperlipidemia   Relevant Medications   carvedilol (COREG) 25 MG tablet      Meds ordered this encounter  Medications  . carvedilol (COREG) 25 MG tablet    Sig: Take 25 mg by mouth 2 (two) times daily with  a meal.   1. Coronary artery disease involving native coronary artery of native heart without angina pectoris  - atorvastatin (LIPITOR) 80 MG tablet; Take 1 tablet (80 mg total) by mouth every morning.  Dispense: 30 tablet; Refill: 12 - carvedilol (COREG) 25 MG tablet; Take 1 tablet (25 mg total) by mouth 2 (two) times daily with a meal.  Dispense: 30 tablet; Refill: 12 - clopidogrel (PLAVIX) 75 MG tablet;  Take 1 tablet (75 mg total) by mouth daily.  Dispense: 30 tablet; Refill: 12  2. Essential hypertension  - Comprehensive Metabolic Panel (CMET)  3. Chronic systolic congestive heart failure  - CBC with Differential - furosemide (LASIX) 40 MG tablet; Take 1 tablet (40 mg total) by mouth every morning.  Dispense: 30 tablet; Refill: 12  4. Hyperlipidemia  - Lipid Profile  5. Myocardial infarction involving other coronary artery   6. Depression  - FLUoxetine (PROZAC) 10 MG capsule; Take 2 capsules (20 mg total) by mouth every morning.  Dispense: 30 capsule; Refill: 12

## 2014-10-04 LAB — CBC WITH DIFFERENTIAL/PLATELET
Basophils Absolute: 0.1 10*3/uL (ref 0.0–0.2)
Basos: 1 %
EOS (ABSOLUTE): 0.3 10*3/uL (ref 0.0–0.4)
EOS: 4 %
HEMOGLOBIN: 12.6 g/dL (ref 11.1–15.9)
Hematocrit: 37.2 % (ref 34.0–46.6)
IMMATURE GRANULOCYTES: 0 %
Immature Grans (Abs): 0 10*3/uL (ref 0.0–0.1)
LYMPHS: 28 %
Lymphocytes Absolute: 2.1 10*3/uL (ref 0.7–3.1)
MCH: 29.4 pg (ref 26.6–33.0)
MCHC: 33.9 g/dL (ref 31.5–35.7)
MCV: 87 fL (ref 79–97)
MONOCYTES: 10 %
MONOS ABS: 0.8 10*3/uL (ref 0.1–0.9)
NEUTROS PCT: 57 %
Neutrophils Absolute: 4.3 10*3/uL (ref 1.4–7.0)
Platelets: 227 10*3/uL (ref 150–379)
RBC: 4.28 x10E6/uL (ref 3.77–5.28)
RDW: 14.5 % (ref 12.3–15.4)
WBC: 7.6 10*3/uL (ref 3.4–10.8)

## 2014-10-05 ENCOUNTER — Telehealth: Payer: Self-pay | Admitting: Family Medicine

## 2014-10-05 ENCOUNTER — Other Ambulatory Visit: Payer: Self-pay | Admitting: Family Medicine

## 2014-10-05 DIAGNOSIS — I2510 Atherosclerotic heart disease of native coronary artery without angina pectoris: Secondary | ICD-10-CM

## 2014-10-05 LAB — COMPREHENSIVE METABOLIC PANEL
ALK PHOS: 72 IU/L (ref 39–117)
ALT: 11 IU/L (ref 0–32)
AST: 17 IU/L (ref 0–40)
Albumin/Globulin Ratio: 1.4 (ref 1.1–2.5)
Albumin: 3.9 g/dL (ref 3.5–4.8)
BUN/Creatinine Ratio: 13 (ref 11–26)
BUN: 16 mg/dL (ref 8–27)
Bilirubin Total: 0.6 mg/dL (ref 0.0–1.2)
CALCIUM: 9 mg/dL (ref 8.7–10.3)
CHLORIDE: 104 mmol/L (ref 97–108)
CO2: 22 mmol/L (ref 18–29)
CREATININE: 1.25 mg/dL — AB (ref 0.57–1.00)
GFR calc Af Amer: 50 mL/min/{1.73_m2} — ABNORMAL LOW (ref >59)
GFR, EST NON AFRICAN AMERICAN: 44 mL/min/{1.73_m2} — AB (ref >59)
GLOBULIN, TOTAL: 2.7 g/dL (ref 1.5–4.5)
Glucose: 94 mg/dL (ref 65–99)
POTASSIUM: 4.5 mmol/L (ref 3.5–5.2)
SODIUM: 141 mmol/L (ref 134–144)
Total Protein: 6.6 g/dL (ref 6.0–8.5)

## 2014-10-05 LAB — LIPID PANEL
CHOL/HDL RATIO: 3.7 ratio (ref 0.0–4.4)
CHOLESTEROL TOTAL: 129 mg/dL (ref 100–199)
HDL: 35 mg/dL — AB (ref >39)
LDL CALC: 73 mg/dL (ref 0–99)
Triglycerides: 103 mg/dL (ref 0–149)
VLDL Cholesterol Cal: 21 mg/dL (ref 5–40)

## 2014-10-05 MED ORDER — CARVEDILOL 25 MG PO TABS: 25.0000 mg | ORAL_TABLET | Freq: Two times a day (BID) | ORAL | Status: DC

## 2014-10-05 NOTE — Telephone Encounter (Signed)
Pt  Daughter called (karen) requesting  Lab results. Santiago Glad call back # is  [phone]

## 2014-10-05 NOTE — Telephone Encounter (Signed)
Spoke with patient's daughter Santiago Glad and relayed lab results. Nothing further needed.

## 2014-12-13 ENCOUNTER — Other Ambulatory Visit: Payer: Self-pay | Admitting: Family Medicine

## 2014-12-17 ENCOUNTER — Encounter: Payer: Self-pay | Admitting: Family Medicine

## 2014-12-17 ENCOUNTER — Ambulatory Visit (INDEPENDENT_AMBULATORY_CARE_PROVIDER_SITE_OTHER): Payer: Commercial Managed Care - HMO | Admitting: Family Medicine

## 2014-12-17 VITALS — BP 110/65 | HR 54 | Temp 97.5°F | Resp 16 | Ht 65.0 in | Wt 223.6 lb

## 2014-12-17 DIAGNOSIS — F329 Major depressive disorder, single episode, unspecified: Secondary | ICD-10-CM

## 2014-12-17 DIAGNOSIS — Z6841 Body Mass Index (BMI) 40.0 and over, adult: Secondary | ICD-10-CM | POA: Diagnosis not present

## 2014-12-17 DIAGNOSIS — Z23 Encounter for immunization: Secondary | ICD-10-CM | POA: Diagnosis not present

## 2014-12-17 DIAGNOSIS — I2510 Atherosclerotic heart disease of native coronary artery without angina pectoris: Secondary | ICD-10-CM

## 2014-12-17 DIAGNOSIS — I10 Essential (primary) hypertension: Secondary | ICD-10-CM | POA: Diagnosis not present

## 2014-12-17 DIAGNOSIS — N289 Disorder of kidney and ureter, unspecified: Secondary | ICD-10-CM | POA: Diagnosis not present

## 2014-12-17 DIAGNOSIS — E785 Hyperlipidemia, unspecified: Secondary | ICD-10-CM

## 2014-12-17 DIAGNOSIS — I5022 Chronic systolic (congestive) heart failure: Secondary | ICD-10-CM | POA: Diagnosis not present

## 2014-12-17 DIAGNOSIS — F32A Depression, unspecified: Secondary | ICD-10-CM

## 2014-12-17 MED ORDER — CARVEDILOL 25 MG PO TABS: ORAL_TABLET | ORAL | Status: DC

## 2014-12-17 MED ORDER — LISINOPRIL 2.5 MG PO TABS: 2.5000 mg | ORAL_TABLET | Freq: Every day | ORAL | Status: DC

## 2014-12-17 NOTE — Progress Notes (Signed)
Name: Kaitlyn Simmons   MRN: [id number]    DOB: 03/02/1945   Date:12/17/2014       Progress Note  Subjective  Chief Complaint  Chief Complaint  Patient presents with  . Coronary Artery Disease    3 month follow up    HPI Here for f/u of coronary artery disease, HBP, elevated lipids, depression and decreased mental function (dementia, mild).  Taking all meds.  Feels well overall.  No chest pain or SOB.  Has some renal insufficiency.  No problem-specific assessment & plan notes found for this encounter.   Past Medical History  Diagnosis Date  . CAD (coronary artery disease)     a. s/p IL STEMI 7/11 => tx with BMS to RCA (tx at [facility]);  b.  AL STEMI 7/12 => LHC: mRCA 100% ==> PCI with BMS to RCA; LM 10%, pCFX 50%, OM1 10%, pLAD 50%, mLAD 99%, 70%  ==>  c. staged PCI: BMS to  LAD (tx at [facility])  . Chronic systolic heart failure ([place])   . HTN (hypertension)   . Hyperlipidemia   . Ischemic cardiomyopathy     a. EF 30-35% at time of AL STEMI 7/12;   b. IMPROVED by f/u Echo 9/13:  mild LVH, EF 45-50%, mid to dist Inf, AS, Apical HK, Gr 1 diast dysfn, MAC, mod LAE  . Cataracts, bilateral   . CHF (congestive heart failure) ([place])   . GERD (gastroesophageal reflux disease)   . STEMI (ST elevation myocardial infarction) ([place]) 2011; 2012    [place]; [place]  . Poor short-term memory   . Dementia     Social History  Substance Use Topics  . Smoking status: Former Smoker -- 2.00 packs/day for 30 years    Types: Cigarettes    Quit date: 10/25/2002  . Smokeless tobacco: Never Used  . Alcohol Use: No     Current outpatient prescriptions:  .  aspirin EC 81 MG tablet, Take 81 mg by mouth daily., Disp: , Rfl:  .  atorvastatin (LIPITOR) 80 MG tablet, Take 1 tablet (80 mg total) by mouth every morning., Disp: 30 tablet, Rfl: 12 .  carvedilol (COREG) 25 MG tablet, TAKE 1 TABLET BY MOUTH TWICE DAILY. WITH A MEAL, Disp: 60 tablet, Rfl: 6 .  clopidogrel (PLAVIX) 75 MG tablet, Take 1 tablet (75 mg total) by  mouth daily., Disp: 30 tablet, Rfl: 12 .  FLUoxetine (PROZAC) 20 MG capsule, TAKE 1 CAPSULE BY MOUTH ONCE EVERY MORNING, Disp: 30 capsule, Rfl: 6 .  furosemide (LASIX) 40 MG tablet, Take 1 tablet (40 mg total) by mouth every morning., Disp: 30 tablet, Rfl: 12 .  nitroGLYCERIN (NITROSTAT) 0.4 MG SL tablet, Place 0.4 mg under the tongue every 5 (five) minutes as needed for chest pain., Disp: , Rfl:   No Known Allergies  Review of Systems  Constitutional: Negative for fever, chills, weight loss and malaise/fatigue.  HENT: Negative for hearing loss.   Eyes: Negative for blurred vision and double vision.  Respiratory: Negative for cough, shortness of breath and wheezing.   Cardiovascular: Negative for chest pain, palpitations, orthopnea and leg swelling.  Gastrointestinal: Positive for heartburn (rarely.). Negative for abdominal pain and blood in stool.  Genitourinary: Negative for dysuria, urgency and frequency.  Skin: Negative for rash.  Neurological: Negative for dizziness, tremors, weakness and headaches.  Psychiatric/Behavioral: Negative for depression (stable).      Objective  Filed Vitals:   12/17/14 0807  BP: 110/72  Pulse: 51  Temp: 97.5  F (36.4 C)  TempSrc: Oral  Resp: 16  Height: 5\' 5"  (1.651 m)  Weight: 223 lb 9.6 oz (101.424 kg)     Physical Exam  Constitutional: She is well-developed, well-nourished, and in no distress. No distress.  HENT:  Head: Normocephalic and atraumatic.  Eyes: Conjunctivae and EOM are normal. Pupils are equal, round, and reactive to light. No scleral icterus.  Neck: Normal range of motion. Neck supple. Carotid bruit is not present. No thyromegaly present.  Cardiovascular: Regular rhythm and intact distal pulses.  Bradycardia present.  Exam reveals no gallop and no friction rub.   Murmur heard.  Systolic murmur is present with a grade of 1/6  URSB  Pulmonary/Chest: Effort normal and breath sounds normal. No respiratory distress. She has  no wheezes.  Musculoskeletal: She exhibits no edema.  Lymphadenopathy:    She has no cervical adenopathy.  Neurological: She is alert. Gait normal.  Oriented to person and place but not to time      Recent Results (from the past 2160 hour(s))  Comprehensive Metabolic Panel (CMET)     Status: Abnormal   Collection Time: 10/04/14  9:22 AM  Result Value Ref Range   Glucose 94 65 - 99 mg/dL   BUN 16 8 - 27 mg/dL   Creatinine, Ser 1.25 (H) 0.57 - 1.00 mg/dL   GFR calc non Af Amer 44 (L) >59 mL/min/1.73   GFR calc Af Amer 50 (L) >59 mL/min/1.73   BUN/Creatinine Ratio 13 11 - 26   Sodium 141 134 - 144 mmol/L   Potassium 4.5 3.5 - 5.2 mmol/L   Chloride 104 97 - 108 mmol/L   CO2 22 18 - 29 mmol/L   Calcium 9.0 8.7 - 10.3 mg/dL   Total Protein 6.6 6.0 - 8.5 g/dL   Albumin 3.9 3.5 - 4.8 g/dL   Globulin, Total 2.7 1.5 - 4.5 g/dL   Albumin/Globulin Ratio 1.4 1.1 - 2.5   Bilirubin Total 0.6 0.0 - 1.2 mg/dL   Alkaline Phosphatase 72 39 - 117 IU/L   AST 17 0 - 40 IU/L   ALT 11 0 - 32 IU/L  Lipid Profile     Status: Abnormal   Collection Time: 10/04/14  9:22 AM  Result Value Ref Range   Cholesterol, Total 129 100 - 199 mg/dL   Triglycerides 103 0 - 149 mg/dL   HDL 35 (L) >39 mg/dL    Comment: According to ATP-III Guidelines, HDL-C >59 mg/dL is considered a negative risk factor for CHD.    VLDL Cholesterol Cal 21 5 - 40 mg/dL   LDL Calculated 73 0 - 99 mg/dL   Chol/HDL Ratio 3.7 0.0 - 4.4 ratio units    Comment:                                   T. Chol/HDL Ratio                                             Men  Women                               1/2 Avg.Risk  3.4    3.3  Avg.Risk  5.0    4.4                                2X Avg.Risk  9.6    7.1                                3X Avg.Risk 23.4   11.0   CBC with Differential     Status: None   Collection Time: 10/04/14  9:22 AM  Result Value Ref Range   WBC 7.6 3.4 - 10.8 x10E3/uL   RBC 4.28 3.77  - 5.28 x10E6/uL   Hemoglobin 12.6 11.1 - 15.9 g/dL   Hematocrit 37.2 34.0 - 46.6 %   MCV 87 79 - 97 fL   MCH 29.4 26.6 - 33.0 pg   MCHC 33.9 31.5 - 35.7 g/dL   RDW 14.5 12.3 - 15.4 %   Platelets 227 150 - 379 x10E3/uL   Neutrophils 57 %   Lymphs 28 %   Monocytes 10 %   Eos 4 %   Basos 1 %   Neutrophils Absolute 4.3 1.4 - 7.0 x10E3/uL   Lymphocytes Absolute 2.1 0.7 - 3.1 x10E3/uL   Monocytes Absolute 0.8 0.1 - 0.9 x10E3/uL   EOS (ABSOLUTE) 0.3 0.0 - 0.4 x10E3/uL   Basophils Absolute 0.1 0.0 - 0.2 x10E3/uL   Immature Granulocytes 0 %   Immature Grans (Abs) 0.0 0.0 - 0.1 x10E3/uL     Assessment & Plan 1. Coronary artery disease involving native coronary artery of native heart without angina pectoris  - carvedilol (COREG) 25 MG tablet; Take 1/2 tablet twice a day.  Dispense: 60 tablet; Refill: 6  2. Essential hypertension  - lisinopril (ZESTRIL) 2.5 MG tablet; Take 1 tablet (2.5 mg total) by mouth daily.  Dispense: 30 tablet; Refill: 6  3. Chronic systolic congestive heart failure ([place])   4. Depression   5. Hyperlipidemia   6. Need for influenza vaccination  - Flu vaccine HIGH DOSE PF (Fluzone High dose)  7. Renal insufficiency

## 2014-12-17 NOTE — Patient Instructions (Signed)
Cont. Other meds at present doses except here changed.  Take 1/2 Carvedilol (25 mg)  twice a day .

## 2015-03-29 ENCOUNTER — Ambulatory Visit (INDEPENDENT_AMBULATORY_CARE_PROVIDER_SITE_OTHER): Payer: Medicare HMO | Admitting: Family Medicine

## 2015-03-29 ENCOUNTER — Encounter: Payer: Self-pay | Admitting: Family Medicine

## 2015-03-29 VITALS — BP 122/80 | HR 66 | Temp 97.6°F | Resp 16 | Ht 65.0 in | Wt 208.0 lb

## 2015-03-29 DIAGNOSIS — F329 Major depressive disorder, single episode, unspecified: Secondary | ICD-10-CM | POA: Diagnosis not present

## 2015-03-29 DIAGNOSIS — I10 Essential (primary) hypertension: Secondary | ICD-10-CM | POA: Diagnosis not present

## 2015-03-29 DIAGNOSIS — I2510 Atherosclerotic heart disease of native coronary artery without angina pectoris: Secondary | ICD-10-CM

## 2015-03-29 DIAGNOSIS — F0391 Unspecified dementia with behavioral disturbance: Secondary | ICD-10-CM | POA: Diagnosis not present

## 2015-03-29 DIAGNOSIS — F32A Depression, unspecified: Secondary | ICD-10-CM

## 2015-03-29 DIAGNOSIS — E785 Hyperlipidemia, unspecified: Secondary | ICD-10-CM | POA: Diagnosis not present

## 2015-03-29 DIAGNOSIS — R69 Illness, unspecified: Secondary | ICD-10-CM | POA: Diagnosis not present

## 2015-03-29 NOTE — Progress Notes (Signed)
Name: Kaitlyn Simmons   MRN: [id number]    DOB: Sep 08, 1945   Date:03/29/2015       Progress Note  Subjective  Chief Complaint  Chief Complaint  Patient presents with  . Coronary Artery Disease  . Irregular Heart Beat    HPI Here for f/u of CAD.  She has not seen Card in quite some time.  Needs to see Card. ([facility]).  Also family trying to arrange placement in memory care unit.  She is not capable of caring for her own needs, esp. Financial since her brain hypoxia sec. To her old MI in 2011.Marland Kitchen  No Chest Pain or SOB.  Wanders a lot.  Has some rash/dry skin with redness from being outside in woods.  No problem-specific assessment & plan notes found for this encounter.   Past Medical History  Diagnosis Date  . CAD (coronary artery disease)     a. s/p IL STEMI 7/11 => tx with BMS to RCA (tx at [facility]);  b.  AL STEMI 7/12 => LHC: mRCA 100% ==> PCI with BMS to RCA; LM 10%, pCFX 50%, OM1 10%, pLAD 50%, mLAD 99%, 70%  ==>  c. staged PCI: BMS to  LAD (tx at [facility])  . Chronic systolic heart failure ([place])   . HTN (hypertension)   . Hyperlipidemia   . Ischemic cardiomyopathy     a. EF 30-35% at time of AL STEMI 7/12;   b. IMPROVED by f/u Echo 9/13:  mild LVH, EF 45-50%, mid to dist Inf, AS, Apical HK, Gr 1 diast dysfn, MAC, mod LAE  . Cataracts, bilateral   . CHF (congestive heart failure) ([place])   . GERD (gastroesophageal reflux disease)   . STEMI (ST elevation myocardial infarction) ([place]) 2011; 2012    [place]; [place]  . Poor short-term memory   . Dementia     Past Surgical History  Procedure Laterality Date  . Tonsillectomy and adenoidectomy  1980's  . Coronary angioplasty with stent placement  2011; 2012    RCA; RCA & LAD  . Dilation and curettage of uterus  1970's  . Cataract extraction w/ intraocular lens implant Left ~ 2008  . Cholecystectomy N/A 10/07/2012    Procedure: LAPAROSCOPIC CHOLECYSTECTOMY WITH INTRAOPERATIVE CHOLANGIOGRAM;  Surgeon: Shann Medal, MD;  Location: [facility];   Service: General;  Laterality: N/A;  . Ercp N/A 10/08/2012    Procedure: ENDOSCOPIC RETROGRADE CHOLANGIOPANCREATOGRAPHY (ERCP);  Surgeon: Gatha Mayer, MD;  Location: [facility];  Service: Endoscopy;  Laterality: N/A;  . Ercp w/ sphicterotomy  11/10/2012    stones removed [place]    Family History  Problem Relation Age of Onset  . Heart attack Father     DECEASED  . Stroke Father     DECEASED  . Leukemia Mother     DECEASED  . Heart attack Sister     ALIVE  . Hypertension Sister     Social History   Social History  . Marital Status: Single    Spouse Name: N/A  . Number of Children: N/A  . Years of Education: N/A   Occupational History  . Not on file.   Social History Main Topics  . Smoking status: Former Smoker -- 2.00 packs/day for 30 years    Types: Cigarettes    Quit date: 10/25/2002  . Smokeless tobacco: Never Used  . Alcohol Use: No  . Drug Use: No  . Sexual Activity: No   Other Topics Concern  . Not on file  Social History Narrative     Current outpatient prescriptions:  .  aspirin EC 81 MG tablet, Take 81 mg by mouth as needed. , Disp: , Rfl:  .  atorvastatin (LIPITOR) 80 MG tablet, Take 1 tablet (80 mg total) by mouth every morning. (Patient taking differently: Take 80 mg by mouth at bedtime. ), Disp: 30 tablet, Rfl: 12 .  carvedilol (COREG) 25 MG tablet, Take 1/2 tablet twice a day., Disp: 60 tablet, Rfl: 6 .  clopidogrel (PLAVIX) 75 MG tablet, Take 1 tablet (75 mg total) by mouth daily., Disp: 30 tablet, Rfl: 12 .  FLUoxetine (PROZAC) 20 MG capsule, TAKE 1 CAPSULE BY MOUTH ONCE EVERY MORNING, Disp: 30 capsule, Rfl: 6 .  furosemide (LASIX) 40 MG tablet, Take 1 tablet (40 mg total) by mouth every morning., Disp: 30 tablet, Rfl: 12 .  lisinopril (ZESTRIL) 2.5 MG tablet, Take 1 tablet (2.5 mg total) by mouth daily., Disp: 30 tablet, Rfl: 6 .  nitroGLYCERIN (NITROSTAT) 0.4 MG SL tablet, Place 0.4 mg under the tongue every 5 (five) minutes as needed for  chest pain., Disp: , Rfl:   No Known Allergies   Review of Systems  Constitutional: Negative for fever, chills, weight loss and malaise/fatigue.  HENT: Negative for hearing loss.   Eyes: Negative for blurred vision and double vision.  Respiratory: Negative for cough, shortness of breath and wheezing.   Cardiovascular: Negative for chest pain, palpitations and leg swelling.  Gastrointestinal: Positive for diarrhea (occ.). Negative for heartburn, abdominal pain and blood in stool.  Genitourinary: Negative for dysuria, urgency and frequency.  Musculoskeletal: Negative for myalgias and back pain.  Skin: Positive for rash. Negative for itching.  Neurological: Negative for dizziness, tremors, weakness and headaches.  Psychiatric/Behavioral: Positive for depression and hallucinations.       Thinks that she helps her grandmother with chores (she is deceased x many years).      Objective  Filed Vitals:   03/29/15 0829  BP: 122/80  Pulse: 66  Temp: 97.6 F (36.4 C)  TempSrc: Oral  Resp: 16  Height: 5\' 5"  (1.651 m)  Weight: 208 lb (94.348 kg)    Physical Exam  Constitutional: She is well-developed, well-nourished, and in no distress. No distress.  HENT:  Head: Normocephalic and atraumatic.  Eyes: Conjunctivae and EOM are normal. Pupils are equal, round, and reactive to light. No scleral icterus.  Neck: Normal range of motion. Neck supple. Carotid bruit is not present. No thyromegaly present.  Cardiovascular: Normal rate and regular rhythm.  Exam reveals no gallop and no friction rub.   Murmur heard.  Systolic murmur is present with a grade of 2/6  throughout  Pulmonary/Chest: Effort normal and breath sounds normal. No respiratory distress. She has no wheezes. She has no rales.  Musculoskeletal: She exhibits no edema.  Lymphadenopathy:    She has no cervical adenopathy.  Neurological: She is alert.  Disoriented, pleasantly  Skin:  Red, scaly rash on faced and bilateral lower  arms.  Vitals reviewed.      No results found for this or any previous visit (from the past 2160 hour(s)).   Assessment & Plan  Problem List Items Addressed This Visit      Cardiovascular and Mediastinum   Coronary artery disease - Primary   Relevant Orders   Ambulatory referral to Cardiology   HTN (hypertension)     Nervous and Auditory   Dementia     Other   Hyperlipidemia   Depression  No orders of the defined types were placed in this encounter.   1. Coronary artery disease involving native coronary artery of native heart without angina pectoris  - Ambulatory referral to Cardiology  2. Essential hypertension  Cont meds 3. Hyperlipidemia  Cont meds 4. Depression Cont. meds  5. Dementia, with behavioral disturbance Discussed further persuit of placement in Memory Care Unit.  Family will persue.

## 2015-04-07 ENCOUNTER — Encounter: Payer: Self-pay | Admitting: *Deleted

## 2015-04-25 ENCOUNTER — Inpatient Hospital Stay: Payer: Commercial Managed Care - HMO | Admitting: Family Medicine

## 2015-05-04 ENCOUNTER — Ambulatory Visit (INDEPENDENT_AMBULATORY_CARE_PROVIDER_SITE_OTHER): Payer: Medicare HMO | Admitting: Family Medicine

## 2015-05-04 ENCOUNTER — Encounter: Payer: Self-pay | Admitting: Family Medicine

## 2015-05-04 VITALS — BP 119/79 | HR 61 | Temp 97.7°F | Resp 16 | Ht 65.0 in | Wt 197.0 lb

## 2015-05-04 DIAGNOSIS — F0391 Unspecified dementia with behavioral disturbance: Secondary | ICD-10-CM | POA: Diagnosis not present

## 2015-05-04 NOTE — Progress Notes (Signed)
Name: Kaitlyn Simmons   MRN: [id number]    DOB: [birth year]   Date:05/04/2015       Progress Note  Subjective  Chief Complaint  Chief Complaint  Patient presents with  . Dementia    fl2 forms     HPI Here to have FL-2 form filled out for skilled nursing care.  No problem-specific assessment & plan notes found for this encounter.   Past Medical History  Diagnosis Date  . CAD (coronary artery disease)     a. s/p IL STEMI 7/11 => tx with BMS to RCA (tx at [facility]);  b.  AL STEMI 7/12 => LHC: mRCA 100% ==> PCI with BMS to RCA; LM 10%, pCFX 50%, OM1 10%, pLAD 50%, mLAD 99%, 70%  ==>  c. staged PCI: BMS to  LAD (tx at [facility])  . Chronic systolic heart failure ([place])   . HTN (hypertension)   . Hyperlipidemia   . Ischemic cardiomyopathy     a. EF 30-35% at time of AL STEMI 7/12;   b. IMPROVED by f/u Echo 9/13:  mild LVH, EF 45-50%, mid to dist Inf, AS, Apical HK, Gr 1 diast dysfn, MAC, mod LAE  . Cataracts, bilateral   . CHF (congestive heart failure) ([place])   . GERD (gastroesophageal reflux disease)   . STEMI (ST elevation myocardial infarction) ([place]) 2011; 2012    [place]; [place]  . Poor short-term memory   . Dementia     Past Surgical History  Procedure Laterality Date  . Tonsillectomy and adenoidectomy  1980's  . Coronary angioplasty with stent placement  2011; 2012    RCA; RCA & LAD  . Dilation and curettage of uterus  1970's  . Cataract extraction w/ intraocular lens implant Left ~ 2008  . Cholecystectomy N/A 10/07/2012    Procedure: LAPAROSCOPIC CHOLECYSTECTOMY WITH INTRAOPERATIVE CHOLANGIOGRAM;  Surgeon: Shann Medal, MD;  Location: [facility];  Service: General;  Laterality: N/A;  . Ercp N/A 10/08/2012    Procedure: ENDOSCOPIC RETROGRADE CHOLANGIOPANCREATOGRAPHY (ERCP);  Surgeon: Gatha Mayer, MD;  Location: [facility];  Service: Endoscopy;  Laterality: N/A;  . Ercp w/ sphicterotomy  11/10/2012    stones removed [place]    Family History  Problem Relation Age of Onset  . Heart attack  Father     DECEASED  . Stroke Father     DECEASED  . Leukemia Mother     DECEASED  . Heart attack Sister     ALIVE  . Hypertension Sister     Social History   Social History  . Marital Status: Single    Spouse Name: N/A  . Number of Children: N/A  . Years of Education: N/A   Occupational History  . Not on file.   Social History Main Topics  . Smoking status: Former Smoker -- 2.00 packs/day for 30 years    Types: Cigarettes    Quit date: 10/25/2002  . Smokeless tobacco: Never Used  . Alcohol Use: No  . Drug Use: No  . Sexual Activity: No   Other Topics Concern  . Not on file   Social History Narrative     Current outpatient prescriptions:  .  aspirin EC 81 MG tablet, Take 81 mg by mouth as needed. , Disp: , Rfl:  .  atorvastatin (LIPITOR) 80 MG tablet, Take 1 tablet (80 mg total) by mouth every morning. (Patient taking differently: Take 80 mg by mouth at bedtime. ), Disp: 30 tablet, Rfl: 12 .  carvedilol (COREG)  25 MG tablet, Take 1/2 tablet twice a day., Disp: 60 tablet, Rfl: 6 .  clopidogrel (PLAVIX) 75 MG tablet, Take 1 tablet (75 mg total) by mouth daily., Disp: 30 tablet, Rfl: 12 .  FLUoxetine (PROZAC) 20 MG capsule, TAKE 1 CAPSULE BY MOUTH ONCE EVERY MORNING, Disp: 30 capsule, Rfl: 6 .  furosemide (LASIX) 40 MG tablet, Take 1 tablet (40 mg total) by mouth every morning., Disp: 30 tablet, Rfl: 12 .  lisinopril (ZESTRIL) 2.5 MG tablet, Take 1 tablet (2.5 mg total) by mouth daily., Disp: 30 tablet, Rfl: 6 .  nitroGLYCERIN (NITROSTAT) 0.4 MG SL tablet, Place 0.4 mg under the tongue every 5 (five) minutes as needed for chest pain., Disp: , Rfl:   No Known Allergies   Review of Systems  Constitutional: Negative for fever, chills, weight loss and malaise/fatigue.  HENT: Negative for hearing loss.   Eyes: Negative for blurred vision and double vision.  Respiratory: Negative for cough, shortness of breath and wheezing.   Cardiovascular: Negative for chest pain,  palpitations and orthopnea.  Gastrointestinal: Negative for heartburn, abdominal pain and blood in stool.  Genitourinary: Negative for dysuria, urgency and frequency.  Musculoskeletal: Negative for myalgias and joint pain.  Skin: Negative for rash.  Neurological: Negative for dizziness, tremors, weakness and headaches.  Psychiatric/Behavioral: Negative for depression. The patient is not nervous/anxious and does not have insomnia.        Mild to moderate dementia.      Objective  Filed Vitals:   05/04/15 1315  BP: 119/79  Pulse: 61  Temp: 97.7 F (36.5 C)  TempSrc: Oral  Resp: 16  Height: 5\' 5"  (1.651 m)  Weight: 197 lb (89.359 kg)    Physical Exam  Constitutional: She is well-developed, well-nourished, and in no distress. No distress.  HENT:  Head: Normocephalic and atraumatic.  Eyes: Conjunctivae and EOM are normal. Pupils are equal, round, and reactive to light. No scleral icterus.  Neck: Normal range of motion. Neck supple. Carotid bruit is not present. No thyromegaly present.  Cardiovascular: Normal rate and regular rhythm.  Exam reveals no gallop and no friction rub.   Murmur heard.  Systolic murmur is present with a grade of 3/6  Throughout  Pulmonary/Chest: Effort normal and breath sounds normal. No respiratory distress. She has no wheezes. She has no rales.  Abdominal: Soft. Bowel sounds are normal. She exhibits no distension. There is no tenderness. There is no rebound.  Musculoskeletal: Normal range of motion. She exhibits no edema.  Lymphadenopathy:    She has no cervical adenopathy.  Neurological: She is alert. No cranial nerve deficit. Gait normal. Coordination normal.  Disoriented to place and time  Skin:  Multiple excoriations of bilateral lower legs.  Vitals reviewed.      No results found for this or any previous visit (from the past 2160 hour(s)).   Assessment & Plan  Problem List Items Addressed This Visit      Nervous and Auditory    Dementia - Primary      No orders of the defined types were placed in this encounter.   1. Dementia, with behavioral disturbance FL-2 form filled out for skilled Nursing placement (Memory Care Unit).

## 2015-05-05 ENCOUNTER — Ambulatory Visit: Payer: Commercial Managed Care - HMO | Admitting: Family Medicine

## 2015-05-30 ENCOUNTER — Ambulatory Visit: Payer: Self-pay | Admitting: Cardiovascular Disease

## 2015-05-30 ENCOUNTER — Encounter: Payer: Self-pay | Admitting: *Deleted

## 2015-06-20 ENCOUNTER — Emergency Department
Admission: EM | Admit: 2015-06-20 | Discharge: 2015-06-27 | Disposition: A | Discharge status: Home / Self Care | Payer: Medicare HMO | Attending: Emergency Medicine | Admitting: Emergency Medicine

## 2015-06-20 DIAGNOSIS — F329 Major depressive disorder, single episode, unspecified: Secondary | ICD-10-CM | POA: Diagnosis not present

## 2015-06-20 DIAGNOSIS — Z9889 Other specified postprocedural states: Secondary | ICD-10-CM | POA: Insufficient documentation

## 2015-06-20 DIAGNOSIS — F0391 Unspecified dementia with behavioral disturbance: Secondary | ICD-10-CM | POA: Diagnosis present

## 2015-06-20 DIAGNOSIS — Z9049 Acquired absence of other specified parts of digestive tract: Secondary | ICD-10-CM | POA: Diagnosis not present

## 2015-06-20 DIAGNOSIS — G301 Alzheimer's disease with late onset: Secondary | ICD-10-CM

## 2015-06-20 DIAGNOSIS — Z87891 Personal history of nicotine dependence: Secondary | ICD-10-CM | POA: Diagnosis not present

## 2015-06-20 DIAGNOSIS — F0390 Unspecified dementia without behavioral disturbance: Secondary | ICD-10-CM | POA: Insufficient documentation

## 2015-06-20 DIAGNOSIS — Z7982 Long term (current) use of aspirin: Secondary | ICD-10-CM | POA: Diagnosis not present

## 2015-06-20 DIAGNOSIS — R413 Other amnesia: Secondary | ICD-10-CM | POA: Insufficient documentation

## 2015-06-20 DIAGNOSIS — I5022 Chronic systolic (congestive) heart failure: Secondary | ICD-10-CM

## 2015-06-20 DIAGNOSIS — F0281 Dementia in other diseases classified elsewhere with behavioral disturbance: Secondary | ICD-10-CM

## 2015-06-20 DIAGNOSIS — Z6841 Body Mass Index (BMI) 40.0 and over, adult: Secondary | ICD-10-CM | POA: Insufficient documentation

## 2015-06-20 DIAGNOSIS — E785 Hyperlipidemia, unspecified: Secondary | ICD-10-CM | POA: Diagnosis not present

## 2015-06-20 DIAGNOSIS — I10 Essential (primary) hypertension: Secondary | ICD-10-CM | POA: Insufficient documentation

## 2015-06-20 DIAGNOSIS — I2510 Atherosclerotic heart disease of native coronary artery without angina pectoris: Secondary | ICD-10-CM | POA: Diagnosis not present

## 2015-06-20 DIAGNOSIS — I2583 Coronary atherosclerosis due to lipid rich plaque: Secondary | ICD-10-CM

## 2015-06-20 DIAGNOSIS — I255 Ischemic cardiomyopathy: Secondary | ICD-10-CM | POA: Insufficient documentation

## 2015-06-20 DIAGNOSIS — K219 Gastro-esophageal reflux disease without esophagitis: Secondary | ICD-10-CM | POA: Insufficient documentation

## 2015-06-20 DIAGNOSIS — R7989 Other specified abnormal findings of blood chemistry: Secondary | ICD-10-CM | POA: Diagnosis not present

## 2015-06-20 DIAGNOSIS — I252 Old myocardial infarction: Secondary | ICD-10-CM | POA: Diagnosis not present

## 2015-06-20 DIAGNOSIS — I517 Cardiomegaly: Secondary | ICD-10-CM | POA: Diagnosis not present

## 2015-06-20 DIAGNOSIS — R1013 Epigastric pain: Secondary | ICD-10-CM | POA: Diagnosis not present

## 2015-06-20 DIAGNOSIS — R001 Bradycardia, unspecified: Secondary | ICD-10-CM | POA: Insufficient documentation

## 2015-06-20 DIAGNOSIS — Z79899 Other long term (current) drug therapy: Secondary | ICD-10-CM | POA: Diagnosis not present

## 2015-06-20 DIAGNOSIS — N3000 Acute cystitis without hematuria: Secondary | ICD-10-CM

## 2015-06-20 DIAGNOSIS — K8050 Calculus of bile duct without cholangitis or cholecystitis without obstruction: Secondary | ICD-10-CM | POA: Insufficient documentation

## 2015-06-20 LAB — CBC WITH DIFFERENTIAL/PLATELET
BASOS PCT: 1 %
Basophils Absolute: 0.1 10*3/uL (ref 0–0.1)
Eosinophils Absolute: 0.2 10*3/uL (ref 0–0.7)
Eosinophils Relative: 2 %
HEMATOCRIT: 40 % (ref 35.0–47.0)
HEMOGLOBIN: 13.2 g/dL (ref 12.0–16.0)
LYMPHS ABS: 1.7 10*3/uL (ref 1.0–3.6)
Lymphocytes Relative: 14 %
MCH: 29.7 pg (ref 26.0–34.0)
MCHC: 33.1 g/dL (ref 32.0–36.0)
MCV: 89.6 fL (ref 80.0–100.0)
MONOS PCT: 8 %
Monocytes Absolute: 1 10*3/uL — ABNORMAL HIGH (ref 0.2–0.9)
NEUTROS ABS: 9.2 10*3/uL — AB (ref 1.4–6.5)
NEUTROS PCT: 75 %
Platelets: 249 10*3/uL (ref 150–440)
RBC: 4.46 MIL/uL (ref 3.80–5.20)
RDW: 14.5 % (ref 11.5–14.5)
WBC: 12.2 10*3/uL — AB (ref 3.6–11.0)

## 2015-06-20 LAB — URINALYSIS COMPLETE WITH MICROSCOPIC (ARMC ONLY)
BILIRUBIN URINE: NEGATIVE
GLUCOSE, UA: NEGATIVE mg/dL
Ketones, ur: NEGATIVE mg/dL
Nitrite: POSITIVE — AB
Protein, ur: NEGATIVE mg/dL
SPECIFIC GRAVITY, URINE: 1.023 (ref 1.005–1.030)
pH: 5 (ref 5.0–8.0)

## 2015-06-20 LAB — URINE DRUG SCREEN, QUALITATIVE (ARMC ONLY)
Amphetamines, Ur Screen: NOT DETECTED
BENZODIAZEPINE, UR SCRN: NOT DETECTED
Barbiturates, Ur Screen: NOT DETECTED
COCAINE METABOLITE, UR ~~LOC~~: NOT DETECTED
Cannabinoid 50 Ng, Ur ~~LOC~~: NOT DETECTED
MDMA (ECSTASY) UR SCREEN: NOT DETECTED
METHADONE SCREEN, URINE: NOT DETECTED
OPIATE, UR SCREEN: NOT DETECTED
Phencyclidine (PCP) Ur S: NOT DETECTED
Tricyclic, Ur Screen: NOT DETECTED

## 2015-06-20 LAB — COMPREHENSIVE METABOLIC PANEL
ALBUMIN: 4.2 g/dL (ref 3.5–5.0)
ALT: 14 U/L (ref 14–54)
ANION GAP: 7 (ref 5–15)
AST: 22 U/L (ref 15–41)
Alkaline Phosphatase: 70 U/L (ref 38–126)
BUN: 18 mg/dL (ref 6–20)
CO2: 26 mmol/L (ref 22–32)
Calcium: 9.6 mg/dL (ref 8.9–10.3)
Chloride: 107 mmol/L (ref 101–111)
Creatinine, Ser: 1.11 mg/dL — ABNORMAL HIGH (ref 0.44–1.00)
GFR calc non Af Amer: 49 mL/min — ABNORMAL LOW (ref >60)
GFR, EST AFRICAN AMERICAN: 57 mL/min — AB (ref >60)
GLUCOSE: 102 mg/dL — AB (ref 65–99)
POTASSIUM: 4 mmol/L (ref 3.5–5.1)
SODIUM: 140 mmol/L (ref 135–145)
Total Bilirubin: 0.8 mg/dL (ref 0.3–1.2)
Total Protein: 7.6 g/dL (ref 6.5–8.1)

## 2015-06-20 LAB — ETHANOL: Alcohol, Ethyl (B): <5 mg/dL (ref <5)

## 2015-06-20 NOTE — ED Notes (Signed)
Pt presents to ED with [facility] under IVC. Pt had been missing after wandering from her home between 1600 and 2100. Pt was hiding in a building when found by police dept and clothing appears to be mostly dry. Family concerned because of pt frequent wandering from home. Pt son states 6 years ago pt had a heart attack and now suffers from short term memory loss; has been progressively more confused since then. Pt alert and calm at this time. No distress noted.

## 2015-06-21 ENCOUNTER — Encounter: Payer: Self-pay | Admitting: Emergency Medicine

## 2015-06-21 DIAGNOSIS — F0281 Dementia in other diseases classified elsewhere with behavioral disturbance: Secondary | ICD-10-CM

## 2015-06-21 DIAGNOSIS — F0391 Unspecified dementia with behavioral disturbance: Secondary | ICD-10-CM | POA: Diagnosis not present

## 2015-06-21 DIAGNOSIS — G301 Alzheimer's disease with late onset: Secondary | ICD-10-CM

## 2015-06-21 MED ORDER — HALOPERIDOL LACTATE 5 MG/ML IJ SOLN
INTRAMUSCULAR | Status: AC
Start: 2015-06-21 — End: 2015-06-22
  Filled 2015-06-21: qty 1

## 2015-06-21 MED ORDER — CEPHALEXIN 500 MG PO CAPS
500.0000 mg | ORAL_CAPSULE | Freq: Two times a day (BID) | ORAL | Status: AC
  Administered 2015-06-21 – 2015-06-25 (×10): 500 mg via ORAL
  Filled 2015-06-21 (×10): qty 1

## 2015-06-21 MED ORDER — FLUOXETINE HCL 20 MG PO CAPS
20.0000 mg | ORAL_CAPSULE | Freq: Every day | ORAL | Status: DC
  Administered 2015-06-21 – 2015-06-27 (×7): 20 mg via ORAL
  Filled 2015-06-21 (×7): qty 1

## 2015-06-21 MED ORDER — DIPHENHYDRAMINE HCL 50 MG/ML IJ SOLN
INTRAMUSCULAR | Status: AC
  Administered 2015-06-21: 50 mg via INTRAVENOUS
  Filled 2015-06-21: qty 1

## 2015-06-21 MED ORDER — FUROSEMIDE 40 MG PO TABS
40.0000 mg | ORAL_TABLET | Freq: Every day | ORAL | Status: DC
  Administered 2015-06-21 – 2015-06-27 (×7): 40 mg via ORAL
  Filled 2015-06-21 (×7): qty 1

## 2015-06-21 MED ORDER — DIPHENHYDRAMINE HCL 50 MG/ML IJ SOLN
50.0000 mg | Freq: Once | INTRAMUSCULAR | Status: AC
  Administered 2015-06-21: 50 mg via INTRAVENOUS

## 2015-06-21 MED ORDER — DIPHENHYDRAMINE HCL 50 MG/ML IJ SOLN
INTRAMUSCULAR | Status: AC
Start: 2015-06-21 — End: 2015-06-22
  Filled 2015-06-21: qty 1

## 2015-06-21 MED ORDER — HALOPERIDOL LACTATE 5 MG/ML IJ SOLN
INTRAMUSCULAR | Status: AC
  Administered 2015-06-21: 5 mg via INTRAMUSCULAR
  Filled 2015-06-21: qty 1

## 2015-06-21 MED ORDER — HALOPERIDOL LACTATE 5 MG/ML IJ SOLN
5.0000 mg | Freq: Once | INTRAMUSCULAR | Status: AC
  Administered 2015-06-21: 5 mg via INTRAMUSCULAR

## 2015-06-21 MED ORDER — NITROGLYCERIN 0.4 MG SL SUBL
0.4000 mg | SUBLINGUAL_TABLET | SUBLINGUAL | Status: DC | PRN
  Filled 2015-06-21: qty 25

## 2015-06-21 MED ORDER — CARVEDILOL 6.25 MG PO TABS
12.5000 mg | ORAL_TABLET | Freq: Two times a day (BID) | ORAL | Status: DC
  Administered 2015-06-22 – 2015-06-25 (×5): 12.5 mg via ORAL
  Filled 2015-06-21 (×4): qty 2
  Filled 2015-06-21: qty 1
  Filled 2015-06-21 (×3): qty 2

## 2015-06-21 MED ORDER — HALOPERIDOL LACTATE 5 MG/ML IJ SOLN
2.0000 mg | Freq: Once | INTRAMUSCULAR | Status: AC
  Administered 2015-06-21: 2 mg via INTRAMUSCULAR

## 2015-06-21 MED ORDER — CLOPIDOGREL BISULFATE 75 MG PO TABS
75.0000 mg | ORAL_TABLET | Freq: Every day | ORAL | Status: DC
  Administered 2015-06-21 – 2015-06-27 (×7): 75 mg via ORAL
  Filled 2015-06-21 (×7): qty 1

## 2015-06-21 MED ORDER — ASPIRIN EC 81 MG PO TBEC
81.0000 mg | DELAYED_RELEASE_TABLET | Freq: Every day | ORAL | Status: DC
  Administered 2015-06-21 – 2015-06-27 (×7): 81 mg via ORAL
  Filled 2015-06-21 (×7): qty 1

## 2015-06-21 MED ORDER — ATORVASTATIN CALCIUM 20 MG PO TABS
80.0000 mg | ORAL_TABLET | Freq: Every day | ORAL | Status: DC
  Administered 2015-06-21 – 2015-06-26 (×5): 80 mg via ORAL
  Filled 2015-06-21 (×5): qty 4

## 2015-06-21 MED ORDER — DIPHENHYDRAMINE HCL 50 MG/ML IJ SOLN
25.0000 mg | Freq: Once | INTRAMUSCULAR | Status: AC
  Administered 2015-06-21: 25 mg via INTRAVENOUS

## 2015-06-21 MED ORDER — LISINOPRIL 5 MG PO TABS
2.5000 mg | ORAL_TABLET | Freq: Every day | ORAL | Status: DC
  Administered 2015-06-23 – 2015-06-27 (×3): 2.5 mg via ORAL
  Filled 2015-06-21 (×5): qty 0.5

## 2015-06-21 MED ORDER — RISPERIDONE 0.5 MG PO TBDP
0.2500 mg | ORAL_TABLET | Freq: Four times a day (QID) | ORAL | Status: DC | PRN
  Administered 2015-06-24: 0.25 mg via ORAL
  Filled 2015-06-21: qty 0.5

## 2015-06-21 NOTE — ED Notes (Signed)
Pt brought from main ed with dementia she is very confused and trying to leave out every door, she refuses to eat or drink, ed charge nurse informed that this pt has dementia and is not for psych adm and possibly does meet criteria for bhu

## 2015-06-21 NOTE — ED Notes (Signed)
Patient given peanut butter crackers and water for snack

## 2015-06-21 NOTE — ED Notes (Signed)
Pt attempting to leave, this nurse and security officer escorted pt back to room. EDP made aware and IVC paperwork completed

## 2015-06-21 NOTE — ED Notes (Signed)
This RN attempted to call report twice to [place] given this RN's name and ascom number to call back for report

## 2015-06-21 NOTE — Consult Note (Signed)
[facility] Consult   Reason for Consult:  Consult for this 70 year old woman with a history of multiple medical problems and dementia who was brought in by law enforcement this morning after being found lost in the community Referring Physician:  Owens Shark Patient Identification: Kaitlyn Simmons MRN:  [id number] Principal Diagnosis: Dementia with behavioral disturbance Diagnosis:   Patient Active Problem List   Diagnosis Date Noted  . Dementia with behavioral disturbance [F03.91] 06/21/2015  . Dementia [F03.90] 03/29/2015  . Body mass index 40.0-44.9, adult ([place]) [Z68.41] 12/17/2014  . Morbid obesity ([place]) [E66.01] 12/17/2014  . Depression [F32.9] 09/17/2014  . Calculus of bile duct without mention of cholecystitis or obstruction [K80.50] 10/08/2012  . Abnormal cholangiogram [R93.2] 10/08/2012  . Mucosal abnormality of duodenum [K31.9] 10/08/2012  . Abnormal LFTs (liver function tests) [R79.89] 09/12/2012  . Abdominal pain, epigastric [R10.13] 09/12/2012  . Non-STEMI (non-ST elevated myocardial infarction) ([place]) [I21.4] 09/11/2012  . CAD (coronary artery disease) [I25.10]   . Myocardial infarction ([place]) [I21.3]   . Coronary artery disease [I25.10]   . CHF (congestive heart failure) ([place]) [I50.9]   . HTN (hypertension) [I10]   . Hyperlipidemia [E78.5]   . Ischemic cardiomyopathy [I25.5]     Total Time spent with patient: 1 hour  Subjective:   Kaitlyn Simmons is a 70 y.o. female patient admitted with "I don't know".  HPI:  Patient interviewed. Chart reviewed. Labs and vitals reviewed. Case discussed with social work and ER physician. 70 year old woman was brought in by Event organiser. The report was that she had wandered away from her place of living and was found hiding in a building somewhere. Patient on interview this morning could not remember anything about how she came to be in the hospital. She was not able in fact to tell me where she was currently nor was she able  to tell me the correct year or anything about her current situation. She denied any specific physical complaints denying pain and nausea and shortness of breath chest pain or fatigue. She denied having auditory or visual hallucinations. Denied any suicidal or homicidal ideation. Patient has no idea whether she is on medication at baseline and has no idea whether she has been taking them. She is an unreliable historian as far as her recent living situation and where she has been recently or anything about recent events. After she was brought into the emergency room it sounds like adult protective services became involved and are investigating. At this point it still remains unclear to me where she had actually been living.  Past psychiatric history: Other than a documented history of dementia that has been progressive since she had a heart attack there is no known past psychiatric history. No history of suicidal or violent behavior.  Medical history: History of multiple medical problems including hypertension and hyperlipidemia history of myocardial infarction and coronary artery disease, history of congestive heart failure.  Substance abuse history: Patient denies any lifetime use of alcohol or drugs or any substance abuse problems  Past Psychiatric History: Prozac is listed as being one of her medicine although there is nothing in her chart to suggest a full blown diagnosis of depression. It was mentioned in passing by some of her doctors. No history of psychiatric hospitalization no history of suicidal or aggressive behavior. No known history of inpatient treatment.  Risk to Self: Is patient at risk for suicide?: No Risk to Others:   Prior Inpatient Therapy:   Prior Outpatient Therapy:  Past Medical History:  Past Medical History  Diagnosis Date  . CAD (coronary artery disease)     a. s/p IL STEMI 7/11 => tx with BMS to RCA (tx at [facility]);  b.  AL STEMI 7/12 => LHC: mRCA 100% ==> PCI with BMS to  RCA; LM 10%, pCFX 50%, OM1 10%, pLAD 50%, mLAD 99%, 70%  ==>  c. staged PCI: BMS to  LAD (tx at [facility])  . Chronic systolic heart failure ([place])   . HTN (hypertension)   . Hyperlipidemia   . Ischemic cardiomyopathy     a. EF 30-35% at time of AL STEMI 7/12;   b. IMPROVED by f/u Echo 9/13:  mild LVH, EF 45-50%, mid to dist Inf, AS, Apical HK, Gr 1 diast dysfn, MAC, mod LAE  . Cataracts, bilateral   . CHF (congestive heart failure) ([place])   . GERD (gastroesophageal reflux disease)   . STEMI (ST elevation myocardial infarction) ([place]) 2011; 2012    [place]; [place]  . Poor short-term memory   . Dementia     Past Surgical History  Procedure Laterality Date  . Tonsillectomy and adenoidectomy  1980's  . Coronary angioplasty with stent placement  2011; 2012    RCA; RCA & LAD  . Dilation and curettage of uterus  1970's  . Cataract extraction w/ intraocular lens implant Left ~ 2008  . Cholecystectomy N/A 10/07/2012    Procedure: LAPAROSCOPIC CHOLECYSTECTOMY WITH INTRAOPERATIVE CHOLANGIOGRAM;  Surgeon: Shann Medal, MD;  Location: [facility];  Service: General;  Laterality: N/A;  . Ercp N/A 10/08/2012    Procedure: ENDOSCOPIC RETROGRADE CHOLANGIOPANCREATOGRAPHY (ERCP);  Surgeon: Gatha Mayer, MD;  Location: [facility];  Service: Endoscopy;  Laterality: N/A;  . Ercp w/ sphicterotomy  11/10/2012    stones removed [place]   Family History:  Family History  Problem Relation Age of Onset  . Heart attack Father     DECEASED  . Stroke Father     DECEASED  . Leukemia Mother     DECEASED  . Heart attack Sister     ALIVE  . Hypertension Sister    Family Psychiatric  History: Patient was not able to give any history about family history despite my asking. Social History:  History  Alcohol Use No     History  Drug Use No    Social History   Social History  . Marital Status: Single    Spouse Name: N/A  . Number of Children: N/A  . Years of Education: N/A   Social History Main Topics  . Smoking  status: Former Smoker -- 2.00 packs/day for 30 years    Types: Cigarettes    Quit date: 10/25/2002  . Smokeless tobacco: Never Used  . Alcohol Use: No  . Drug Use: No  . Sexual Activity: No   Other Topics Concern  . None   Social History Narrative   Additional Social History:    Allergies:  No Known Allergies  Labs:  Results for orders placed or performed during the hospital encounter of 06/20/15 (from the past 48 hour(s))  Comprehensive metabolic panel     Status: Abnormal   Collection Time: 06/20/15 10:09 PM  Result Value Ref Range   Sodium 140 135 - 145 mmol/L   Potassium 4.0 3.5 - 5.1 mmol/L   Chloride 107 101 - 111 mmol/L   CO2 26 22 - 32 mmol/L   Glucose, Bld 102 (H) 65 - 99 mg/dL   BUN 18 6 - 20  mg/dL   Creatinine, Ser 1.11 (H) 0.44 - 1.00 mg/dL   Calcium 9.6 8.9 - 10.3 mg/dL   Total Protein 7.6 6.5 - 8.1 g/dL   Albumin 4.2 3.5 - 5.0 g/dL   AST 22 15 - 41 U/L   ALT 14 14 - 54 U/L   Alkaline Phosphatase 70 38 - 126 U/L   Total Bilirubin 0.8 0.3 - 1.2 mg/dL   GFR calc non Af Amer 49 (L) >60 mL/min   GFR calc Af Amer 57 (L) >60 mL/min    Comment: (NOTE) The eGFR has been calculated using the CKD EPI equation. This calculation has not been validated in all clinical situations. eGFR's persistently <60 mL/min signify possible Chronic Kidney Disease.    Anion gap 7 5 - 15  Ethanol     Status: None   Collection Time: 06/20/15 10:09 PM  Result Value Ref Range   Alcohol, Ethyl (B) <5 <5 mg/dL    Comment:        LOWEST DETECTABLE LIMIT FOR SERUM ALCOHOL IS 5 mg/dL FOR MEDICAL PURPOSES ONLY   CBC with Diff     Status: Abnormal   Collection Time: 06/20/15 10:09 PM  Result Value Ref Range   WBC 12.2 (H) 3.6 - 11.0 K/uL   RBC 4.46 3.80 - 5.20 MIL/uL   Hemoglobin 13.2 12.0 - 16.0 g/dL   HCT 40.0 35.0 - 47.0 %   MCV 89.6 80.0 - 100.0 fL   MCH 29.7 26.0 - 34.0 pg   MCHC 33.1 32.0 - 36.0 g/dL   RDW 14.5 11.5 - 14.5 %   Platelets 249 150 - 440 K/uL   Neutrophils  Relative % 75 %   Neutro Abs 9.2 (H) 1.4 - 6.5 K/uL   Lymphocytes Relative 14 %   Lymphs Abs 1.7 1.0 - 3.6 K/uL   Monocytes Relative 8 %   Monocytes Absolute 1.0 (H) 0.2 - 0.9 K/uL   Eosinophils Relative 2 %   Eosinophils Absolute 0.2 0 - 0.7 K/uL   Basophils Relative 1 %   Basophils Absolute 0.1 0 - 0.1 K/uL  Urine Drug Screen, Qualitative ([facility] only)     Status: None   Collection Time: 06/20/15 10:09 PM  Result Value Ref Range   Tricyclic, Ur Screen NONE DETECTED NONE DETECTED   Amphetamines, Ur Screen NONE DETECTED NONE DETECTED   MDMA (Ecstasy)Ur Screen NONE DETECTED NONE DETECTED   Cocaine Metabolite,Ur [place] NONE DETECTED NONE DETECTED   Opiate, Ur Screen NONE DETECTED NONE DETECTED   Phencyclidine (PCP) Ur S NONE DETECTED NONE DETECTED   Cannabinoid 50 Ng, Ur [place] NONE DETECTED NONE DETECTED   Barbiturates, Ur Screen NONE DETECTED NONE DETECTED   Benzodiazepine, Ur Scrn NONE DETECTED NONE DETECTED   Methadone Scn, Ur NONE DETECTED NONE DETECTED    Comment: (NOTE) 496  Tricyclics, urine               Cutoff 1000 ng/mL 200  Amphetamines, urine             Cutoff 1000 ng/mL 300  MDMA (Ecstasy), urine           Cutoff 500 ng/mL 400  Cocaine Metabolite, urine       Cutoff 300 ng/mL 500  Opiate, urine                   Cutoff 300 ng/mL 600  Phencyclidine (PCP), urine      Cutoff 25 ng/mL 700  Cannabinoid, urine  Cutoff 50 ng/mL 800  Barbiturates, urine             Cutoff 200 ng/mL 900  Benzodiazepine, urine           Cutoff 200 ng/mL 1000 Methadone, urine                Cutoff 300 ng/mL 1100 1200 The urine drug screen provides only a preliminary, unconfirmed 1300 analytical test result and should not be used for non-medical 1400 purposes. Clinical consideration and professional judgment should 1500 be applied to any positive drug screen result due to possible 1600 interfering substances. A more specific alternate chemical method 1700 must be used in order to obtain a  confirmed analytical result.  1800 Gas chromato graphy / mass spectrometry (GC/MS) is the preferred 1900 confirmatory method.   Urinalysis complete, with microscopic ([facility] only)     Status: Abnormal   Collection Time: 06/20/15 10:09 PM  Result Value Ref Range   Color, Urine AMBER (A) YELLOW   APPearance HAZY (A) CLEAR   Glucose, UA NEGATIVE NEGATIVE mg/dL   Bilirubin Urine NEGATIVE NEGATIVE   Ketones, ur NEGATIVE NEGATIVE mg/dL   Specific Gravity, Urine 1.023 1.005 - 1.030   Hgb urine dipstick 1+ (A) NEGATIVE   pH 5.0 5.0 - 8.0   Protein, ur NEGATIVE NEGATIVE mg/dL   Nitrite POSITIVE (A) NEGATIVE   Leukocytes, UA 2+ (A) NEGATIVE   RBC / HPF 0-5 0 - 5 RBC/hpf   WBC, UA 6-30 0 - 5 WBC/hpf   Bacteria, UA FEW (A) NONE SEEN   Squamous Epithelial / LPF 0-5 (A) NONE SEEN   Mucous PRESENT    Hyaline Casts, UA PRESENT     Current Facility-Administered Medications  Medication Dose Route Frequency Provider Last Rate Last Dose  . aspirin EC tablet 81 mg  81 mg Oral Daily Gonzella Lex, MD      . atorvastatin (LIPITOR) tablet 80 mg  80 mg Oral q1800 Gonzella Lex, MD      . carvedilol (COREG) tablet 12.5 mg  12.5 mg Oral BID WC Gonzella Lex, MD      . cephALEXin (KEFLEX) capsule 500 mg  500 mg Oral Q12H Gregor Hams, MD   500 mg at 06/21/15 1151  . clopidogrel (PLAVIX) tablet 75 mg  75 mg Oral Daily Gonzella Lex, MD      . FLUoxetine (PROZAC) capsule 20 mg  20 mg Oral Daily John T Clapacs, MD      . furosemide (LASIX) tablet 40 mg  40 mg Oral Daily John T Clapacs, MD      . lisinopril (PRINIVIL,ZESTRIL) tablet 2.5 mg  2.5 mg Oral Daily Gonzella Lex, MD      . nitroGLYCERIN (NITROSTAT) SL tablet 0.4 mg  0.4 mg Sublingual Q5 min PRN Gonzella Lex, MD       Current Outpatient Prescriptions  Medication Sig Dispense Refill  . aspirin EC 81 MG tablet Take 81 mg by mouth as needed.     Marland Kitchen atorvastatin (LIPITOR) 80 MG tablet Take 1 tablet (80 mg total) by mouth every morning.  (Patient taking differently: Take 80 mg by mouth at bedtime. ) 30 tablet 12  . carvedilol (COREG) 25 MG tablet Take 1/2 tablet twice a day. 60 tablet 6  . clopidogrel (PLAVIX) 75 MG tablet Take 1 tablet (75 mg total) by mouth daily. 30 tablet 12  . FLUoxetine (PROZAC) 20 MG capsule TAKE 1 CAPSULE BY MOUTH  ONCE EVERY MORNING 30 capsule 6  . furosemide (LASIX) 40 MG tablet Take 1 tablet (40 mg total) by mouth every morning. 30 tablet 12  . lisinopril (ZESTRIL) 2.5 MG tablet Take 1 tablet (2.5 mg total) by mouth daily. 30 tablet 6  . nitroGLYCERIN (NITROSTAT) 0.4 MG SL tablet Place 0.4 mg under the tongue every 5 (five) minutes as needed for chest pain.      Musculoskeletal: Strength & Muscle Tone: decreased Gait & Station: unsteady Patient leans: N/A  Psychiatric Specialty Exam: Review of Systems  Constitutional: Negative.   HENT: Negative.   Eyes: Negative.   Respiratory: Negative.   Cardiovascular: Negative.   Gastrointestinal: Negative.   Musculoskeletal: Negative.   Skin: Negative.   Neurological: Negative.   Psychiatric/Behavioral: Positive for memory loss. Negative for depression, suicidal ideas, hallucinations and substance abuse. The patient is not nervous/anxious and does not have insomnia.     Blood pressure 110/47, pulse 51, temperature 97.8 F (36.6 C), temperature source Oral, resp. rate 18, height '5\' 3"'$  (1.6 m), weight 89.359 kg (197 lb), SpO2 98 %.Body mass index is 34.91 kg/(m^2).  General Appearance: Disheveled  Eye Sport and exercise psychologist::  Fair  Speech:  Slow  Volume:  Decreased  Mood:  Euthymic  Affect:  Constricted  Thought Process:  Disorganized  Orientation:  Negative  Thought Content:  Confusion. Very demented  Suicidal Thoughts:  No  Homicidal Thoughts:  No  Memory:  Very poor. Couldn't even repeat 1 out of 3 objects after several attempts to get her to repeat.  Judgement:  Impaired  Insight:  Lacking  Psychomotor Activity:  Decreased  Concentration:  Poor   Recall:  Poor  Fund of Knowledge:Poor  Language: Poor  Akathisia:  No  Handed:  Right  AIMS (if indicated):     Assets:  Social Support  ADL's:  Impaired  Cognition: Impaired,  Moderate and Severe  Sleep:      Treatment Plan Summary: Medication management and Plan 70 year old woman who is really quite demented. I did multiple parts of the Mini-Mental status exam and she got pretty much all of them wrong. Couldn't even repeat 1 out of 3 words immediately. Not able to give lucid answers to most questions. Couldn't tell me what year it was or where she was currently. Not able to concentrate. Patient appears to of wandered away from her living place but it's not clear to me where that was. Looks like recently her primary care doctor and filled out an [employer] to form with this statement that she was going to skilled nursing but right now we don't know where she was living most recently. She does appear to possibly have a urinary tract infection which could account for some degree of increased agitation. Patient does not need admission to the psychiatry ward area patient needs to be placed back into a safe environment. I have restarted her usual medications. I discussed this with social work. Adult Protective Services is involved. Hopefully we will find an appropriate disposition soon. Any agitation in the meantime can be controlled with low doses of antipsychotics if needed.  Disposition: Patient does not meet criteria for psychiatric inpatient admission. Supportive therapy provided about ongoing stressors.  Alethia Berthold, MD 06/21/2015 4:12 PM

## 2015-06-21 NOTE — ED Provider Notes (Signed)
-----------------------------------------   4:13 PM on 06/21/2015 -----------------------------------------  It does not appear that the Police Department submitted IVC paperwork. Unfortunately, the patient is demented, agitated, intermittently aggressive, attempting to leave the emergency department and she is a considerable safety concern. I'll place involuntary commitment. Awaiting psychiatry consult.  Joanne Gavel, MD 06/21/15 2010

## 2015-06-21 NOTE — ED Provider Notes (Signed)
[facility] Emergency Department Provider Note  ____________________________________________  Time seen: 12:30 AM  I have reviewed the triage vital signs and the nursing notes.   HISTORY  Chief Complaint Psychiatric Evaluation    HPI Kaitlyn Simmons is a 70 y.o. female presents to the emergency department via [facility] Department involuntarily committed. Per police department patient wondered away from home between the hours of 4 and 9 PM and was found hiding in a building by police officers. Patient now agitated in the emergency department requesting to leave stating that her family is here despite the fact that they are not. Per report the patient's son stated that the patient had a heart attack approximately 6 years ago which resulted in short-term memory loss with progressive confusion since then.     Past Medical History  Diagnosis Date  . CAD (coronary artery disease)     a. s/p IL STEMI 7/11 => tx with BMS to RCA (tx at [facility]);  b.  AL STEMI 7/12 => LHC: mRCA 100% ==> PCI with BMS to RCA; LM 10%, pCFX 50%, OM1 10%, pLAD 50%, mLAD 99%, 70%  ==>  c. staged PCI: BMS to  LAD (tx at [facility])  . Chronic systolic heart failure ([place])   . HTN (hypertension)   . Hyperlipidemia   . Ischemic cardiomyopathy     a. EF 30-35% at time of AL STEMI 7/12;   b. IMPROVED by f/u Echo 9/13:  mild LVH, EF 45-50%, mid to dist Inf, AS, Apical HK, Gr 1 diast dysfn, MAC, mod LAE  . Cataracts, bilateral   . CHF (congestive heart failure) ([place])   . GERD (gastroesophageal reflux disease)   . STEMI (ST elevation myocardial infarction) ([place]) 2011; 2012    [place]; [place]  . Poor short-term memory   . Dementia     Patient Active Problem List   Diagnosis Date Noted  . Dementia 03/29/2015  . Body mass index 40.0-44.9, adult ([place]) 12/17/2014  . Morbid obesity ([place]) 12/17/2014  . Depression 09/17/2014  . Calculus of bile duct without mention of cholecystitis or obstruction 10/08/2012   . Abnormal cholangiogram 10/08/2012  . Mucosal abnormality of duodenum 10/08/2012  . Abnormal LFTs (liver function tests) 09/12/2012  . Abdominal pain, epigastric 09/12/2012  . Non-STEMI (non-ST elevated myocardial infarction) ([place]) 09/11/2012  . CAD (coronary artery disease)   . Myocardial infarction ([place])   . Coronary artery disease   . CHF (congestive heart failure) ([place])   . HTN (hypertension)   . Hyperlipidemia   . Ischemic cardiomyopathy     Past Surgical History  Procedure Laterality Date  . Tonsillectomy and adenoidectomy  1980's  . Coronary angioplasty with stent placement  2011; 2012    RCA; RCA & LAD  . Dilation and curettage of uterus  1970's  . Cataract extraction w/ intraocular lens implant Left ~ 2008  . Cholecystectomy N/A 10/07/2012    Procedure: LAPAROSCOPIC CHOLECYSTECTOMY WITH INTRAOPERATIVE CHOLANGIOGRAM;  Surgeon: Shann Medal, MD;  Location: [facility];  Service: General;  Laterality: N/A;  . Ercp N/A 10/08/2012    Procedure: ENDOSCOPIC RETROGRADE CHOLANGIOPANCREATOGRAPHY (ERCP);  Surgeon: Gatha Mayer, MD;  Location: [facility];  Service: Endoscopy;  Laterality: N/A;  . Ercp w/ sphicterotomy  11/10/2012    stones removed [facility]    Current Outpatient Rx  Name  Route  Sig  Dispense  Refill  . aspirin EC 81 MG tablet   Oral   Take 81 mg by mouth  as needed.          Marland Kitchen atorvastatin (LIPITOR) 80 MG tablet   Oral   Take 1 tablet (80 mg total) by mouth every morning. Patient taking differently: Take 80 mg by mouth at bedtime.    30 tablet   12   . carvedilol (COREG) 25 MG tablet      Take 1/2 tablet twice a day.   60 tablet   6   . clopidogrel (PLAVIX) 75 MG tablet   Oral   Take 1 tablet (75 mg total) by mouth daily.   30 tablet   12   . FLUoxetine (PROZAC) 20 MG capsule      TAKE 1 CAPSULE BY MOUTH ONCE EVERY MORNING   30 capsule   6   . furosemide (LASIX) 40 MG tablet   Oral   Take 1 tablet (40 mg total) by mouth every morning.   30  tablet   12   . lisinopril (ZESTRIL) 2.5 MG tablet   Oral   Take 1 tablet (2.5 mg total) by mouth daily.   30 tablet   6   . nitroGLYCERIN (NITROSTAT) 0.4 MG SL tablet   Sublingual   Place 0.4 mg under the tongue every 5 (five) minutes as needed for chest pain.           Allergies No known drug allergies  Family History  Problem Relation Age of Onset  . Heart attack Father     DECEASED  . Stroke Father     DECEASED  . Leukemia Mother     DECEASED  . Heart attack Sister     ALIVE  . Hypertension Sister     Social History Social History  Substance Use Topics  . Smoking status: Former Smoker -- 2.00 packs/day for 30 years    Types: Cigarettes    Quit date: 10/25/2002  . Smokeless tobacco: Never Used  . Alcohol Use: No    Review of Systems  Constitutional: Negative for fever. Eyes: Negative for visual changes. ENT: Negative for sore throat. Cardiovascular: Negative for chest pain. Respiratory: Negative for shortness of breath. Gastrointestinal: Negative for abdominal pain, vomiting and diarrhea. Genitourinary: Negative for dysuria. Musculoskeletal: Negative for back pain. Skin: Negative for rash. Neurological: Negative for headaches, focal weakness or numbness. Psychiatric:Positive for agitation and confusion  10-point ROS otherwise negative.  ____________________________________________   PHYSICAL EXAM:  VITAL SIGNS: ED Triage Vitals  Enc Vitals Group     BP 06/20/15 2204 146/74 mmHg     Pulse Rate 06/20/15 2204 58     Resp 06/20/15 2204 20     Temp 06/20/15 2204 97.6 F (36.4 C)     Temp Source 06/20/15 2204 Oral     SpO2 06/20/15 2204 98 %     Weight 06/20/15 2204 197 lb (89.359 kg)     Height 06/20/15 2204 5\' 3"  (1.6 m)     Head Cir --      Peak Flow --      Pain Score 06/20/15 2205 0     Pain Loc --      Pain Edu? --      Excl. in [place]? --      Constitutional: Alert And disoriented Eyes: Conjunctivae are normal. PERRL. Normal  extraocular movements. ENT   Head: Normocephalic and atraumatic.   Nose: No congestion/rhinnorhea.   Mouth/Throat: Mucous membranes are moist.   Neck: No stridor. Hematological/Lymphatic/Immunilogical: No cervical lymphadenopathy. Cardiovascular: Normal rate, regular rhythm. Normal and  symmetric distal pulses are present in all extremities. No murmurs, rubs, or gallops. Respiratory: Normal respiratory effort without tachypnea nor retractions. Breath sounds are clear and equal bilaterally. No wheezes/rales/rhonchi. Gastrointestinal: Soft and nontender. No distention. There is no CVA tenderness. Genitourinary: deferred Musculoskeletal: Nontender with normal range of motion in all extremities. No joint effusions.  No lower extremity tenderness nor edema. Neurologic:  Normal speech and language. No gross focal neurologic deficits are appreciated. Speech is normal.  Skin:  Skin is warm, dry and intact. No rash noted. Psychiatric: Agitated and confused ____________________________________________    LABS (pertinent positives/negatives)  Labs Reviewed  COMPREHENSIVE METABOLIC PANEL - Abnormal; Notable for the following:    Glucose, Bld 102 (*)    Creatinine, Ser 1.11 (*)    GFR calc non Af Amer 49 (*)    GFR calc Af Amer 57 (*)    All other components within normal limits  CBC WITH DIFFERENTIAL/PLATELET - Abnormal; Notable for the following:    WBC 12.2 (*)    Neutro Abs 9.2 (*)    Monocytes Absolute 1.0 (*)    All other components within normal limits  URINALYSIS COMPLETEWITH MICROSCOPIC ([facility] ONLY) - Abnormal; Notable for the following:    Color, Urine AMBER (*)    APPearance HAZY (*)    Hgb urine dipstick 1+ (*)    Nitrite POSITIVE (*)    Leukocytes, UA 2+ (*)    Bacteria, UA FEW (*)    Squamous Epithelial / LPF 0-5 (*)    All other components within normal limits  ETHANOL  URINE DRUG SCREEN, QUALITATIVE ([facility] ONLY)        INITIAL IMPRESSION / ASSESSMENT AND  PLAN / ED COURSE  Pertinent labs & imaging results that were available during my care of the patient were reviewed by me and considered in my medical decision making (see chart for details).  Urine consistent with urinary tract infectionKeflex given. Psychiatry consultation pending  ____________________________________________   FINAL CLINICAL IMPRESSION(S) / ED DIAGNOSES  Final diagnoses:  Dementia, with behavioral disturbance  Acute cystitis without hematuria      Gregor Hams, MD 06/21/15 [phone]

## 2015-06-21 NOTE — ED Notes (Signed)
Patient became agitated with staff and provider upon entrance into treatment room. Patient noted pacing between room and hallway. Patient re-directed back into room without success. Provider made aware of current mental state of patient. Verbal orders received and Kandee Keen, RN placed orders for this RN in chart.

## 2015-06-21 NOTE — Clinical Social Work Note (Signed)
CSW received call from Francine Graven, DSS APS, inquiring where patient was and that patient had been IVC'd by her son and an APS report had been made. Due to there being an active APS investigation, CSW has faxed at [facility] request, the patient's current medical record information for this visit. Mikki Santee stated he would be by to see the patient this morning. Shela Leff MSW,LCSW [phone]

## 2015-06-22 ENCOUNTER — Emergency Department: Payer: Medicare HMO

## 2015-06-22 DIAGNOSIS — I517 Cardiomegaly: Secondary | ICD-10-CM | POA: Diagnosis not present

## 2015-06-22 MED ORDER — RISPERIDONE 0.5 MG PO TBDP
0.5000 mg | ORAL_TABLET | ORAL | Status: AC
  Administered 2015-06-22: 0.5 mg via ORAL
  Filled 2015-06-22: qty 1

## 2015-06-22 MED ORDER — LORAZEPAM 2 MG/ML IJ SOLN: 2.0000 mg | Freq: Once | INTRAMUSCULAR | Status: DC

## 2015-06-22 MED ORDER — LORAZEPAM 2 MG PO TABS
2.0000 mg | ORAL_TABLET | Freq: Once | ORAL | Status: AC
  Administered 2015-06-22: 2 mg via ORAL

## 2015-06-22 MED ORDER — LORAZEPAM 2 MG PO TABS
ORAL_TABLET | ORAL | Status: AC
  Administered 2015-06-22: 2 mg via ORAL
  Filled 2015-06-22: qty 1

## 2015-06-22 MED ORDER — RISPERIDONE 0.5 MG PO TBDP
0.5000 mg | ORAL_TABLET | Freq: Two times a day (BID) | ORAL | Status: DC
  Administered 2015-06-23 – 2015-06-27 (×7): 0.5 mg via ORAL
  Filled 2015-06-22 (×11): qty 1

## 2015-06-22 NOTE — ED Notes (Signed)
MD informed of pt's aggitation; Psych MD paged and new orders entered.

## 2015-06-22 NOTE — Clinical Social Work Note (Signed)
Pt has accepted at [facility]. PASARR has been received. Facility is addressing payment as Medicaid is pending. Facility will be following up with pt's family. CSW will continue to follow.   Darden Dates, MSW, LCSW  Clinical Social Worker  [phone]

## 2015-06-22 NOTE — ED Notes (Signed)
Rep from [facility] and CSW here to visit.

## 2015-06-22 NOTE — ED Provider Notes (Signed)
-----------------------------------------   5:50 PM on 06/22/2015 -----------------------------------------  Was informed the patient is agitated. Valetta Fuller, nurse will page Dr. Weber Cooks so that we can get orders for medication to control patient's agitation as she is required several prn medications for agitation in ER.  Ponciano Ort, MD 06/22/15 [phone]

## 2015-06-22 NOTE — Clinical Social Work Note (Signed)
Clinical Social Work Assessment  Patient Details  Name: Kaitlyn Simmons MRN: [id number] Date of Birth: 06/03/1945  Date of referral:  06/21/15               Reason for consult:  Facility Placement, Discharge Planning, Other (Comment Required) (DSS APS Involvement)                Permission sought to share information with:  Other, Family Supports Permission granted to share information::  Yes, Verbal Permission Granted  Name::     Steve Reichenberger, son   Housing/Transportation Living arrangements for the past 2 months:  Single Family Home Source of Information:  Patient, Adult Children Patient Interpreter Needed:  None Criminal Activity/Legal Involvement Pertinent to Current Situation/Hospitalization:  No - Comment as needed Significant Relationships:  Adult Children, Siblings Lives with:  Siblings Do you feel safe going back to the place where you live?  No Need for family participation in patient care:  Yes (Comment)  Care giving concerns:   Pt has dementia and is only oriented to herself. Pt was found by police after she went missing.   Social Worker assessment / plan:  CSW met with pt and DSS worker for assessment. Pt has a history of dementia and is only oriented to herself. Pt pleasant and cooperative during assessment by APS. Pt was found by police and brought to the ED, at time of assessment, it was noted that pt was under IVC, however pt was not and psych consult was pending. Concerns noted is that pt is unable to take care of herself and wanders.   Per chart review, an FL2 was completed in an attempt to placement pt in March of 2017. However, pt is still at home.   CSW spoke with pt's son, Steve, regarding placement. Pt would be approropriate for a Locked Memory Care Unit (Secure Unit) at an Assisted Living. CSW explained the process of admitted to one. Under the advisment of APS, CSW called [facility] to inquire about availablity at the Memory Care Unit and pricing. CSW updated  pt's son cost. [facility] does have availability for a female at time of assessment. CSW encouraged pt's son to call about a payment, for a down payment as Medicaid is pending.   CSW sent referral to [facility] and updated Bob Cauthren with APS. CSW will continue to follow.   Employment status:  Retired Insurance information:  Managed Medicare PT Recommendations:  Not assessed at this time Information / Referral to community resources:  APS (Comment Required: County, Name & Number of worker spoken with), Other (Comment Required) (Bob Cauthren, [phone]/ALF-Memory Care)  Patient/Family's Response to care:  Pt's son was appreciative of CSW support.   Patient/Family's Understanding of and Emotional Response to Diagnosis, Current Treatment, and Prognosis:  Pt's son understands that pt is unsafe to return home and would like placement for her. Pt is also cooperating with APS for assistance as well.   Emotional Assessment Appearance:  Appears older than stated age Attitude/Demeanor/Rapport:  Guarded Affect (typically observed):  Pleasant Orientation:  Oriented to Self Alcohol / Substance use:  Never Used Psych involvement (Current and /or in the community):  Yes (Comment)  Discharge Needs  Concerns to be addressed:  Adjustment to Illness, Decision making concerns, Discharge Planning Concerns, Home Safety Concerns, Other (Comment Required (APS involvement) Readmission within the last 30 days:  No Current discharge risk:  Cognitively Impaired Barriers to Discharge:  Continued Medical Work up    ,   LCSW 06/22/2015, 9:39 AM

## 2015-06-22 NOTE — ED Notes (Signed)
Pt still aggitated; pt given towels and washclothes to fold. Pt redirected easily.

## 2015-06-22 NOTE — ED Notes (Signed)
Pt laying on stretcher on side. Side rails up for safety. Pt easily arousable when enter room. Care channel on tv for comfort.

## 2015-06-22 NOTE — ED Notes (Signed)
Pt ambulatory to toilet with slow but steady gait with no assistance.

## 2015-06-22 NOTE — ED Provider Notes (Signed)
-----------------------------------------   6:58 AM on 06/22/2015 -----------------------------------------   Blood pressure 118/76, pulse 52, temperature 98 F (36.7 C), temperature source Oral, resp. rate 16, height 5\' 3"  (1.6 m), weight 89.359 kg, SpO2 94 %.  The patient had no acute events since last update.  Calm and cooperative at this time.  Disposition plans are still pending.     Hinda Kehr, MD 06/22/15 [phone]

## 2015-06-22 NOTE — NC FL2 (Deleted)
[place] LEVEL OF CARE SCREENING TOOL     IDENTIFICATION  Patient Name: Kaitlyn Simmons Birthdate: Dec 24, 1945 Sex: female Admission Date (Current Location): 06/20/2015  [place] and [place] Number:  Engineering geologist and Address:  [facility], [address] [place], [place] [address]      Provider Number: [id number]  Attending Physician Name and Address:  No att. providers found  Relative Name and Phone Number:       Current Level of Care: Hospital Recommended Level of Care: Memory Care, Other (Comment) (Secure Unit) Prior Approval Number:    Date Approved/Denied:   PASRR Number: [id number]  Discharge Plan: Other (Comment) (Memory Care-Secure Unit)    Current Diagnoses: Patient Active Problem List   Diagnosis Date Noted  . Dementia with behavioral disturbance 06/21/2015  . Dementia 03/29/2015  . Body mass index 40.0-44.9, adult ([place]) 12/17/2014  . Morbid obesity ([place]) 12/17/2014  . Depression 09/17/2014  . Calculus of bile duct without mention of cholecystitis or obstruction 10/08/2012  . Abnormal cholangiogram 10/08/2012  . Mucosal abnormality of duodenum 10/08/2012  . Abnormal LFTs (liver function tests) 09/12/2012  . Abdominal pain, epigastric 09/12/2012  . Non-STEMI (non-ST elevated myocardial infarction) ([place]) 09/11/2012  . CAD (coronary artery disease)   . Myocardial infarction ([place])   . Coronary artery disease   . CHF (congestive heart failure) ([place])   . HTN (hypertension)   . Hyperlipidemia   . Ischemic cardiomyopathy     Orientation RESPIRATION BLADDER Height & Weight     Self  Normal Continent Weight: 197 lb (89.359 kg) Height:  5\' 3"  (160 cm)  BEHAVIORAL SYMPTOMS/MOOD NEUROLOGICAL BOWEL NUTRITION STATUS  [place]   Continent Diet (Regular Diet)  AMBULATORY STATUS COMMUNICATION OF NEEDS Skin   Supervision Verbally Normal                       Personal Care Assistance Level of Assistance   Bathing, Feeding, Total care, Dressing Bathing Assistance: Limited assistance Feeding assistance: Independent Dressing Assistance: Limited assistance     Functional Limitations Info  Sight, Hearing, Speech Sight Info: Adequate Hearing Info: Adequate Speech Info: Adequate    SPECIAL CARE FACTORS FREQUENCY  PT (By licensed PT)     PT Frequency: 5              Contractures      Additional Factors Info  Code Status, Allergies Code Status Info: Full Code Allergies Info: No known allergies           Current Medications (06/22/2015):  This is the current hospital active medication list Current Facility-Administered Medications  Medication Dose Route Frequency Provider Last Rate Last Dose  . aspirin EC tablet 81 mg  81 mg Oral Daily Gonzella Lex, MD   81 mg at 06/22/15 1048  . atorvastatin (LIPITOR) tablet 80 mg  80 mg Oral q1800 Gonzella Lex, MD   80 mg at 06/21/15 2029  . carvedilol (COREG) tablet 12.5 mg  12.5 mg Oral BID WC Gonzella Lex, MD      . cephALEXin (KEFLEX) capsule 500 mg  500 mg Oral Q12H Gregor Hams, MD   500 mg at 06/22/15 1047  . clopidogrel (PLAVIX) tablet 75 mg  75 mg Oral Daily Gonzella Lex, MD   75 mg at 06/22/15 1047  . FLUoxetine (PROZAC) capsule 20 mg  20 mg Oral Daily Gonzella Lex, MD   20 mg at 06/22/15 1047  .  furosemide (LASIX) tablet 40 mg  40 mg Oral Daily Gonzella Lex, MD   40 mg at 06/22/15 1048  . lisinopril (PRINIVIL,ZESTRIL) tablet 2.5 mg  2.5 mg Oral Daily Sylvan Cheese, MD      . nitroGLYCERIN (NITROSTAT) SL tablet 0.4 mg  0.4 mg Sublingual Q5 min PRN Gonzella Lex, MD      . risperiDONE (RISPERDAL M-TABS) disintegrating tablet 0.25 mg  0.25 mg Oral Q6H PRN Gonzella Lex, MD       Current Outpatient Prescriptions  Medication Sig Dispense Refill  . aspirin EC 81 MG tablet Take 81 mg by mouth as needed.     Marland Kitchen atorvastatin (LIPITOR) 80 MG tablet Take 1 tablet (80 mg total) by mouth every morning. (Patient taking  differently: Take 80 mg by mouth at bedtime. ) 30 tablet 12  . carvedilol (COREG) 25 MG tablet Take 1/2 tablet twice a day. 60 tablet 6  . clopidogrel (PLAVIX) 75 MG tablet Take 1 tablet (75 mg total) by mouth daily. 30 tablet 12  . FLUoxetine (PROZAC) 20 MG capsule TAKE 1 CAPSULE BY MOUTH ONCE EVERY MORNING 30 capsule 6  . furosemide (LASIX) 40 MG tablet Take 1 tablet (40 mg total) by mouth every morning. 30 tablet 12  . lisinopril (ZESTRIL) 2.5 MG tablet Take 1 tablet (2.5 mg total) by mouth daily. 30 tablet 6  . nitroGLYCERIN (NITROSTAT) 0.4 MG SL tablet Place 0.4 mg under the tongue every 5 (five) minutes as needed for chest pain.       Discharge Medications: Please see discharge summary for a list of discharge medications.  Relevant Imaging Results:  Relevant Lab Results:   Additional Information SSN:  [id number]  Darden Dates, LCSW

## 2015-06-22 NOTE — Consult Note (Signed)
[facility] Consult   Reason for Consult:  Consult for this 70 year old woman with a history of multiple medical problems and dementia who was brought in by law enforcement this morning after being found lost in the community Referring Physician:  Owens Shark Patient Identification: Kaitlyn Simmons MRN:  [id number] Principal Diagnosis: Dementia with behavioral disturbance Diagnosis:   Patient Active Problem List   Diagnosis Date Noted  . Dementia with behavioral disturbance [F03.91] 06/21/2015  . Dementia [F03.90] 03/29/2015  . Body mass index 40.0-44.9, adult ([place]) [Z68.41] 12/17/2014  . Morbid obesity ([place]) [E66.01] 12/17/2014  . Depression [F32.9] 09/17/2014  . Calculus of bile duct without mention of cholecystitis or obstruction [K80.50] 10/08/2012  . Abnormal cholangiogram [R93.2] 10/08/2012  . Mucosal abnormality of duodenum [K31.9] 10/08/2012  . Abnormal LFTs (liver function tests) [R79.89] 09/12/2012  . Abdominal pain, epigastric [R10.13] 09/12/2012  . Non-STEMI (non-ST elevated myocardial infarction) ([place]) [I21.4] 09/11/2012  . CAD (coronary artery disease) [I25.10]   . Myocardial infarction ([place]) [I21.3]   . Coronary artery disease [I25.10]   . CHF (congestive heart failure) ([place]) [I50.9]   . HTN (hypertension) [I10]   . Hyperlipidemia [E78.5]   . Ischemic cardiomyopathy [I25.5]     Total Time spent with patient: 20 minutes  Subjective:   Kaitlyn Simmons is a 70 y.o. female patient admitted with "I don't know".  Patient with dementia. Wandering behavior. No new complaints today. Vital signs stable. No change to physical exam.  HPI:  Patient interviewed. Chart reviewed. Labs and vitals reviewed. Case discussed with social work and ER physician. 70 year old woman was brought in by Event organiser. The report was that she had wandered away from her place of living and was found hiding in a building somewhere. Patient on interview this morning could not remember  anything about how she came to be in the hospital. She was not able in fact to tell me where she was currently nor was she able to tell me the correct year or anything about her current situation. She denied any specific physical complaints denying pain and nausea and shortness of breath chest pain or fatigue. She denied having auditory or visual hallucinations. Denied any suicidal or homicidal ideation. Patient has no idea whether she is on medication at baseline and has no idea whether she has been taking them. She is an unreliable historian as far as her recent living situation and where she has been recently or anything about recent events. After she was brought into the emergency room it sounds like adult protective services became involved and are investigating. At this point it still remains unclear to me where she had actually been living.  Past psychiatric history: Other than a documented history of dementia that has been progressive since she had a heart attack there is no known past psychiatric history. No history of suicidal or violent behavior.  Medical history: History of multiple medical problems including hypertension and hyperlipidemia history of myocardial infarction and coronary artery disease, history of congestive heart failure.  Substance abuse history: Patient denies any lifetime use of alcohol or drugs or any substance abuse problems  Past Psychiatric History: Prozac is listed as being one of her medicine although there is nothing in her chart to suggest a full blown diagnosis of depression. It was mentioned in passing by some of her doctors. No history of psychiatric hospitalization no history of suicidal or aggressive behavior. No known history of inpatient treatment.  Risk to Self: Is patient at risk  for suicide?: No Risk to Others:   Prior Inpatient Therapy:   Prior Outpatient Therapy:    Past Medical History:  Past Medical History  Diagnosis Date  . CAD (coronary artery  disease)     a. s/p [place] STEMI 7/11 => tx with BMS to RCA (tx at [facility]);  b.  AL STEMI 7/12 => LHC: mRCA 100% ==> PCI with BMS to RCA; LM 10%, pCFX 50%, OM1 10%, pLAD 50%, mLAD 99%, 70%  ==>  c. staged PCI: BMS to  LAD (tx at [facility])  . Chronic systolic heart failure ([place])   . HTN (hypertension)   . Hyperlipidemia   . Ischemic cardiomyopathy     a. EF 30-35% at time of AL STEMI 7/12;   b. IMPROVED by f/u Echo 9/13:  mild LVH, EF 45-50%, mid to dist Inf, AS, Apical HK, Gr 1 diast dysfn, MAC, mod LAE  . Cataracts, bilateral   . CHF (congestive heart failure) ([place])   . GERD (gastroesophageal reflux disease)   . STEMI (ST elevation myocardial infarction) ([place]) 2011; 2012    [place]; [place]  . Poor short-term memory   . Dementia     Past Surgical History  Procedure Laterality Date  . Tonsillectomy and adenoidectomy  1980's  . Coronary angioplasty with stent placement  2011; 2012    RCA; RCA & LAD  . Dilation and curettage of uterus  1970's  . Cataract extraction w/ intraocular lens implant Left ~ 2008  . Cholecystectomy N/A 10/07/2012    Procedure: LAPAROSCOPIC CHOLECYSTECTOMY WITH INTRAOPERATIVE CHOLANGIOGRAM;  Surgeon: Shann Medal, MD;  Location: [facility] ORS;  Service: General;  Laterality: N/A;  . Ercp N/A 10/08/2012    Procedure: ENDOSCOPIC RETROGRADE CHOLANGIOPANCREATOGRAPHY (ERCP);  Surgeon: Gatha Mayer, MD;  Location: [facility];  Service: Endoscopy;  Laterality: N/A;  . Ercp w/ sphicterotomy  11/10/2012    stones removed [place]   Family History:  Family History  Problem Relation Age of Onset  . Heart attack Father     DECEASED  . Stroke Father     DECEASED  . Leukemia Mother     DECEASED  . Heart attack Sister     ALIVE  . Hypertension Sister    Family Psychiatric  History: Patient was not able to give any history about family history despite my asking. Social History:  History  Alcohol Use No     History  Drug Use No    Social History   Social History  . Marital Status:  Single    Spouse Name: N/A  . Number of Children: N/A  . Years of Education: N/A   Social History Main Topics  . Smoking status: Former Smoker -- 2.00 packs/day for 30 years    Types: Cigarettes    Quit date: 10/25/2002  . Smokeless tobacco: Never Used  . Alcohol Use: No  . Drug Use: No  . Sexual Activity: No   Other Topics Concern  . None   Social History Narrative   Additional Social History:    Allergies:  No Known Allergies  Labs:  Results for orders placed or performed during the hospital encounter of 06/20/15 (from the past 48 hour(s))  Comprehensive metabolic panel     Status: Abnormal   Collection Time: 06/20/15 10:09 PM  Result Value Ref Range   Sodium 140 135 - 145 mmol/L   Potassium 4.0 3.5 - 5.1 mmol/L   Chloride 107 101 - 111 mmol/L   CO2 26 22 -  32 mmol/L   Glucose, Bld 102 (H) 65 - 99 mg/dL   BUN 18 6 - 20 mg/dL   Creatinine, Ser 1.11 (H) 0.44 - 1.00 mg/dL   Calcium 9.6 8.9 - 10.3 mg/dL   Total Protein 7.6 6.5 - 8.1 g/dL   Albumin 4.2 3.5 - 5.0 g/dL   AST 22 15 - 41 U/L   ALT 14 14 - 54 U/L   Alkaline Phosphatase 70 38 - 126 U/L   Total Bilirubin 0.8 0.3 - 1.2 mg/dL   GFR calc non Af Amer 49 (L) >60 mL/min   GFR calc Af Amer 57 (L) >60 mL/min    Comment: (NOTE) The eGFR has been calculated using the CKD EPI equation. This calculation has not been validated in all clinical situations. eGFR's persistently <60 mL/min signify possible Chronic Kidney Disease.    Anion gap 7 5 - 15  Ethanol     Status: None   Collection Time: 06/20/15 10:09 PM  Result Value Ref Range   Alcohol, Ethyl (B) <5 <5 mg/dL    Comment:        LOWEST DETECTABLE LIMIT FOR SERUM ALCOHOL IS 5 mg/dL FOR MEDICAL PURPOSES ONLY   CBC with Diff     Status: Abnormal   Collection Time: 06/20/15 10:09 PM  Result Value Ref Range   WBC 12.2 (H) 3.6 - 11.0 K/uL   RBC 4.46 3.80 - 5.20 MIL/uL   Hemoglobin 13.2 12.0 - 16.0 g/dL   HCT 40.0 35.0 - 47.0 %   MCV 89.6 80.0 - 100.0 fL    MCH 29.7 26.0 - 34.0 pg   MCHC 33.1 32.0 - 36.0 g/dL   RDW 14.5 11.5 - 14.5 %   Platelets 249 150 - 440 K/uL   Neutrophils Relative % 75 %   Neutro Abs 9.2 (H) 1.4 - 6.5 K/uL   Lymphocytes Relative 14 %   Lymphs Abs 1.7 1.0 - 3.6 K/uL   Monocytes Relative 8 %   Monocytes Absolute 1.0 (H) 0.2 - 0.9 K/uL   Eosinophils Relative 2 %   Eosinophils Absolute 0.2 0 - 0.7 K/uL   Basophils Relative 1 %   Basophils Absolute 0.1 0 - 0.1 K/uL  Urine Drug Screen, Qualitative ([facility] only)     Status: None   Collection Time: 06/20/15 10:09 PM  Result Value Ref Range   Tricyclic, Ur Screen NONE DETECTED NONE DETECTED   Amphetamines, Ur Screen NONE DETECTED NONE DETECTED   MDMA (Ecstasy)Ur Screen NONE DETECTED NONE DETECTED   Cocaine Metabolite,Ur [place] NONE DETECTED NONE DETECTED   Opiate, Ur Screen NONE DETECTED NONE DETECTED   Phencyclidine (PCP) Ur S NONE DETECTED NONE DETECTED   Cannabinoid 50 Ng, Ur  NONE DETECTED NONE DETECTED   Barbiturates, Ur Screen NONE DETECTED NONE DETECTED   Benzodiazepine, Ur Scrn NONE DETECTED NONE DETECTED   Methadone Scn, Ur NONE DETECTED NONE DETECTED    Comment: (NOTE) 161  Tricyclics, urine               Cutoff 1000 ng/mL 200  Amphetamines, urine             Cutoff 1000 ng/mL 300  MDMA (Ecstasy), urine           Cutoff 500 ng/mL 400  Cocaine Metabolite, urine       Cutoff 300 ng/mL 500  Opiate, urine                   Cutoff 300 ng/mL  600  Phencyclidine (PCP), urine      Cutoff 25 ng/mL 700  Cannabinoid, urine              Cutoff 50 ng/mL 800  Barbiturates, urine             Cutoff 200 ng/mL 900  Benzodiazepine, urine           Cutoff 200 ng/mL 1000 Methadone, urine                Cutoff 300 ng/mL 1100 1200 The urine drug screen provides only a preliminary, unconfirmed 1300 analytical test result and should not be used for non-medical 1400 purposes. Clinical consideration and professional judgment should 1500 be applied to any positive drug screen result  due to possible 1600 interfering substances. A more specific alternate chemical method 1700 must be used in order to obtain a confirmed analytical result.  1800 Gas chromato graphy / mass spectrometry (GC/MS) is the preferred 1900 confirmatory method.   Urinalysis complete, with microscopic ([facility] only)     Status: Abnormal   Collection Time: 06/20/15 10:09 PM  Result Value Ref Range   Color, Urine AMBER (A) YELLOW   APPearance HAZY (A) CLEAR   Glucose, UA NEGATIVE NEGATIVE mg/dL   Bilirubin Urine NEGATIVE NEGATIVE   Ketones, ur NEGATIVE NEGATIVE mg/dL   Specific Gravity, Urine 1.023 1.005 - 1.030   Hgb urine dipstick 1+ (A) NEGATIVE   pH 5.0 5.0 - 8.0   Protein, ur NEGATIVE NEGATIVE mg/dL   Nitrite POSITIVE (A) NEGATIVE   Leukocytes, UA 2+ (A) NEGATIVE   RBC / HPF 0-5 0 - 5 RBC/hpf   WBC, UA 6-30 0 - 5 WBC/hpf   Bacteria, UA FEW (A) NONE SEEN   Squamous Epithelial / LPF 0-5 (A) NONE SEEN   Mucous PRESENT    Hyaline Casts, UA PRESENT     Current Facility-Administered Medications  Medication Dose Route Frequency Provider Last Rate Last Dose  . aspirin EC tablet 81 mg  81 mg Oral Daily Gonzella Lex, MD   81 mg at 06/22/15 1048  . atorvastatin (LIPITOR) tablet 80 mg  80 mg Oral q1800 Gonzella Lex, MD   80 mg at 06/21/15 2029  . carvedilol (COREG) tablet 12.5 mg  12.5 mg Oral BID WC Gonzella Lex, MD      . cephALEXin (KEFLEX) capsule 500 mg  500 mg Oral Q12H Gregor Hams, MD   500 mg at 06/22/15 1047  . clopidogrel (PLAVIX) tablet 75 mg  75 mg Oral Daily Gonzella Lex, MD   75 mg at 06/22/15 1047  . FLUoxetine (PROZAC) capsule 20 mg  20 mg Oral Daily Gonzella Lex, MD   20 mg at 06/22/15 1047  . furosemide (LASIX) tablet 40 mg  40 mg Oral Daily Gonzella Lex, MD   40 mg at 06/22/15 1048  . lisinopril (PRINIVIL,ZESTRIL) tablet 2.5 mg  2.5 mg Oral Daily Sylvan Cheese, MD      . nitroGLYCERIN (NITROSTAT) SL tablet 0.4 mg  0.4 mg Sublingual Q5 min PRN Gonzella Lex, MD       . risperiDONE (RISPERDAL M-TABS) disintegrating tablet 0.25 mg  0.25 mg Oral Q6H PRN Gonzella Lex, MD      . Derrill Memo ON 06/23/2015] risperiDONE (RISPERDAL M-TABS) disintegrating tablet 0.5 mg  0.5 mg Oral BID AC Gonzella Lex, MD       Current Outpatient Prescriptions  Medication Sig Dispense Refill  .  aspirin EC 81 MG tablet Take 81 mg by mouth as needed.     Marland Kitchen atorvastatin (LIPITOR) 80 MG tablet Take 1 tablet (80 mg total) by mouth every morning. (Patient taking differently: Take 80 mg by mouth at bedtime. ) 30 tablet 12  . carvedilol (COREG) 25 MG tablet Take 1/2 tablet twice a day. 60 tablet 6  . clopidogrel (PLAVIX) 75 MG tablet Take 1 tablet (75 mg total) by mouth daily. 30 tablet 12  . FLUoxetine (PROZAC) 20 MG capsule TAKE 1 CAPSULE BY MOUTH ONCE EVERY MORNING 30 capsule 6  . furosemide (LASIX) 40 MG tablet Take 1 tablet (40 mg total) by mouth every morning. 30 tablet 12  . lisinopril (ZESTRIL) 2.5 MG tablet Take 1 tablet (2.5 mg total) by mouth daily. 30 tablet 6  . nitroGLYCERIN (NITROSTAT) 0.4 MG SL tablet Place 0.4 mg under the tongue every 5 (five) minutes as needed for chest pain.      Musculoskeletal: Strength & Muscle Tone: decreased Gait & Station: unsteady Patient leans: N/A  Psychiatric Specialty Exam: Review of Systems  Constitutional: Negative.   HENT: Negative.   Eyes: Negative.   Respiratory: Negative.   Cardiovascular: Negative.   Gastrointestinal: Negative.   Musculoskeletal: Negative.   Skin: Negative.   Neurological: Negative.   Psychiatric/Behavioral: Positive for memory loss. Negative for depression, suicidal ideas, hallucinations and substance abuse. The patient is not nervous/anxious and does not have insomnia.     Blood pressure 131/77, pulse 79, temperature 98.1 F (36.7 C), temperature source Oral, resp. rate 18, height '5\' 3"'$  (1.6 m), weight 89.359 kg (197 lb), SpO2 99 %.Body mass index is 34.91 kg/(m^2).  General Appearance: Disheveled  Eye  Sport and exercise psychologist::  Fair  Speech:  Slow  Volume:  Decreased  Mood:  Euthymic  Affect:  Constricted  Thought Process:  Disorganized  Orientation:  Negative  Thought Content:  Confusion. Very demented  Suicidal Thoughts:  No  Homicidal Thoughts:  No  Memory:  Very poor. Couldn't even repeat 1 out of 3 objects after several attempts to get her to repeat.  Judgement:  Impaired  Insight:  Lacking  Psychomotor Activity:  Decreased  Concentration:  Poor  Recall:  Poor  Fund of Knowledge:Poor  Language: Poor  Akathisia:  No  Handed:  Right  AIMS (if indicated):     Assets:  Social Support  ADL's:  Impaired  Cognition: Impaired,  Moderate and Severe  Sleep:      Treatment Plan Summary: Medication management and Plan 70 year old woman with dementia. She has had some problems with trying to wander away and a little bit of irritability but has not been violent or aggressive. I have talked about the plan with nursing and have tried to put her on more standing antipsychotics during the day to prevent sundowning. Social work has arranged for admission to [facility]. She will hopefully be admitted within the next day or so. No other change to treatment today.  Disposition: Patient does not meet criteria for psychiatric inpatient admission. Supportive therapy provided about ongoing stressors.  Alethia Berthold, MD 06/22/2015 5:55 PM

## 2015-06-23 MED ORDER — TUBERCULIN PPD 5 UNIT/0.1ML ID SOLN
5.0000 [IU] | Freq: Once | INTRADERMAL | Status: AC
  Administered 2015-06-23: 5 [IU] via INTRADERMAL
  Filled 2015-06-23 (×2): qty 0.1

## 2015-06-23 MED ORDER — LORAZEPAM 2 MG PO TABS: 2.0000 mg | ORAL_TABLET | Freq: Once | ORAL | Status: DC

## 2015-06-23 NOTE — Consult Note (Signed)
  Psychiatry: 70 year old woman with dementia and behavioral disturbance. No change to presentation today. No change to behavior. Continues to be mostly withdrawn. Not agitated as much today. Vitals are stable although blood pressure is a little bit low. I had been told that the patient was accepted at [facility] and that discharge was pending. I am unclear why she is still here in the hospital. Psychiatrically stable no need at this point for any change to treatment. Patient requires placement in a locked facility.

## 2015-06-23 NOTE — ED Notes (Signed)
BEHAVIORAL HEALTH ROUNDING Patient sleeping: Yes.   Patient alert and oriented: eyes closed  Appears to be asleep Behavior appropriate: Yes.  ; If no, describe:  Nutrition and fluids offered: sleeping Toileting and hygiene offered: sleeping Sitter present: q 15 minute observations and security monitoring Law enforcement present: yes  ODS 

## 2015-06-23 NOTE — ED Notes (Signed)
Am meds administered as ordered - delayed due to pt sleeping peacefully earlier this am  Assessment completed  She denies pain  Activity apron provided  Continue to monitor

## 2015-06-23 NOTE — ED Notes (Signed)
BEHAVIORAL HEALTH ROUNDING Patient sleeping: No. Patient alert and oriented: yes Behavior appropriate: Yes.  ; If no, describe:  Nutrition and fluids offered: yes Toileting and hygiene offered: Yes  Sitter present: q15 minute observations and security  monitoring Law enforcement present: Yes  ODS  

## 2015-06-23 NOTE — ED Notes (Signed)
Pt given lunch tray.

## 2015-06-23 NOTE — ED Notes (Signed)
Pt attempted to walk out of quad, ODS  [place] and [place] stopped pt and moved back to room, pt c/o pain at bruised area of left upper arm, pt distracted with towels and apron by this RN, pt calm ATT

## 2015-06-23 NOTE — Clinical Social Work Note (Signed)
CSW is continuing to follow for discharge planning to a Memory Care. Currently awaiting to her that pt's special assistance for payment to facility. Pt's son inquired about placement at a facility in [place], which would be more convenient for the family. CSW updated [facility] on this matter. CSW will ask for a PPD as Stephan Minister will require it. CSW will continue to follow.   Darden Dates, MSW, LCSW  Clinical Social Worker  [phone]

## 2015-06-23 NOTE — ED Provider Notes (Signed)
-----------------------------------------   8:03 AM on 06/23/2015 -----------------------------------------   Blood pressure 99/59, pulse 60, temperature 97.7 F (36.5 C), temperature source Oral, resp. rate 18, height 5\' 3"  (1.6 m), weight 197 lb (89.359 kg), SpO2 97 %.  The patient had no acute events since last update.  Calm and cooperative at this time.  Disposition is pending per Psychiatry/Behavioral Medicine team recommendations.  Patient remains medically stable. She is receiving Keflex twice a day for urinary tract infection. I'll add a urine culture as well to confirm.     Carrie Mew, MD 06/23/15 [phone]

## 2015-06-23 NOTE — ED Notes (Signed)
BEHAVIORAL HEALTH ROUNDING Patient sleeping: Yes.   Patient alert and oriented: eyes closed  Appears to be asleep Behavior appropriate: Yes.  ; If no, describe:  Nutrition and fluids offered: asleep Toileting and hygiene offered: sleeping Sitter present: q 15 minute observations and security monitoring Law enforcement present: yes  ODS

## 2015-06-23 NOTE — ED Notes (Signed)
PPD att per order: "Darden Dates, MSW, LCSW, Clinical Social Worker, [phone], CSW will ask for a PPD as Stephan Minister will require it. CSW will continue to follow."

## 2015-06-23 NOTE — ED Notes (Signed)
Breakfast tray placed at bedside. Pt sleeping 

## 2015-06-23 NOTE — ED Notes (Signed)
Patient observed lying in bed with eyes closed  Even, unlabored respirations observed   NAD pt appears to be sleeping  I will continue to monitor along with every 15 minute visual observations and ongoing security monitoring    

## 2015-06-23 NOTE — ED Notes (Signed)
ED BHU [facility] Is the patient under IVC or is there intent for IVC: Yes.   Is the patient medically cleared: Yes.   Is there vacancy in the ED BHU:  no Is the population mix appropriate for patient: no dementia  Is the patient awaiting placement in inpatient or outpatient setting: Yes.  Long term placement Has the patient had a psychiatric consult: Yes.   Survey of unit performed for contraband, proper placement and condition of furniture, tampering with fixtures in bathroom, shower, and each patient room: Yes.  ; Findings:  APPEARANCE/BEHAVIOR Calm and cooperative NEURO ASSESSMENT Orientation: oriented to self   Denies pain Hallucinations: No.None noted (Hallucinations) Speech: Normal Gait: normal RESPIRATORY ASSESSMENT Even  Unlabored respirations  CARDIOVASCULAR ASSESSMENT Pulses equal   regular rate  Skin warm and dry   GASTROINTESTINAL ASSESSMENT no GI complaint EXTREMITIES Full ROM  PLAN OF CARE Provide calm/safe environment. Vital signs assessed twice daily. ED BHU Assessment once each 12-hour shift. Collaborate with intake RN daily or as condition indicates. Assure the ED provider has rounded once each shift. Provide and encourage hygiene. Provide redirection as needed. Assess for escalating behavior; address immediately and inform ED provider.  Assess family dynamic and appropriateness for visitation as needed: Yes.  ; If necessary, describe findings:  Educate the patient/family about BHU procedures/visitation: Yes.  ; If necessary, describe findings:

## 2015-06-23 NOTE — ED Notes (Addendum)
BEHAVIORAL HEALTH ROUNDING Patient sleeping: Yes.   Patient alert and oriented: eyes closed  Appears to be sleeping Behavior appropriate: Yes.  ; If no, describe:  Nutrition and fluids offered: sleeping Toileting and hygiene offered: sleeping Sitter present: q 15 minute observations and security  monitoring Law enforcement present: yes  ODS  ENVIRONMENTAL ASSESSMENT Potentially harmful objects out of patient reach: Yes.   Personal belongings secured: Yes.   Patient dressed in hospital provided attire only: Yes.   Plastic bags out of patient reach: Yes.   Patient care equipment (cords, cables, call bells, lines, and drains) shortened, removed, or accounted for: Yes.   Equipment and supplies removed from bottom of stretcher: Yes.   Potentially toxic materials out of patient reach: Yes.   Sharps container removed or out of patient reach: Yes.

## 2015-06-23 NOTE — ED Provider Notes (Signed)
-----------------------------------------   6:36 PM on 06/23/2015 -----------------------------------------  Patient apparently has become more agitated in the evenings and is doing this again. She is threatening staff and trying to kick them. We will give her mild sedative medication to see if we can relieve her discomfort  Schuyler Amor, MD 06/23/15 [phone]

## 2015-06-24 MED ORDER — ACETAMINOPHEN 325 MG PO TABS
650.0000 mg | ORAL_TABLET | Freq: Once | ORAL | Status: AC
  Administered 2015-06-24: 650 mg via ORAL

## 2015-06-24 MED ORDER — ACETAMINOPHEN 325 MG PO TABS
ORAL_TABLET | ORAL | Status: AC
  Administered 2015-06-24: 650 mg via ORAL
  Filled 2015-06-24: qty 2

## 2015-06-24 NOTE — Clinical Social Work Note (Signed)
Pt spoke with Francine Graven with [facility] and pt's family completed the Special Assistance application needed for placement.   PPD placed on Thursday, and it will be read on Saturday.   Pt will discharge to [facility] on Monday. Facility will only be able to accept pt after PPD is read and unable to accept pt over weekend. CSW will continue to follow.   Darden Dates, MSW, LCSW  Clinical Social Worker  [phone]

## 2015-06-24 NOTE — ED Notes (Signed)
Patient stated she had not had supper so I provided a sandwich bag. Patient appreciative

## 2015-06-24 NOTE — ED Notes (Signed)
Patient offered snack but didn't want one

## 2015-06-24 NOTE — ED Notes (Signed)
Assisted patient with a complete bed bath including hair and changed bed linens. Had EVS to clean patients floor in room

## 2015-06-24 NOTE — ED Provider Notes (Signed)
-----------------------------------------   6:40 AM on 06/24/2015 -----------------------------------------   Blood pressure 95/51, pulse 62, temperature 98.4 F (36.9 C), temperature source Oral, resp. rate 15, height 5\' 3"  (1.6 m), weight 197 lb (89.359 kg), SpO2 98 %.  The patient had no acute events since last update.  Calm and cooperative at this time.  Disposition is pending per Psychiatry/Behavioral Medicine team recommendations.     Paulette Blanch, MD 06/24/15 [phone]

## 2015-06-24 NOTE — NC FL2 (Deleted)
[place] LEVEL OF CARE SCREENING TOOL     IDENTIFICATION  Patient Name: Kaitlyn Simmons Birthdate: Jan 15, 1946 Sex: female Admission Date (Current Location): 06/20/2015  [place] and [place] Number:  Engineering geologist and Address:  [facility], [address], [place], [place] [address]      Provider Number: [id number]  Attending Physician Name and Address:  No att. providers found  Relative Name and Phone Number:       Current Level of Care: Hospital Recommended Level of Care: Memory Care, Other (Comment) (Secure Unit) Prior Approval Number:    Date Approved/Denied:   PASRR Number: [id number]  Discharge Plan: Other (Comment) (Memory Care-Secure Unit)    Current Diagnoses: Patient Active Problem List   Diagnosis Date Noted  . Dementia with behavioral disturbance 06/21/2015  . Dementia 03/29/2015  . Body mass index 40.0-44.9, adult ([place]) 12/17/2014  . Morbid obesity ([place]) 12/17/2014  . Depression 09/17/2014  . Calculus of bile duct without mention of cholecystitis or obstruction 10/08/2012  . Abnormal cholangiogram 10/08/2012  . Mucosal abnormality of duodenum 10/08/2012  . Abnormal LFTs (liver function tests) 09/12/2012  . Abdominal pain, epigastric 09/12/2012  . Non-STEMI (non-ST elevated myocardial infarction) ([place]) 09/11/2012  . CAD (coronary artery disease)   . Myocardial infarction ([place])   . Coronary artery disease   . CHF (congestive heart failure) ([place])   . HTN (hypertension)   . Hyperlipidemia   . Ischemic cardiomyopathy     Orientation RESPIRATION BLADDER Height & Weight     Self  Normal Continent Weight: 197 lb (89.359 kg) Height:  5\' 3"  (160 cm)  BEHAVIORAL SYMPTOMS/MOOD NEUROLOGICAL BOWEL NUTRITION STATUS  Wanderer   Continent Diet (Regular Diet)  AMBULATORY STATUS COMMUNICATION OF NEEDS Skin   Supervision Verbally Normal                       Personal Care Assistance Level of Assistance   Bathing, Feeding, Total care, Dressing Bathing Assistance: Limited assistance Feeding assistance: Independent Dressing Assistance: Limited assistance     Functional Limitations Info  Sight, Hearing, Speech Sight Info: Adequate Hearing Info: Adequate Speech Info: Adequate    SPECIAL CARE FACTORS FREQUENCY  PT (By licensed PT)     PT Frequency: 5              Contractures      Additional Factors Info  Code Status, Allergies Code Status Info: Full Code Allergies Info: No known allergies           Current Medications (06/24/2015):  This is the current hospital active medication list Current Facility-Administered Medications  Medication Dose Route Frequency Provider Last Rate Last Dose  . aspirin EC tablet 81 mg  81 mg Oral Daily Gonzella Lex, MD   81 mg at 06/24/15 1004  . atorvastatin (LIPITOR) tablet 80 mg  80 mg Oral q1800 Gonzella Lex, MD   80 mg at 06/22/15 1807  . carvedilol (COREG) tablet 12.5 mg  12.5 mg Oral BID WC Gonzella Lex, MD   12.5 mg at 06/24/15 1003  . cephALEXin (KEFLEX) capsule 500 mg  500 mg Oral Q12H Gregor Hams, MD   500 mg at 06/24/15 1002  . clopidogrel (PLAVIX) tablet 75 mg  75 mg Oral Daily Gonzella Lex, MD   75 mg at 06/24/15 1002  . FLUoxetine (PROZAC) capsule 20 mg  20 mg Oral Daily Gonzella Lex, MD   20 mg  at 06/24/15 1002  . furosemide (LASIX) tablet 40 mg  40 mg Oral Daily Gonzella Lex, MD   40 mg at 06/24/15 1004  . lisinopril (PRINIVIL,ZESTRIL) tablet 2.5 mg  2.5 mg Oral Daily Sylvan Cheese, MD   2.5 mg at 06/24/15 1005  . nitroGLYCERIN (NITROSTAT) SL tablet 0.4 mg  0.4 mg Sublingual Q5 min PRN Gonzella Lex, MD      . risperiDONE (RISPERDAL M-TABS) disintegrating tablet 0.25 mg  0.25 mg Oral Q6H PRN Gonzella Lex, MD      . risperiDONE (RISPERDAL M-TABS) disintegrating tablet 0.5 mg  0.5 mg Oral BID AC Gonzella Lex, MD   0.5 mg at 06/23/15 1840  . tuberculin injection 5 Units  5 Units Intradermal Once Carrie Mew, MD   5 Units at 06/23/15 2105   Current Outpatient Prescriptions  Medication Sig Dispense Refill  . aspirin EC 81 MG tablet Take 81 mg by mouth as needed.     Marland Kitchen atorvastatin (LIPITOR) 80 MG tablet Take 1 tablet (80 mg total) by mouth every morning. (Patient taking differently: Take 80 mg by mouth at bedtime. ) 30 tablet 12  . carvedilol (COREG) 25 MG tablet Take 1/2 tablet twice a day. 60 tablet 6  . clopidogrel (PLAVIX) 75 MG tablet Take 1 tablet (75 mg total) by mouth daily. 30 tablet 12  . FLUoxetine (PROZAC) 20 MG capsule TAKE 1 CAPSULE BY MOUTH ONCE EVERY MORNING 30 capsule 6  . furosemide (LASIX) 40 MG tablet Take 1 tablet (40 mg total) by mouth every morning. 30 tablet 12  . lisinopril (ZESTRIL) 2.5 MG tablet Take 1 tablet (2.5 mg total) by mouth daily. 30 tablet 6  . nitroGLYCERIN (NITROSTAT) 0.4 MG SL tablet Place 0.4 mg under the tongue every 5 (five) minutes as needed for chest pain.       Discharge Medications: Please see discharge summary for a list of discharge medications.  Relevant Imaging Results:  Relevant Lab Results:   Additional Information SSN:  [id number]  Joana Reamer, [place]

## 2015-06-24 NOTE — ED Notes (Addendum)
Pt out of room disoriented. Pt acting aggressive towards staff not wanting to return to room, RN Rush Landmark) made aware

## 2015-06-25 MED ORDER — RISPERIDONE 0.5 MG PO TABS
ORAL_TABLET | ORAL | Status: AC
  Administered 2015-06-25: 0.5 mg
  Filled 2015-06-25: qty 1

## 2015-06-25 NOTE — ED Notes (Signed)
BEHAVIORAL HEALTH ROUNDING Patient sleeping: No. Patient alert and oriented: yes Behavior appropriate: Yes.  ; If no, describe:  Nutrition and fluids offered: yes Toileting and hygiene offered: Yes  Sitter present: q15 minute observations and security  monitoring Law enforcement present: Yes  ODS  

## 2015-06-25 NOTE — ED Notes (Signed)
I placed breakfast tray by her bedside  Patient observed lying in bed with eyes closed  Even, unlabored respirations observed   NAD pt appears to be sleeping  I will continue to monitor along with every 15 minute visual observations and ongoing camera monitoring

## 2015-06-25 NOTE — ED Notes (Signed)
BEHAVIORAL HEALTH ROUNDING Patient sleeping: Yes.   Patient alert and oriented: eyes closed  Appears to be asleep Behavior appropriate: Yes.  ; If no, describe:  Nutrition and fluids offered: asleep  Toileting and hygiene offered: sleeping Sitter present: q 15 minute observations and security monitoring Law enforcement present: yes  ODS

## 2015-06-25 NOTE — ED Notes (Signed)
BEHAVIORAL HEALTH ROUNDING Patient sleeping: Yes.   Patient alert and oriented: eyes closed  Appears to be asleep Behavior appropriate: Yes.  ; If no, describe:  Nutrition and fluids offered: sleeping Toileting and hygiene offered: sleeping Sitter present: q 15 minute observations and security monitoring Law enforcement present: yes  ODS 

## 2015-06-25 NOTE — ED Notes (Signed)
Returned call to [employer]. Son) confirmed plan to send [facility] on Monday.

## 2015-06-25 NOTE — Consult Note (Signed)
[place] Psychiatry Consult   Reason for Consult:  Consult for this 70 year old woman with a history of multiple medical problems and dementia who was brought in by law enforcement this morning after being found lost in the community Referring Physician:  Owens Shark Patient Identification: Kaitlyn Simmons MRN:  [id number] Principal Diagnosis: Dementia with behavioral disturbance Diagnosis:   Patient Active Problem List   Diagnosis Date Noted  . Dementia with behavioral disturbance [F03.91] 06/21/2015  . Dementia [F03.90] 03/29/2015  . Body mass index 40.0-44.9, adult ([place]) [Z68.41] 12/17/2014  . Morbid obesity ([place]) [E66.01] 12/17/2014  . Depression [F32.9] 09/17/2014  . Calculus of bile duct without mention of cholecystitis or obstruction [K80.50] 10/08/2012  . Abnormal cholangiogram [R93.2] 10/08/2012  . Mucosal abnormality of duodenum [K31.9] 10/08/2012  . Abnormal LFTs (liver function tests) [R79.89] 09/12/2012  . Abdominal pain, epigastric [R10.13] 09/12/2012  . Non-STEMI (non-ST elevated myocardial infarction) ([place]) [I21.4] 09/11/2012  . CAD (coronary artery disease) [I25.10]   . Myocardial infarction ([place]) [I21.3]   . Coronary artery disease [I25.10]   . CHF (congestive heart failure) ([place]) [I50.9]   . HTN (hypertension) [I10]   . Hyperlipidemia [E78.5]   . Ischemic cardiomyopathy [I25.5]     Total Time spent with patient: 20 minutes  Subjective:   Kaitlyn Simmons is a 70 y.o. female patient admitted with "I don't know".  Patient with dementia. Wandering behavior. No new complaints today. Vital signs stable. No change to physical exam.  Updated note Saturday the 29th. Patient has no new complaints. Patient interviewed. Vitals reviewed. 70 year old woman with a history of dementia. She says she is feeling okay today. No complaints of pain no new physical complaints. We are awaiting placement which I'm told will occur on Monday.  HPI:  Patient interviewed. Chart reviewed.  Labs and vitals reviewed. Case discussed with social work and ER physician. 70 year old woman was brought in by Event organiser. The report was that she had wandered away from her place of living and was found hiding in a building somewhere. Patient on interview this morning could not remember anything about how she came to be in the hospital. She was not able in fact to tell me where she was currently nor was she able to tell me the correct year or anything about her current situation. She denied any specific physical complaints denying pain and nausea and shortness of breath chest pain or fatigue. She denied having auditory or visual hallucinations. Denied any suicidal or homicidal ideation. Patient has no idea whether she is on medication at baseline and has no idea whether she has been taking them. She is an unreliable historian as far as her recent living situation and where she has been recently or anything about recent events. After she was brought into the emergency room it sounds like adult protective services became involved and are investigating. At this point it still remains unclear to me where she had actually been living.  Past psychiatric history: Other than a documented history of dementia that has been progressive since she had a heart attack there is no known past psychiatric history. No history of suicidal or violent behavior.  Medical history: History of multiple medical problems including hypertension and hyperlipidemia history of myocardial infarction and coronary artery disease, history of congestive heart failure.  Substance abuse history: Patient denies any lifetime use of alcohol or drugs or any substance abuse problems  Past Psychiatric History: Prozac is listed as being one of her medicine although there is  nothing in her chart to suggest a full blown diagnosis of depression. It was mentioned in passing by some of her doctors. No history of psychiatric hospitalization no history  of suicidal or aggressive behavior. No known history of inpatient treatment.  Risk to Self: Is patient at risk for suicide?: No Risk to Others:   Prior Inpatient Therapy:   Prior Outpatient Therapy:    Past Medical History:  Past Medical History  Diagnosis Date  . CAD (coronary artery disease)     a. s/p IL STEMI 7/11 => tx with BMS to RCA (tx at [facility]);  b.  AL STEMI 7/12 => LHC: mRCA 100% ==> PCI with BMS to RCA; LM 10%, pCFX 50%, OM1 10%, pLAD 50%, mLAD 99%, 70%  ==>  c. staged PCI: BMS to  LAD (tx at [facility])  . Chronic systolic heart failure ([place])   . HTN (hypertension)   . Hyperlipidemia   . Ischemic cardiomyopathy     a. EF 30-35% at time of AL STEMI 7/12;   b. IMPROVED by f/u Echo 9/13:  mild LVH, EF 45-50%, mid to dist Inf, AS, Apical HK, Gr 1 diast dysfn, MAC, mod LAE  . Cataracts, bilateral   . CHF (congestive heart failure) ([place])   . GERD (gastroesophageal reflux disease)   . STEMI (ST elevation myocardial infarction) ([place]) 2011; 2012    [place]; [place]  . Poor short-term memory   . Dementia     Past Surgical History  Procedure Laterality Date  . Tonsillectomy and adenoidectomy  1980's  . Coronary angioplasty with stent placement  2011; 2012    RCA; RCA & LAD  . Dilation and curettage of uterus  1970's  . Cataract extraction w/ intraocular lens implant Left ~ 2008  . Cholecystectomy N/A 10/07/2012    Procedure: LAPAROSCOPIC CHOLECYSTECTOMY WITH INTRAOPERATIVE CHOLANGIOGRAM;  Surgeon: Shann Medal, MD;  Location: [facility] ORS;  Service: General;  Laterality: N/A;  . Ercp N/A 10/08/2012    Procedure: ENDOSCOPIC RETROGRADE CHOLANGIOPANCREATOGRAPHY (ERCP);  Surgeon: Gatha Mayer, MD;  Location: [facility];  Service: Endoscopy;  Laterality: N/A;  . Ercp w/ sphicterotomy  11/10/2012    stones removed [place]   Family History:  Family History  Problem Relation Age of Onset  . Heart attack Father     DECEASED  . Stroke Father     DECEASED  . Leukemia Mother     DECEASED  . Heart  attack Sister     ALIVE  . Hypertension Sister    Family Psychiatric  History: Patient was not able to give any history about family history despite my asking. Social History:  History  Alcohol Use No     History  Drug Use No    Social History   Social History  . Marital Status: Single    Spouse Name: N/A  . Number of Children: N/A  . Years of Education: N/A   Social History Main Topics  . Smoking status: Former Smoker -- 2.00 packs/day for 30 years    Types: Cigarettes    Quit date: 10/25/2002  . Smokeless tobacco: Never Used  . Alcohol Use: No  . Drug Use: No  . Sexual Activity: No   Other Topics Concern  . None   Social History Narrative   Additional Social History:    Allergies:  No Known Allergies  Labs:  No results found for this or any previous visit (from the past 48 hour(s)).  Current Facility-Administered Medications  Medication Dose  Route Frequency Provider Last Rate Last Dose  . aspirin EC tablet 81 mg  81 mg Oral Daily Gonzella Lex, MD   81 mg at 06/25/15 1319  . atorvastatin (LIPITOR) tablet 80 mg  80 mg Oral q1800 Gonzella Lex, MD   80 mg at 06/24/15 1917  . carvedilol (COREG) tablet 12.5 mg  12.5 mg Oral BID WC Gonzella Lex, MD   12.5 mg at 06/25/15 1317  . cephALEXin (KEFLEX) capsule 500 mg  500 mg Oral Q12H Gregor Hams, MD   500 mg at 06/25/15 1316  . clopidogrel (PLAVIX) tablet 75 mg  75 mg Oral Daily Gonzella Lex, MD   75 mg at 06/25/15 1319  . FLUoxetine (PROZAC) capsule 20 mg  20 mg Oral Daily Gonzella Lex, MD   20 mg at 06/25/15 1319  . furosemide (LASIX) tablet 40 mg  40 mg Oral Daily Gonzella Lex, MD   40 mg at 06/25/15 1318  . lisinopril (PRINIVIL,ZESTRIL) tablet 2.5 mg  2.5 mg Oral Daily Sylvan Cheese, MD   2.5 mg at 06/24/15 1005  . nitroGLYCERIN (NITROSTAT) SL tablet 0.4 mg  0.4 mg Sublingual Q5 min PRN Gonzella Lex, MD      . risperiDONE (RISPERDAL M-TABS) disintegrating tablet 0.25 mg  0.25 mg Oral Q6H PRN Gonzella Lex, MD   0.25 mg at 06/24/15 2240  . risperiDONE (RISPERDAL M-TABS) disintegrating tablet 0.5 mg  0.5 mg Oral BID AC Gonzella Lex, MD   0.5 mg at 06/25/15 1316  . tuberculin injection 5 Units  5 Units Intradermal Once Carrie Mew, MD   5 Units at 06/23/15 2105   Current Outpatient Prescriptions  Medication Sig Dispense Refill  . aspirin EC 81 MG tablet Take 81 mg by mouth as needed.     Marland Kitchen atorvastatin (LIPITOR) 80 MG tablet Take 1 tablet (80 mg total) by mouth every morning. (Patient taking differently: Take 80 mg by mouth at bedtime. ) 30 tablet 12  . carvedilol (COREG) 25 MG tablet Take 1/2 tablet twice a day. 60 tablet 6  . clopidogrel (PLAVIX) 75 MG tablet Take 1 tablet (75 mg total) by mouth daily. 30 tablet 12  . FLUoxetine (PROZAC) 20 MG capsule TAKE 1 CAPSULE BY MOUTH ONCE EVERY MORNING 30 capsule 6  . furosemide (LASIX) 40 MG tablet Take 1 tablet (40 mg total) by mouth every morning. 30 tablet 12  . lisinopril (ZESTRIL) 2.5 MG tablet Take 1 tablet (2.5 mg total) by mouth daily. 30 tablet 6  . nitroGLYCERIN (NITROSTAT) 0.4 MG SL tablet Place 0.4 mg under the tongue every 5 (five) minutes as needed for chest pain.      Musculoskeletal: Strength & Muscle Tone: decreased Gait & Station: unsteady Patient leans: N/A  Psychiatric Specialty Exam: Review of Systems  Constitutional: Negative.   HENT: Negative.   Eyes: Negative.   Respiratory: Negative.   Cardiovascular: Negative.   Gastrointestinal: Negative.   Musculoskeletal: Negative.   Skin: Negative.   Neurological: Negative.   Psychiatric/Behavioral: Positive for memory loss. Negative for depression, suicidal ideas, hallucinations and substance abuse. The patient is not nervous/anxious and does not have insomnia.     Blood pressure 117/68, pulse 69, temperature 97.7 F (36.5 C), temperature source Axillary, resp. rate 18, height 5\' 3"  (1.6 m), weight 89.359 kg (197 lb), SpO2 97 %.Body mass index is 34.91 kg/(m^2).   General Appearance: Disheveled  Eye Contact::  Fair  Speech:  Slow  Volume:  Decreased  Mood:  Euthymic  Affect:  Constricted  Thought Process:  Disorganized  Orientation:  Negative  Thought Content:  Confusion. Very demented  Suicidal Thoughts:  No  Homicidal Thoughts:  No  Memory:  Very poor. Couldn't even repeat 1 out of 3 objects after several attempts to get her to repeat.  Judgement:  Impaired  Insight:  Lacking  Psychomotor Activity:  Decreased  Concentration:  Poor  Recall:  Poor  Fund of Knowledge:Poor  Language: Poor  Akathisia:  No  Handed:  Right  AIMS (if indicated):     Assets:  Social Support  ADL's:  Impaired  Cognition: Impaired,  Moderate and Severe  Sleep:      Treatment Plan Summary: Medication management and Plan No change to current medication. Explained situation to patient. Supportive counseling completed. Hoping for discharge by Monday.  Disposition: Patient does not meet criteria for psychiatric inpatient admission. Supportive therapy provided about ongoing stressors.  Alethia Berthold, MD 06/25/2015 3:38 PM

## 2015-06-25 NOTE — ED Notes (Signed)
Patient is IVC and will be sent to [facility] on Monday.

## 2015-06-25 NOTE — ED Notes (Signed)
Patient observed lying in bed with eyes closed  Even, unlabored respirations observed   NAD pt appears to be sleeping  I will continue to monitor along with every 15 minute visual observations and ongoing security monitoring    

## 2015-06-25 NOTE — ED Notes (Signed)

## 2015-06-25 NOTE — ED Provider Notes (Signed)
-----------------------------------------   6:45 AM on 06/25/2015 -----------------------------------------   Blood pressure 100/67, pulse 56, temperature 98.1 F (36.7 C), temperature source Oral, resp. rate 18, height 5\' 3"  (1.6 m), weight 197 lb (89.359 kg), SpO2 97 %.  The patient had no acute events since last update.  Calm and cooperative at this time.  Disposition is pending per Psychiatry/Behavioral Medicine team recommendations.     Orbie Pyo, MD 06/25/15 [phone]

## 2015-06-25 NOTE — ED Notes (Signed)
Pt observed lying in bed with herTV on the care channel  - she verbalizes  "Look [place]."    Pt visualized with NAD  No verbalized needs or concerns at this time  Continue to monitor

## 2015-06-25 NOTE — ED Notes (Signed)
hospital bed supplied to pt

## 2015-06-25 NOTE — ED Notes (Signed)
Lunch provided along with an extra drink  Assessment completed  She denies pain   Continue to monitor

## 2015-06-25 NOTE — ED Notes (Signed)
ED BHU [place] Is the patient under IVC or is there intent for IVC: Yes.   Is the patient medically cleared: Yes.   Is there vacancy in the ED BHU: Yes.   Is the population mix appropriate for patient: no dementia Is the patient awaiting placement in inpatient or outpatient setting: Yes.  Geriatric placement   Has the patient had a psychiatric consult: Yes.   Survey of unit performed for contraband, proper placement and condition of furniture, tampering with fixtures in bathroom, shower, and each patient room: Yes.  ; Findings:  APPEARANCE/BEHAVIOR Calm and cooperative NEURO ASSESSMENT Orientation: oriented to self  Denies pain Hallucinations: No.None noted (Hallucinations) Speech: Normal Gait: normal RESPIRATORY ASSESSMENT Even  Unlabored respirations  CARDIOVASCULAR ASSESSMENT Pulses equal   regular rate  Skin warm and dry   GASTROINTESTINAL ASSESSMENT no GI complaint EXTREMITIES Full ROM  PLAN OF CARE Provide calm/safe environment. Vital signs assessed twice daily. ED BHU Assessment once each 12-hour shift. Collaborate with intake RN daily or as condition indicates. Assure the ED provider has rounded once each shift. Provide and encourage hygiene. Provide redirection as needed. Assess for escalating behavior; address immediately and inform ED provider.  Assess family dynamic and appropriateness for visitation as needed: Yes.  ; If necessary, describe findings:  Educate the patient/family about BHU procedures/visitation: Yes.  ; If necessary, describe findings:

## 2015-06-25 NOTE — ED Notes (Signed)
Am meds not administered at this time - pt continues to rest quietly  I will continue to monitor

## 2015-06-25 NOTE — ED Notes (Addendum)
Pt had exceptionally restful night, none of last nights spontaneous awakenings, attempts to leave, and very pleasant when awake, pt recognized this RN requiring no reorientation

## 2015-06-26 NOTE — ED Notes (Signed)
Pt able to take medications with no difficulty. Pt alert at this time. Pt sat up in hospital bed at this time. Pt denies needs at this time.

## 2015-06-26 NOTE — ED Provider Notes (Signed)
-----------------------------------------   11:41 AM on 06/26/2015 -----------------------------------------  Patient found to be somewhat bradycardic around 50 bpm. Denies any chest pain or other symptoms. Blood pressure remained stable in the low 100s which is where the patient stays. Repeat EKG interpreted below   EKG 11:32:44. An interpreter, so shows sinus bradycardia at 48 bpm, narrow QRS, normal axis, largely normal intervals, nonspecific ST changes other largely unchanged from prior EKGs 09/12/12.   Harvest Dark, MD 06/26/15 1141

## 2015-06-26 NOTE — ED Notes (Signed)

## 2015-06-26 NOTE — ED Notes (Signed)
Pt resting in bed with her eyes closed.NAD noted at this time. Respirations even and unlabored at this time.

## 2015-06-26 NOTE — Consult Note (Signed)
[name]   Reason for Consult:  Consult for this 70 year old woman with a history of multiple medical problems and dementia who was brought in by law enforcement this morning after being found lost in the community Referring Physician:  Owens Shark Patient Identification: Kaitlyn Simmons MRN:  [id number] Principal Diagnosis: Dementia with behavioral disturbance Diagnosis:   Patient Active Problem List   Diagnosis Date Noted  . Dementia with behavioral disturbance [F03.91] 06/21/2015  . Dementia [F03.90] 03/29/2015  . Body mass index 40.0-44.9, adult ([place]) [Z68.41] 12/17/2014  . Morbid obesity ([place]) [E66.01] 12/17/2014  . Depression [F32.9] 09/17/2014  . Calculus of bile duct without mention of cholecystitis or obstruction [K80.50] 10/08/2012  . Abnormal cholangiogram [R93.2] 10/08/2012  . Mucosal abnormality of duodenum [K31.9] 10/08/2012  . Abnormal LFTs (liver function tests) [R79.89] 09/12/2012  . Abdominal pain, epigastric [R10.13] 09/12/2012  . Non-STEMI (non-ST elevated myocardial infarction) ([place]) [I21.4] 09/11/2012  . CAD (coronary artery disease) [I25.10]   . Myocardial infarction ([place]) [I21.3]   . Coronary artery disease [I25.10]   . CHF (congestive heart failure) ([place]) [I50.9]   . HTN (hypertension) [I10]   . Hyperlipidemia [E78.5]   . Ischemic cardiomyopathy [I25.5]     Total Time spent with patient: 20 minutes  Subjective:   Kaitlyn Simmons is a 70 y.o. female patient admitted with "I don't know".  Patient with dementia. Wandering behavior. No new complaints today. Vital signs stable. No change to physical exam.  Update for Sunday the 30th. Patient was awake but only partially interactive. Stays in bed for the most part. No complaints of any new physical problems. No complaints of any mood or behavior problems. Blood pressure remains low. She has not had any falls. No evidence of suicidality. Continues to be very demented.  HPI:  Patient  interviewed. Chart reviewed. Labs and vitals reviewed. Case discussed with social work and ER physician. 70 year old woman was brought in by Event organiser. The report was that she had wandered away from her place of living and was found hiding in a building somewhere. Patient on interview this morning could not remember anything about how she came to be in the hospital. She was not able in fact to tell me where she was currently nor was she able to tell me the correct year or anything about her current situation. She denied any specific physical complaints denying pain and nausea and shortness of breath chest pain or fatigue. She denied having auditory or visual hallucinations. Denied any suicidal or homicidal ideation. Patient has no idea whether she is on medication at baseline and has no idea whether she has been taking them. She is an unreliable historian as far as her recent living situation and where she has been recently or anything about recent events. After she was brought into the emergency room it sounds like adult protective services became involved and are investigating. At this point it still remains unclear to me where she had actually been living.  Past psychiatric history: Other than a documented history of dementia that has been progressive since she had a heart attack there is no known past psychiatric history. No history of suicidal or violent behavior.  Medical history: History of multiple medical problems including hypertension and hyperlipidemia history of myocardial infarction and coronary artery disease, history of congestive heart failure.  Substance abuse history: Patient denies any lifetime use of alcohol or drugs or any substance abuse problems  Past Psychiatric History: Prozac is listed as being one  of her medicine although there is nothing in her chart to suggest a full blown diagnosis of depression. It was mentioned in passing by some of her doctors. No history of  psychiatric hospitalization no history of suicidal or aggressive behavior. No known history of inpatient treatment.  Risk to Self: Is patient at risk for suicide?: No Risk to Others:   Prior Inpatient Therapy:   Prior Outpatient Therapy:    Past Medical History:  Past Medical History  Diagnosis Date  . CAD (coronary artery disease)     a. s/p IL STEMI 7/11 => tx with BMS to RCA (tx at [facility]);  b.  AL STEMI 7/12 => LHC: mRCA 100% ==> PCI with BMS to RCA; LM 10%, pCFX 50%, OM1 10%, pLAD 50%, mLAD 99%, 70%  ==>  c. staged PCI: BMS to  LAD (tx at [facility])  . Chronic systolic heart failure ([place])   . HTN (hypertension)   . Hyperlipidemia   . Ischemic cardiomyopathy     a. EF 30-35% at time of AL STEMI 7/12;   b. IMPROVED by f/u Echo 9/13:  mild LVH, EF 45-50%, mid to dist Inf, AS, Apical HK, Gr 1 diast dysfn, MAC, mod LAE  . Cataracts, bilateral   . CHF (congestive heart failure) ([place])   . GERD (gastroesophageal reflux disease)   . STEMI (ST elevation myocardial infarction) ([place]) 2011; 2012    [place]; [place]  . Poor short-term memory   . Dementia     Past Surgical History  Procedure Laterality Date  . Tonsillectomy and adenoidectomy  1980's  . Coronary angioplasty with stent placement  2011; 2012    RCA; RCA & LAD  . Dilation and curettage of uterus  1970's  . Cataract extraction w/ intraocular lens implant Left ~ 2008  . Cholecystectomy N/A 10/07/2012    Procedure: LAPAROSCOPIC CHOLECYSTECTOMY WITH INTRAOPERATIVE CHOLANGIOGRAM;  Surgeon: Shann Medal, MD;  Location: [facility];  Service: General;  Laterality: N/A;  . Ercp N/A 10/08/2012    Procedure: ENDOSCOPIC RETROGRADE CHOLANGIOPANCREATOGRAPHY (ERCP);  Surgeon: Gatha Mayer, MD;  Location: [facility];  Service: Endoscopy;  Laterality: N/A;  . Ercp w/ sphicterotomy  11/10/2012    stones removed [facility]   Family History:  Family History  Problem Relation Age of Onset  . Heart attack Father     DECEASED  . Stroke Father     DECEASED  .  Leukemia Mother     DECEASED  . Heart attack Sister     ALIVE  . Hypertension Sister    Family Psychiatric  History: Patient was not able to give any history about family history despite my asking. Social History:  History  Alcohol Use No     History  Drug Use No    Social History   Social History  . Marital Status: Single    Spouse Name: N/A  . Number of Children: N/A  . Years of Education: N/A   Social History Main Topics  . Smoking status: Former Smoker -- 2.00 packs/day for 30 years    Types: Cigarettes    Quit date: 10/25/2002  . Smokeless tobacco: Never Used  . Alcohol Use: No  . Drug Use: No  . Sexual Activity: No   Other Topics Concern  . None   Social History Narrative   Additional Social History:    Allergies:  No Known Allergies  Labs:  No results found for this or any previous visit (from the past 48 hour(s)).  Current Facility-Administered Medications  Medication Dose Route Frequency Provider Last Rate Last Dose  . aspirin EC tablet 81 mg  81 mg Oral Daily Gonzella Lex, MD   81 mg at 06/26/15 1152  . atorvastatin (LIPITOR) tablet 80 mg  80 mg Oral q1800 Gonzella Lex, MD   80 mg at 06/25/15 1918  . carvedilol (COREG) tablet 12.5 mg  12.5 mg Oral BID WC Gonzella Lex, MD   Stopped at 06/26/15 1144  . clopidogrel (PLAVIX) tablet 75 mg  75 mg Oral Daily Gonzella Lex, MD   75 mg at 06/26/15 1153  . FLUoxetine (PROZAC) capsule 20 mg  20 mg Oral Daily Gonzella Lex, MD   20 mg at 06/26/15 1153  . furosemide (LASIX) tablet 40 mg  40 mg Oral Daily Gonzella Lex, MD   40 mg at 06/26/15 1153  . lisinopril (PRINIVIL,ZESTRIL) tablet 2.5 mg  2.5 mg Oral Daily Sylvan Cheese, MD   Stopped at 06/26/15 1151  . nitroGLYCERIN (NITROSTAT) SL tablet 0.4 mg  0.4 mg Sublingual Q5 min PRN Gonzella Lex, MD      . risperiDONE (RISPERDAL M-TABS) disintegrating tablet 0.25 mg  0.25 mg Oral Q6H PRN Gonzella Lex, MD   0.25 mg at 06/24/15 2240  . risperiDONE  (RISPERDAL M-TABS) disintegrating tablet 0.5 mg  0.5 mg Oral BID AC Gonzella Lex, MD   0.5 mg at 06/26/15 1429   Current Outpatient Prescriptions  Medication Sig Dispense Refill  . aspirin EC 81 MG tablet Take 81 mg by mouth as needed.     Marland Kitchen atorvastatin (LIPITOR) 80 MG tablet Take 1 tablet (80 mg total) by mouth every morning. (Patient taking differently: Take 80 mg by mouth at bedtime. ) 30 tablet 12  . carvedilol (COREG) 25 MG tablet Take 1/2 tablet twice a day. 60 tablet 6  . clopidogrel (PLAVIX) 75 MG tablet Take 1 tablet (75 mg total) by mouth daily. 30 tablet 12  . FLUoxetine (PROZAC) 20 MG capsule TAKE 1 CAPSULE BY MOUTH ONCE EVERY MORNING 30 capsule 6  . furosemide (LASIX) 40 MG tablet Take 1 tablet (40 mg total) by mouth every morning. 30 tablet 12  . lisinopril (ZESTRIL) 2.5 MG tablet Take 1 tablet (2.5 mg total) by mouth daily. 30 tablet 6  . nitroGLYCERIN (NITROSTAT) 0.4 MG SL tablet Place 0.4 mg under the tongue every 5 (five) minutes as needed for chest pain.      Musculoskeletal: Strength & Muscle Tone: decreased Gait & Station: unsteady Patient leans: N/A  Psychiatric Specialty Exam: Review of Systems  Constitutional: Negative.   HENT: Negative.   Eyes: Negative.   Respiratory: Negative.   Cardiovascular: Negative.   Gastrointestinal: Negative.   Musculoskeletal: Negative.   Skin: Negative.   Neurological: Negative.   Psychiatric/Behavioral: Positive for memory loss. Negative for depression, suicidal ideas, hallucinations and substance abuse. The patient is not nervous/anxious and does not have insomnia.     Blood pressure 94/41, pulse 57, temperature 98 F (36.7 C), temperature source Oral, resp. rate 16, height 5\' 3"  (1.6 m), weight 89.359 kg (197 lb), SpO2 96 %.Body mass index is 34.91 kg/(m^2).  General Appearance: Disheveled  Eye Sport and exercise psychologist::  Fair  Speech:  Slow  Volume:  Decreased  Mood:  Euthymic  Affect:  Constricted  Thought Process:  Disorganized   Orientation:  Negative  Thought Content:  Confusion. Very demented  Suicidal Thoughts:  No  Homicidal Thoughts:  No  Memory:  Very poor. Couldn't even repeat 1 out of 3 objects after several attempts to get her to repeat.  Judgement:  Impaired  Insight:  Lacking  Psychomotor Activity:  Decreased  Concentration:  Poor  Recall:  Poor  Fund of Knowledge:Poor  Language: Poor  Akathisia:  No  Handed:  Right  AIMS (if indicated):     Assets:  Social Support  ADL's:  Impaired  Cognition: Impaired,  Moderate and Severe  Sleep:      Treatment Plan Summary: No change to treatment plan. Continue current medicine. Patient is psychiatrically stable. We have been awaiting placement for several days and hopefully something will be coming through soon.  Disposition: Patient does not meet criteria for psychiatric inpatient admission. Supportive therapy provided about ongoing stressors.  Alethia Berthold, MD 06/26/2015 4:04 PM

## 2015-06-26 NOTE — ED Notes (Signed)
Pt resting in bed at this time. Pt given a lunch tray. Pt sitting up eating at this time. Will continue to monitor with 40min safety checks.

## 2015-06-26 NOTE — ED Notes (Signed)
Pt alert at this time. Pt noted to be moving around in the bed without assistance. Pt noted to be confused which is patient's baseline. NAD noted. Will continue to monitor at this time.

## 2015-06-26 NOTE — ED Notes (Signed)
No change in patient condition. Pt resting in bed at this time. Respirations even and unlabored. Will continue to monitor at this time with 15 min safety checks.

## 2015-06-26 NOTE — ED Notes (Signed)
Pt asked this nurse to fix BP cuff. Taped edge of BP cuff down, pt appeased at this time. Pt watching TV which is on the care channel. Will continue to monitor with 15 min safety checks at this time.

## 2015-06-26 NOTE — ED Notes (Signed)
Pt resting in bed at this time. Respirations even and unlabored at this time.Will continue to monitor with 15 min safety checks.

## 2015-06-26 NOTE — ED Notes (Signed)
Report given to Shanon Brow, RN. Shanon Brow informed of hypotensive event today and increased monitoring, pt has since removed her own cardiac leads and BP cuff.

## 2015-06-26 NOTE — ED Notes (Signed)
Report from matt, rn.  

## 2015-06-26 NOTE — ED Notes (Signed)
Pt given piles of wash cloths to fold. Pt calm and cooperative at this time. Denies needs at this time. Will continue to monitor with 15 min safety checks.

## 2015-06-26 NOTE — ED Notes (Signed)
Pt resting in bed at this time. Awakens with mild stimuli. Respirations even and unlabored at this time.

## 2015-06-26 NOTE — ED Notes (Signed)
Waiting for transport on Monday

## 2015-06-26 NOTE — ED Notes (Signed)
Report to megan, rn.  

## 2015-06-26 NOTE — ED Notes (Signed)
Patient given breakfast tray. Patient resting in bed with eyes closed. Even respirations noted.

## 2015-06-26 NOTE — ED Notes (Signed)
Read PPD at the request of Holli Humbles, CSW, PPD noted to be negative at this time.

## 2015-06-26 NOTE — ED Notes (Signed)
Pt given a dinner tray, pt sitting on side of bed, stating she can't eat her dinner tray because "it's supposed to go to her son". NAD noted. No change in patient condition at this time.

## 2015-06-26 NOTE — ED Notes (Signed)
Pt visualized in NAD. Pt resting in bed with eyes closed, lights off. Respirations even and unlabored at this time.

## 2015-06-26 NOTE — ED Notes (Signed)
Pt sleeping, resps unlabored. Skin normal color warm and dry.

## 2015-06-26 NOTE — ED Provider Notes (Signed)
-----------------------------------------   7:09 AM on 06/26/2015 -----------------------------------------   Blood pressure 110/60, pulse 64, temperature 98.4 F (36.9 C), temperature source Oral, resp. rate 16, height 5\' 3"  (1.6 m), weight 197 lb (89.359 kg), SpO2 98 %.  The patient had no acute events since last update.  Calm and cooperative at this time.    The patient is waiting on placement at [facility].     Loney Hering, MD 06/26/15 [phone]

## 2015-06-26 NOTE — ED Notes (Signed)

## 2015-06-26 NOTE — ED Notes (Signed)
MD notified of patient's bradycardia,EKG obtained, and patient placed on the monitor. Will continue to monitor at this time.Pt calm and cooperative at this time.

## 2015-06-26 NOTE — ED Notes (Signed)
Pt ambulated to the bathroom at this time.

## 2015-06-27 MED ORDER — FOSFOMYCIN TROMETHAMINE 3 G PO PACK
3.0000 g | PACK | Freq: Once | ORAL | Status: AC
  Administered 2015-06-27: 3 g via ORAL
  Filled 2015-06-27: qty 3

## 2015-06-27 MED ORDER — LISINOPRIL 2.5 MG PO TABS: 2.5000 mg | ORAL_TABLET | Freq: Every day | ORAL | Status: DC

## 2015-06-27 MED ORDER — ASPIRIN EC 81 MG PO TBEC: 81.0000 mg | DELAYED_RELEASE_TABLET | ORAL | Status: DC | PRN

## 2015-06-27 MED ORDER — CLOPIDOGREL BISULFATE 75 MG PO TABS: 75.0000 mg | ORAL_TABLET | Freq: Every day | ORAL | Status: DC

## 2015-06-27 MED ORDER — FUROSEMIDE 40 MG PO TABS: 40.0000 mg | ORAL_TABLET | Freq: Every morning | ORAL | Status: DC

## 2015-06-27 MED ORDER — RISPERIDONE 0.5 MG PO TABS: 0.5000 mg | ORAL_TABLET | Freq: Two times a day (BID) | ORAL | Status: DC

## 2015-06-27 MED ORDER — RISPERIDONE 0.25 MG PO TABS: 0.2500 mg | ORAL_TABLET | Freq: Four times a day (QID) | ORAL | Status: DC | PRN

## 2015-06-27 MED ORDER — NITROGLYCERIN 0.4 MG SL SUBL: 0.4000 mg | SUBLINGUAL_TABLET | SUBLINGUAL | Status: AC | PRN

## 2015-06-27 MED ORDER — FLUOXETINE HCL 20 MG PO CAPS: ORAL_CAPSULE | ORAL | Status: DC

## 2015-06-27 MED ORDER — ATORVASTATIN CALCIUM 80 MG PO TABS: 80.0000 mg | ORAL_TABLET | Freq: Every day | ORAL | Status: DC

## 2015-06-27 NOTE — ED Provider Notes (Addendum)
[facility] Emergency Department Provider progress Note  ____________________________________________  Time seen: 10:40 AM today  I have reviewed the triage vital signs and the nursing notes.   HISTORY  Chief Complaint Psychiatric Evaluation   HPI Kaitlyn Simmons is a 70 y.o. female Patient brought to the ED due to confusion. She was found wandering away from home and had episodes of forgetfulness and agitation.      Past Medical History  Diagnosis Date  . CAD (coronary artery disease)     a. s/p IL STEMI 7/11 => tx with BMS to RCA (tx at [facility]);  b.  AL STEMI 7/12 => LHC: mRCA 100% ==> PCI with BMS to RCA; LM 10%, pCFX 50%, OM1 10%, pLAD 50%, mLAD 99%, 70%  ==>  c. staged PCI: BMS to  LAD (tx at [facility])  . Chronic systolic heart failure ([place])   . HTN (hypertension)   . Hyperlipidemia   . Ischemic cardiomyopathy     a. EF 30-35% at time of AL STEMI 7/12;   b. IMPROVED by f/u Echo 9/13:  mild LVH, EF 45-50%, mid to dist Inf, AS, Apical HK, Gr 1 diast dysfn, MAC, mod LAE  . Cataracts, bilateral   . CHF (congestive heart failure) ([place])   . GERD (gastroesophageal reflux disease)   . STEMI (ST elevation myocardial infarction) ([place]) 2011; 2012    [place]  . Poor short-term memory   . Dementia      Patient Active Problem List   Diagnosis Date Noted  . Dementia with behavioral disturbance 06/21/2015  . Dementia 03/29/2015  . Body mass index 40.0-44.9, adult ([place]) 12/17/2014  . Morbid obesity ([place]) 12/17/2014  . Depression 09/17/2014  . Calculus of bile duct without mention of cholecystitis or obstruction 10/08/2012  . Abnormal cholangiogram 10/08/2012  . Mucosal abnormality of duodenum 10/08/2012  . Abnormal LFTs (liver function tests) 09/12/2012  . Abdominal pain, epigastric 09/12/2012  . Non-STEMI (non-ST elevated myocardial infarction) ([place]) 09/11/2012  . CAD (coronary artery disease)   . Myocardial infarction ([place])   . Coronary artery disease   .  CHF (congestive heart failure) ([place])   . HTN (hypertension)   . Hyperlipidemia   . Ischemic cardiomyopathy      Past Surgical History  Procedure Laterality Date  . Tonsillectomy and adenoidectomy  1980's  . Coronary angioplasty with stent placement  2011; 2012    RCA; RCA & LAD  . Dilation and curettage of uterus  1970's  . Cataract extraction w/ intraocular lens implant Left ~ 2008  . Cholecystectomy N/A 10/07/2012    Procedure: LAPAROSCOPIC CHOLECYSTECTOMY WITH INTRAOPERATIVE CHOLANGIOGRAM;  Surgeon: Shann Medal, MD;  Location: [facility];  Service: General;  Laterality: N/A;  . Ercp N/A 10/08/2012    Procedure: ENDOSCOPIC RETROGRADE CHOLANGIOPANCREATOGRAPHY (ERCP);  Surgeon: Gatha Mayer, MD;  Location: [facility];  Service: Endoscopy;  Laterality: N/A;  . Ercp w/ sphicterotomy  11/10/2012    stones removed [facility]     Current Outpatient Rx  Name  Route  Sig  Dispense  Refill  . aspirin EC 81 MG tablet   Oral   Take 1 tablet (81 mg total) by mouth as needed.   30 tablet   1   . atorvastatin (LIPITOR) 80 MG tablet   Oral   Take 1 tablet (80 mg total) by mouth at bedtime.   30 tablet   1   . clopidogrel (PLAVIX) 75 MG tablet   Oral  Take 1 tablet (75 mg total) by mouth daily.   30 tablet   1   . FLUoxetine (PROZAC) 20 MG capsule      TAKE 1 CAPSULE BY MOUTH ONCE EVERY MORNING   30 capsule   1   . furosemide (LASIX) 40 MG tablet   Oral   Take 1 tablet (40 mg total) by mouth every morning.   30 tablet   1   . lisinopril (ZESTRIL) 2.5 MG tablet   Oral   Take 1 tablet (2.5 mg total) by mouth daily.   30 tablet   1   . nitroGLYCERIN (NITROSTAT) 0.4 MG SL tablet   Sublingual   Place 1 tablet (0.4 mg total) under the tongue every 5 (five) minutes as needed for chest pain.   30 tablet   2   . risperiDONE (RISPERDAL) 0.25 MG tablet   Oral   Take 1 tablet (0.25 mg total) by mouth every 6 (six) hours as needed (agitation).   60 tablet   1   . risperiDONE  (RISPERDAL) 0.5 MG tablet   Oral   Take 1 tablet (0.5 mg total) by mouth 2 (two) times daily.   60 tablet   1      Allergies Review of patient's allergies indicates no known allergies.   Family History  Problem Relation Age of Onset  . Heart attack Father     DECEASED  . Stroke Father     DECEASED  . Leukemia Mother     DECEASED  . Heart attack Sister     ALIVE  . Hypertension Sister     Social History Social History  Substance Use Topics  . Smoking status: Former Smoker -- 2.00 packs/day for 30 years    Types: Cigarettes    Quit date: 10/25/2002  . Smokeless tobacco: Never Used  . Alcohol Use: No     ____________________________________________   PHYSICAL EXAM:  VITAL SIGNS: ED Triage Vitals  Enc Vitals Group     BP 06/20/15 2204 146/74 mmHg     Pulse Rate 06/20/15 2204 58     Resp 06/20/15 2204 20     Temp 06/20/15 2204 97.6 F (36.4 C)     Temp Source 06/20/15 2204 Oral     SpO2 06/20/15 2204 98 %     Weight 06/20/15 2204 197 lb (89.359 kg)     Height 06/20/15 2204 5\' 3"  (1.6 m)     Head Cir --      Peak Flow --      Pain Score 06/20/15 2205 0     Pain Loc --      Pain Edu? --      Excl. in [place]? --     Vital signs reviewed, nursing assessments reviewed ____________________________________________    LABS (pertinent positives/negatives) (all labs ordered are listed, but only abnormal results are displayed) Labs Reviewed  COMPREHENSIVE METABOLIC PANEL - Abnormal; Notable for the following:    Glucose, Bld 102 (*)    Creatinine, Ser 1.11 (*)    GFR calc non Af Amer 49 (*)    GFR calc Af Amer 57 (*)    All other components within normal limits  CBC WITH DIFFERENTIAL/PLATELET - Abnormal; Notable for the following:    WBC 12.2 (*)    Neutro Abs 9.2 (*)    Monocytes Absolute 1.0 (*)    All other components within normal limits  URINALYSIS COMPLETEWITH MICROSCOPIC ([facility] ONLY) - Abnormal; Notable for the following:  Color, Urine AMBER (*)     APPearance HAZY (*)    Hgb urine dipstick 1+ (*)    Nitrite POSITIVE (*)    Leukocytes, UA 2+ (*)    Bacteria, UA FEW (*)    Squamous Epithelial / LPF 0-5 (*)    All other components within normal limits  URINE CULTURE  ETHANOL  URINE DRUG SCREEN, QUALITATIVE ([facility] ONLY)   ____________________________________________  ____________________________________________   INITIAL IMPRESSION / ASSESSMENT AND PLAN / ED COURSE  Pertinent labs & imaging results that were available during my care of the patient were reviewed by me and considered in my medical decision making (see chart for details).  On arrival, the patient had an extensive workup. She was found to have a urinary tract infection and started on Keflex twice a day to treat this. However, it appears this was inadvertently discontinued and she did not receive a full course to treat the urinary tract infection. Additionally, despite being ordered, a urine culture was not performed certainly have no confirmatory testing. She is therefore given a dose of fosfomycin in the emergency department on 06/27/2015 to ensure resolution of the urinary tract infection.  Related to the confusion and wandering behavior, she was evaluated by psychiatry who feels that this is dementia with behavioral disturbance and appropriate for placement in a memory care skilled nursing unit. She was stabilized on her current medication regimen and will be discharged with these prescriptions.  Heart rate and blood pressure have remained steady at a heart rate of 60 and a blood pressure of about 100/60. Because of this we have been holding her carvedilol and we'll discontinue it at discharge pending follow-up with primary care.       ____________________________________________   FINAL CLINICAL IMPRESSION(S) / ED DIAGNOSES  Final diagnoses:  Dementia, with behavioral disturbance  Acute cystitis without hematuria       Portions of this note were  generated with dragon dictation software. Dictation errors may occur despite best attempts at proofreading.   Carrie Mew, MD 06/27/15 [place], MD 06/27/15 1058

## 2015-06-27 NOTE — ED Notes (Signed)

## 2015-06-27 NOTE — ED Notes (Signed)

## 2015-06-27 NOTE — ED Notes (Signed)
Pt son here to pick up pt and take pt to [facility] for care.  Attempting to reach Holli Humbles, CSW for information and FL2.

## 2015-06-27 NOTE — Clinical Social Work Note (Signed)
Pt is ready for discharge today and will go to [facility]. Pt's son will provide transportation to facility. He also is filing for guardianship of pt. Pt is no longer under IVC. RN is aware and will go over discharge paperwork with pt's son. [address] [facility] is ready to admit pt. CSW prepared discharge packet. CSW also updated Arman Bogus with [facility]. CSW is signing off as no further needs identified.  Darden Dates, MSW, LCSW  Clinical Social Worker  [phone]

## 2015-06-27 NOTE — Discharge Instructions (Signed)
Dementia Dementia is a general term for problems with brain function. A person with dementia has memory loss and a hard time with at least one other brain function such as thinking, speaking, or problem solving. Dementia can affect social functioning, how you do your job, your mood, or your personality. The changes may be hidden for a long time. The earliest forms of this disease are usually not detected by family or friends. Dementia can be:  Irreversible.  Potentially reversible.  Partially reversible.  Progressive. This means it can get worse over time. CAUSES  Irreversible dementia causes may include:  Degeneration of brain cells (Alzheimer disease or Lewy body dementia).  Multiple small strokes (vascular dementia).  Infection (chronic meningitis or Creutzfeldt-Jakob disease).  Frontotemporal dementia. This affects younger people, age 40 to 70, compared to those who have Alzheimer disease.  Dementia associated with other disorders like Parkinson disease, Huntington disease, or HIV-associated dementia. Potentially or partially reversible dementia causes may include:  Medicines.  Metabolic causes such as excessive alcohol intake, vitamin B12 deficiency, or thyroid disease.  Masses or pressure in the brain such as a tumor, blood clot, or hydrocephalus. SIGNS AND SYMPTOMS  Symptoms are often hard to detect. Family members or coworkers may not notice them early in the disease process. Different people with dementia may have different symptoms. Symptoms can include:  A hard time with memory, especially recent memory. Long-term memory may not be impaired.  Asking the same question multiple times or forgetting something someone just said.  A hard time speaking your thoughts or finding certain words.  A hard time solving problems or performing familiar tasks (such as how to use a telephone).  Sudden changes in mood.  Changes in personality, especially increasing moodiness or  mistrust.  Depression.  A hard time understanding complex ideas that were never a problem in the past. DIAGNOSIS  There are no specific tests for dementia.   Your health care provider may recommend a thorough evaluation. This is because some forms of dementia can be reversible. The evaluation will likely include a physical exam and getting a detailed history from you and a family member. The history often gives the best clues and suggestions for a diagnosis.  Memory testing may be done. A detailed brain function evaluation called neuropsychologic testing may be helpful.  Lab tests and brain imaging (such as a CT scan or MRI scan) are sometimes important.  Sometimes observation and re-evaluation over time is very helpful. TREATMENT  Treatment depends on the cause.   If the problem is a vitamin deficiency, it may be helped or cured with supplements.  For dementias such as Alzheimer disease, medicines are available to stabilize or slow the course of the disease. There are no cures for this type of dementia.  Your health care provider can help direct you to groups, organizations, and other health care providers to help with decisions in the care of you or your loved one. HOME CARE INSTRUCTIONS The care of individuals with dementia is varied and dependent upon the progression of the dementia. The following suggestions are intended for the person living with, or caring for, the person with dementia.  Create a safe environment.  Remove the locks on bathroom doors to prevent the person from accidentally locking himself or herself in.  Use childproof latches on kitchen cabinets and any place where cleaning supplies, chemicals, or alcohol are kept.  Use childproof covers in unused electrical outlets.  Install childproof devices to keep doors and windows   secured.  Remove stove knobs or install safety knobs and an automatic shut-off on the stove.  Lower the temperature on water  heaters.  Label medicines and keep them locked up.  Secure knives, lighters, matches, power tools, and guns, and keep these items out of reach.  Keep the house free from clutter. Remove rugs or anything that might contribute to a fall.  Remove objects that might break and hurt the person.  Make sure lighting is good, both inside and outside.  Install grab rails as needed.  Use a monitoring device to alert you to falls or other needs for help.  Reduce confusion.  Keep familiar objects and people around.  Use night lights or dim lights at night.  Label items or areas.  Use reminders, notes, or directions for daily activities or tasks.  Keep a simple, consistent routine for waking, meals, bathing, dressing, and bedtime.  Create a calm, quiet environment.  Place large clocks and calendars prominently.  Display emergency numbers and home address near all telephones.  Use cues to establish different times of the day. An example is to open curtains to let the natural light in during the day.   Use effective communication.  Choose simple words and short sentences.  Use a gentle, calm tone of voice.  Be careful not to interrupt.  If the person is struggling to find a word or communicate a thought, try to provide the word or thought.  Ask one question at a time. Allow the person ample time to answer questions. Repeat the question again if the person does not respond.  Reduce nighttime restlessness.  Provide a comfortable bed.  Have a consistent nighttime routine.  Ensure a regular walking or physical activity schedule. Involve the person in daily activities as much as possible.  Limit napping during the day.  Limit caffeine.  Attend social events that stimulate rather than overwhelm the senses.  Encourage good nutrition and hydration.  Reduce distractions during meal times and snacks.  Avoid foods that are too hot or too cold.  Monitor chewing and swallowing  ability.  Continue with routine vision, hearing, dental, and medical screenings.  Give medicines only as directed by the health care provider.  Monitor driving abilities. Do not allow the person to drive when safe driving is no longer possible.  Register with an identification program which could provide location assistance in the event of a missing person situation. SEEK MEDICAL CARE IF:   New behavioral problems start such as moodiness, aggressiveness, or seeing things that are not there (hallucinations).  Any new problem with brain function happens. This includes problems with balance, speech, or falling a lot.  Problems with swallowing develop.  Any symptoms of other illness happen. Small changes or worsening in any aspect of brain function can be a sign that the illness is getting worse. It can also be a sign of another medical illness such as infection. Seeing a health care provider right away is important. SEEK IMMEDIATE MEDICAL CARE IF:   A fever develops.  New or worsened confusion develops.  New or worsened sleepiness develops.  Staying awake becomes hard to do.   This information is not intended to replace advice given to you by your health care provider. Make sure you discuss any questions you have with your health care provider.   Document Released: 08/08/2000 Document Revised: 03/05/2014 Document Reviewed: 07/10/2010 Elsevier Interactive Patient Education 2016 Elsevier Inc.  Urinary Tract Infection A urinary tract infection (UTI) can occur  any place along the urinary tract. The tract includes the kidneys, ureters, bladder, and urethra. A type of germ called bacteria often causes a UTI. UTIs are often helped with antibiotic medicine.  HOME CARE   If given, take antibiotics as told by your doctor. Finish them even if you start to feel better.  Drink enough fluids to keep your pee (urine) clear or pale yellow.  Avoid tea, drinks with caffeine, and bubbly  (carbonated) drinks.  Pee often. Avoid holding your pee in for a long time.  Pee before and after having sex (intercourse).  Wipe from front to back after you poop (bowel movement) if you are a woman. Use each tissue only once. GET HELP RIGHT AWAY IF:   You have back pain.  You have lower belly (abdominal) pain.  You have chills.  You feel sick to your stomach (nauseous).  You throw up (vomit).  Your burning or discomfort with peeing does not go away.  You have a fever.  Your symptoms are not better in 3 days. MAKE SURE YOU:   Understand these instructions.  Will watch your condition.  Will get help right away if you are not doing well or get worse.   This information is not intended to replace advice given to you by your health care provider. Make sure you discuss any questions you have with your health care provider.   Document Released: 08/01/2007 Document Revised: 03/05/2014 Document Reviewed: 09/13/2011 Elsevier Interactive Patient Education [employer].

## 2015-06-27 NOTE — NC FL2 (Signed)
[place] LEVEL OF CARE SCREENING TOOL     IDENTIFICATION  Patient Name: Kaitlyn Simmons Birthdate: 1945-12-18 Sex: female Admission Date (Current Location): 06/20/2015  [facility] and [place] Number:  Engineering geologist and Address:  [facility], [address] [place], [place] [address]      Provider Number: [id number]  Attending Physician Name and Address:  No att. providers found  Relative Name and Phone Number:       Current Level of Care: Hospital Recommended Level of Care: Memory Care, Other (Comment) (Secured Unit) Prior Approval Number:    Date Approved/Denied:   PASRR Number: [id number]  Discharge Plan: Other (Comment) (Memory Care, Secured Unit)    Current Diagnoses: Patient Active Problem List   Diagnosis Date Noted  . Dementia with behavioral disturbance 06/21/2015  . Dementia 03/29/2015  . Body mass index 40.0-44.9, adult ([place]) 12/17/2014  . Morbid obesity ([place]) 12/17/2014  . Depression 09/17/2014  . Calculus of bile duct without mention of cholecystitis or obstruction 10/08/2012  . Abnormal cholangiogram 10/08/2012  . Mucosal abnormality of duodenum 10/08/2012  . Abnormal LFTs (liver function tests) 09/12/2012  . Abdominal pain, epigastric 09/12/2012  . Non-STEMI (non-ST elevated myocardial infarction) ([place]) 09/11/2012  . CAD (coronary artery disease)   . Myocardial infarction ([place])   . Coronary artery disease   . CHF (congestive heart failure) ([place])   . HTN (hypertension)   . Hyperlipidemia   . Ischemic cardiomyopathy     Orientation RESPIRATION BLADDER Height & Weight     Self  Normal Continent Weight: 197 lb (89.359 kg) Height:  5\' 3"  (160 cm)  BEHAVIORAL SYMPTOMS/MOOD NEUROLOGICAL BOWEL NUTRITION STATUS  Wanderer   Continent Diet (Regular Diet)  AMBULATORY STATUS COMMUNICATION OF NEEDS Skin   Supervision Verbally Normal                       Personal Care Assistance Level of Assistance   Bathing, Feeding, Dressing Bathing Assistance: Limited assistance Feeding assistance: Independent Dressing Assistance: Limited assistance Total Care Assistance: Limited assistance   Functional Limitations Info  Sight, Hearing, Speech Sight Info: Adequate Hearing Info: Adequate Speech Info: Adequate    SPECIAL CARE FACTORS FREQUENCY  PT (By licensed PT)     PT Frequency: 5              Contractures      Additional Factors Info  Code Status, Allergies, Psychotropic Code Status Info: Full Code Allergies Info: No known allergies Psychotropic Info: Medications         Current Medications (06/27/2015):  This is the current hospital active medication list Current Facility-Administered Medications  Medication Dose Route Frequency Provider Last Rate Last Dose  . aspirin EC tablet 81 mg  81 mg Oral Daily Gonzella Lex, MD   81 mg at 06/27/15 1047  . atorvastatin (LIPITOR) tablet 80 mg  80 mg Oral q1800 Gonzella Lex, MD   80 mg at 06/26/15 1831  . clopidogrel (PLAVIX) tablet 75 mg  75 mg Oral Daily Gonzella Lex, MD   75 mg at 06/27/15 1039  . FLUoxetine (PROZAC) capsule 20 mg  20 mg Oral Daily Gonzella Lex, MD   20 mg at 06/27/15 1039  . fosfomycin (MONUROL) packet 3 g  3 g Oral Once Carrie Mew, MD      . furosemide (LASIX) tablet 40 mg  40 mg Oral Daily Gonzella Lex, MD   40 mg at  06/27/15 1046  . lisinopril (PRINIVIL,ZESTRIL) tablet 2.5 mg  2.5 mg Oral Daily Sylvan Cheese, MD   2.5 mg at 06/27/15 1047  . nitroGLYCERIN (NITROSTAT) SL tablet 0.4 mg  0.4 mg Sublingual Q5 min PRN Gonzella Lex, MD      . risperiDONE (RISPERDAL M-TABS) disintegrating tablet 0.25 mg  0.25 mg Oral Q6H PRN Gonzella Lex, MD   0.25 mg at 06/24/15 2240  . risperiDONE (RISPERDAL M-TABS) disintegrating tablet 0.5 mg  0.5 mg Oral BID AC Gonzella Lex, MD   0.5 mg at 06/26/15 1832   Current Outpatient Prescriptions  Medication Sig Dispense Refill  . aspirin EC 81 MG tablet Take 1 tablet  (81 mg total) by mouth as needed. 30 tablet 1  . atorvastatin (LIPITOR) 80 MG tablet Take 1 tablet (80 mg total) by mouth at bedtime. 30 tablet 1  . clopidogrel (PLAVIX) 75 MG tablet Take 1 tablet (75 mg total) by mouth daily. 30 tablet 1  . FLUoxetine (PROZAC) 20 MG capsule TAKE 1 CAPSULE BY MOUTH ONCE EVERY MORNING 30 capsule 1  . furosemide (LASIX) 40 MG tablet Take 1 tablet (40 mg total) by mouth every morning. 30 tablet 1  . lisinopril (ZESTRIL) 2.5 MG tablet Take 1 tablet (2.5 mg total) by mouth daily. 30 tablet 1  . nitroGLYCERIN (NITROSTAT) 0.4 MG SL tablet Place 1 tablet (0.4 mg total) under the tongue every 5 (five) minutes as needed for chest pain. 30 tablet 2  . risperiDONE (RISPERDAL) 0.25 MG tablet Take 1 tablet (0.25 mg total) by mouth every 6 (six) hours as needed (agitation). 60 tablet 1  . risperiDONE (RISPERDAL) 0.5 MG tablet Take 1 tablet (0.5 mg total) by mouth 2 (two) times daily. 60 tablet 1     Discharge Medications: Please see discharge summary for a list of discharge medications.   aspirin EC 81 MG tablet   Oral   Take 1 tablet (81 mg total) by mouth as needed.   30 tablet   1   . atorvastatin (LIPITOR) 80 MG tablet   Oral   Take 1 tablet (80 mg total) by mouth at bedtime.   30 tablet   1   . clopidogrel (PLAVIX) 75 MG tablet   Oral   Take 1 tablet (75 mg total) by mouth daily.   30 tablet   1   . FLUoxetine (PROZAC) 20 MG capsule      TAKE 1 CAPSULE BY MOUTH ONCE EVERY MORNING   30 capsule   1   . furosemide (LASIX) 40 MG tablet   Oral   Take 1 tablet (40 mg total) by mouth every morning.   30 tablet   1   . lisinopril (ZESTRIL) 2.5 MG tablet   Oral   Take 1 tablet (2.5 mg total) by mouth daily.   30 tablet   1   . nitroGLYCERIN (NITROSTAT) 0.4 MG SL tablet   Sublingual   Place 1 tablet (0.4 mg total) under the tongue every 5 (five) minutes as needed for chest pain.   30  tablet   2   . risperiDONE (RISPERDAL) 0.25 MG tablet   Oral   Take 1 tablet (0.25 mg total) by mouth every 6 (six) hours as needed (agitation).   60 tablet   1   . risperiDONE (RISPERDAL) 0.5 MG tablet   Oral   Take 1 tablet (0.5 mg total) by mouth 2 (two) times daily.   60 tablet   1  Relevant Imaging Results:  Relevant Lab Results:   Additional Information SSN:  [id number]  Darden Dates, LCSW

## 2015-06-27 NOTE — ED Provider Notes (Signed)
-----------------------------------------   6:55 AM on 06/27/2015 -----------------------------------------   Blood pressure 101/54, pulse 59, temperature 98.3 F (36.8 C), temperature source Oral, resp. rate 18, height 5\' 3"  (1.6 m), weight 197 lb (89.359 kg), SpO2 98 %.  The patient had no acute events since last update.  She was awake most of the night resting in her bed and folding hand towels. Calm and cooperative at this time.  Disposition is pending per Psychiatry/Behavioral Medicine team recommendations.     Paulette Blanch, MD 06/27/15 [phone]

## 2015-07-07 ENCOUNTER — Encounter: Payer: Self-pay | Admitting: Psychiatry

## 2015-07-07 NOTE — Progress Notes (Signed)
Patient ID: LENNIS DYNES, female   DOB: [birth year], 70 y.o.   MRN: [id number] This is a patient whom I saw in the emergency room around the end of April. A representative from hospital social work asked me today if I would provide an opinion as to whether this patient would benefit from guardianship based on my encounter with her. I am told that her son is petitioning for guardianship. Based on my evaluation of the patient in our emergency room I find that she is demented and likely to remain chronically impaired with chronically impaired ability to care for herself safely. I think she would very much benefit from having guardianship assigned.  Stacie Templin T Rosser [place] M.D.

## 2015-07-08 NOTE — Progress Notes (Signed)
Clinical Education officer, museum (Kaitlyn Simmons) received a call from [place] with Adult Scientist, forensic (Kaitlyn Simmons) in [place] on 07/07/15. Mikki Santee requested a statement from the psychiatrist about the benefit of patient having a guardian. Per Mikki Santee patient's son has petitioned  for guardianship and is going to court next week. Psychiatrist did write a note stating why patient would benefit from a guardian. CSW faxed psychiatrist note to [facility] at [place].   Blima Rich, LCSW [phone]

## 2015-07-12 ENCOUNTER — Telehealth: Payer: Self-pay | Admitting: Family Medicine

## 2015-07-12 NOTE — Telephone Encounter (Signed)
Sonya- note written and it is in my in basket in office.  If you could transcribe for my review on Thurs, then can be copied to letterhead.  Thanks.-jh

## 2015-07-12 NOTE — Telephone Encounter (Signed)
Pt. Daughter Santiago Glad called states that pt. Mother need a letter about her condition ( court hearing) on  07/19/15. Santiago Glad  Call back # is  [phone]

## 2015-07-13 ENCOUNTER — Encounter: Payer: Self-pay | Admitting: *Deleted

## 2015-07-13 NOTE — Telephone Encounter (Signed)
Done

## 2015-07-14 ENCOUNTER — Telehealth: Payer: Self-pay | Admitting: *Deleted

## 2015-07-14 NOTE — Telephone Encounter (Signed)
Called Kaitlyn Simmons and let her know letter is ready for pickup. She will come by on 07/15/15 to pick up.

## 2015-07-21 DIAGNOSIS — R69 Illness, unspecified: Secondary | ICD-10-CM | POA: Diagnosis not present

## 2015-07-21 DIAGNOSIS — G301 Alzheimer's disease with late onset: Secondary | ICD-10-CM | POA: Diagnosis not present

## 2015-07-21 DIAGNOSIS — I10 Essential (primary) hypertension: Secondary | ICD-10-CM | POA: Diagnosis not present

## 2015-07-26 DIAGNOSIS — Z961 Presence of intraocular lens: Secondary | ICD-10-CM | POA: Diagnosis not present

## 2015-07-26 DIAGNOSIS — H3530 Unspecified macular degeneration: Secondary | ICD-10-CM | POA: Diagnosis not present

## 2015-07-26 DIAGNOSIS — H2511 Age-related nuclear cataract, right eye: Secondary | ICD-10-CM | POA: Diagnosis not present

## 2015-07-26 DIAGNOSIS — H26492 Other secondary cataract, left eye: Secondary | ICD-10-CM | POA: Diagnosis not present

## 2015-08-03 DIAGNOSIS — I10 Essential (primary) hypertension: Secondary | ICD-10-CM | POA: Diagnosis not present

## 2015-08-03 DIAGNOSIS — D649 Anemia, unspecified: Secondary | ICD-10-CM | POA: Diagnosis not present

## 2015-08-03 DIAGNOSIS — E559 Vitamin D deficiency, unspecified: Secondary | ICD-10-CM | POA: Diagnosis not present

## 2015-08-30 ENCOUNTER — Emergency Department (HOSPITAL_COMMUNITY)
Admission: EM | Admit: 2015-08-30 | Discharge: 2015-08-30 | Disposition: A | Discharge status: Home / Self Care | Payer: Medicare HMO | Attending: Emergency Medicine | Admitting: Emergency Medicine

## 2015-08-30 ENCOUNTER — Encounter (HOSPITAL_COMMUNITY): Payer: Self-pay | Admitting: *Deleted

## 2015-08-30 DIAGNOSIS — F0390 Unspecified dementia without behavioral disturbance: Secondary | ICD-10-CM | POA: Diagnosis present

## 2015-08-30 DIAGNOSIS — I5022 Chronic systolic (congestive) heart failure: Secondary | ICD-10-CM | POA: Insufficient documentation

## 2015-08-30 DIAGNOSIS — I110 Hypertensive heart disease with heart failure: Secondary | ICD-10-CM | POA: Diagnosis not present

## 2015-08-30 DIAGNOSIS — S0990XA Unspecified injury of head, initial encounter: Secondary | ICD-10-CM | POA: Diagnosis not present

## 2015-08-30 DIAGNOSIS — Z7982 Long term (current) use of aspirin: Secondary | ICD-10-CM | POA: Diagnosis not present

## 2015-08-30 DIAGNOSIS — Z79899 Other long term (current) drug therapy: Secondary | ICD-10-CM | POA: Diagnosis not present

## 2015-08-30 DIAGNOSIS — Z87891 Personal history of nicotine dependence: Secondary | ICD-10-CM | POA: Insufficient documentation

## 2015-08-30 DIAGNOSIS — I252 Old myocardial infarction: Secondary | ICD-10-CM | POA: Diagnosis not present

## 2015-08-30 DIAGNOSIS — N390 Urinary tract infection, site not specified: Secondary | ICD-10-CM | POA: Insufficient documentation

## 2015-08-30 DIAGNOSIS — F0391 Unspecified dementia with behavioral disturbance: Secondary | ICD-10-CM

## 2015-08-30 DIAGNOSIS — E785 Hyperlipidemia, unspecified: Secondary | ICD-10-CM | POA: Diagnosis not present

## 2015-08-30 DIAGNOSIS — I2510 Atherosclerotic heart disease of native coronary artery without angina pectoris: Secondary | ICD-10-CM | POA: Diagnosis not present

## 2015-08-30 DIAGNOSIS — R4182 Altered mental status, unspecified: Secondary | ICD-10-CM | POA: Diagnosis not present

## 2015-08-30 LAB — CBC WITH DIFFERENTIAL/PLATELET
BASOS ABS: 0 10*3/uL (ref 0.0–0.1)
Basophils Relative: 1 %
EOS ABS: 0.1 10*3/uL (ref 0.0–0.7)
Eosinophils Relative: 2 %
HCT: 36.1 % (ref 36.0–46.0)
HEMOGLOBIN: 12 g/dL (ref 12.0–15.0)
LYMPHS ABS: 1.5 10*3/uL (ref 0.7–4.0)
LYMPHS PCT: 21 %
MCH: 30.5 pg (ref 26.0–34.0)
MCHC: 33.2 g/dL (ref 30.0–36.0)
MCV: 91.6 fL (ref 78.0–100.0)
Monocytes Absolute: 0.7 10*3/uL (ref 0.1–1.0)
Monocytes Relative: 10 %
NEUTROS PCT: 66 %
Neutro Abs: 4.5 10*3/uL (ref 1.7–7.7)
PLATELETS: 199 10*3/uL (ref 150–400)
RBC: 3.94 MIL/uL (ref 3.87–5.11)
RDW: 14.6 % (ref 11.5–15.5)
WBC: 6.8 10*3/uL (ref 4.0–10.5)

## 2015-08-30 LAB — COMPREHENSIVE METABOLIC PANEL
ALBUMIN: 3.6 g/dL (ref 3.5–5.0)
ALT: 19 U/L (ref 14–54)
AST: 24 U/L (ref 15–41)
Alkaline Phosphatase: 68 U/L (ref 38–126)
Anion gap: 7 (ref 5–15)
BUN: 24 mg/dL — AB (ref 6–20)
CHLORIDE: 103 mmol/L (ref 101–111)
CO2: 29 mmol/L (ref 22–32)
Calcium: 9.3 mg/dL (ref 8.9–10.3)
Creatinine, Ser: 1.04 mg/dL — ABNORMAL HIGH (ref 0.44–1.00)
GFR calc Af Amer: >60 mL/min (ref >60)
GFR calc non Af Amer: 53 mL/min — ABNORMAL LOW (ref >60)
GLUCOSE: 96 mg/dL (ref 65–99)
POTASSIUM: 4 mmol/L (ref 3.5–5.1)
Sodium: 139 mmol/L (ref 135–145)
Total Bilirubin: 0.4 mg/dL (ref 0.3–1.2)
Total Protein: 6.4 g/dL — ABNORMAL LOW (ref 6.5–8.1)

## 2015-08-30 LAB — URINALYSIS, ROUTINE W REFLEX MICROSCOPIC
Bilirubin Urine: NEGATIVE
GLUCOSE, UA: NEGATIVE mg/dL
Ketones, ur: NEGATIVE mg/dL
Nitrite: POSITIVE — AB
Protein, ur: NEGATIVE mg/dL
SPECIFIC GRAVITY, URINE: 1.019 (ref 1.005–1.030)
pH: 6 (ref 5.0–8.0)

## 2015-08-30 LAB — URINE MICROSCOPIC-ADD ON

## 2015-08-30 LAB — RAPID URINE DRUG SCREEN, HOSP PERFORMED
AMPHETAMINES: NOT DETECTED
BARBITURATES: NOT DETECTED
BENZODIAZEPINES: NOT DETECTED
Cocaine: NOT DETECTED
Opiates: NOT DETECTED
Tetrahydrocannabinol: NOT DETECTED

## 2015-08-30 LAB — ETHANOL: Alcohol, Ethyl (B): <5 mg/dL (ref <5)

## 2015-08-30 MED ORDER — LIDOCAINE HCL 1 % IJ SOLN
INTRAMUSCULAR | Status: AC
  Administered 2015-08-30: 20 mL
  Filled 2015-08-30: qty 20

## 2015-08-30 MED ORDER — CEPHALEXIN 500 MG PO CAPS: 500.0000 mg | ORAL_CAPSULE | Freq: Four times a day (QID) | ORAL | Status: DC

## 2015-08-30 MED ORDER — CEFTRIAXONE SODIUM 1 G IJ SOLR
1.0000 g | Freq: Once | INTRAMUSCULAR | Status: AC
  Administered 2015-08-30: 1 g via INTRAMUSCULAR
  Filled 2015-08-30: qty 10
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
## 2015-08-30 NOTE — Discharge Instructions (Signed)
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
## 2015-09-01 LAB — URINE CULTURE

## 2015-09-02 ENCOUNTER — Telehealth: Payer: Self-pay | Admitting: *Deleted

## 2015-09-02 NOTE — ED Notes (Signed)
Post ED Visit - Positive Culture Follow-up  Culture report reviewed by antimicrobial stewardship pharmacist:  []  Kaitlyn Simmons, Pharm.D. []  Kaitlyn Simmons, Pharm.D., BCPS [x]  Kaitlyn Simmons, Pharm.D. []  Kaitlyn Simmons, Pharm.D., BCPS []  Kaitlyn Simmons, Pharm.D., BCPS, AAHIVP []  Kaitlyn Simmons, Pharm.D., BCPS, AAHIVP []  Kaitlyn Simmons, Pharm.D. []  Kaitlyn Simmons, [place].D.  Positive urine culture Treated with Cephalexin, organism sensitive to the same and no further patient follow-up is required at this time.  Kaitlyn Simmons [facility] 09/02/2015, 9:03 AM

## 2015-09-08 DIAGNOSIS — G301 Alzheimer's disease with late onset: Secondary | ICD-10-CM | POA: Diagnosis not present

## 2015-09-08 DIAGNOSIS — N390 Urinary tract infection, site not specified: Secondary | ICD-10-CM | POA: Diagnosis not present

## 2015-09-08 DIAGNOSIS — R69 Illness, unspecified: Secondary | ICD-10-CM | POA: Diagnosis not present

## 2015-09-12 ENCOUNTER — Encounter (HOSPITAL_COMMUNITY): Payer: Self-pay | Admitting: Emergency Medicine

## 2015-09-12 ENCOUNTER — Emergency Department (HOSPITAL_COMMUNITY)
Admission: EM | Admit: 2015-09-12 | Discharge: 2015-09-12 | Disposition: A | Discharge status: Home / Self Care | Payer: Medicare HMO | Attending: Emergency Medicine | Admitting: Emergency Medicine

## 2015-09-12 DIAGNOSIS — I5022 Chronic systolic (congestive) heart failure: Secondary | ICD-10-CM | POA: Insufficient documentation

## 2015-09-12 DIAGNOSIS — I252 Old myocardial infarction: Secondary | ICD-10-CM | POA: Diagnosis not present

## 2015-09-12 DIAGNOSIS — Z87891 Personal history of nicotine dependence: Secondary | ICD-10-CM | POA: Diagnosis not present

## 2015-09-12 DIAGNOSIS — Z79899 Other long term (current) drug therapy: Secondary | ICD-10-CM | POA: Insufficient documentation

## 2015-09-12 DIAGNOSIS — I110 Hypertensive heart disease with heart failure: Secondary | ICD-10-CM | POA: Diagnosis not present

## 2015-09-12 DIAGNOSIS — F0390 Unspecified dementia without behavioral disturbance: Secondary | ICD-10-CM | POA: Insufficient documentation

## 2015-09-12 DIAGNOSIS — R4182 Altered mental status, unspecified: Secondary | ICD-10-CM | POA: Diagnosis present

## 2015-09-12 DIAGNOSIS — Z7982 Long term (current) use of aspirin: Secondary | ICD-10-CM | POA: Insufficient documentation

## 2015-09-12 DIAGNOSIS — I2510 Atherosclerotic heart disease of native coronary artery without angina pectoris: Secondary | ICD-10-CM | POA: Diagnosis not present

## 2015-09-12 DIAGNOSIS — Y92129 Unspecified place in nursing home as the place of occurrence of the external cause: Secondary | ICD-10-CM | POA: Diagnosis not present

## 2015-09-12 DIAGNOSIS — E785 Hyperlipidemia, unspecified: Secondary | ICD-10-CM | POA: Insufficient documentation

## 2015-09-12 DIAGNOSIS — W19XXXA Unspecified fall, initial encounter: Secondary | ICD-10-CM | POA: Insufficient documentation

## 2015-09-12 DIAGNOSIS — Y939 Activity, unspecified: Secondary | ICD-10-CM | POA: Diagnosis not present

## 2015-09-12 DIAGNOSIS — Y999 Unspecified external cause status: Secondary | ICD-10-CM | POA: Diagnosis not present

## 2015-09-12 DIAGNOSIS — I255 Ischemic cardiomyopathy: Secondary | ICD-10-CM | POA: Diagnosis not present

## 2015-09-12 DIAGNOSIS — R402441 Other coma, without documented Glasgow coma scale score, or with partial score reported, in the field [EMT or ambulance]: Secondary | ICD-10-CM | POA: Diagnosis not present

## 2015-09-12 LAB — I-STAT CHEM 8, ED
BUN: 18 mg/dL (ref 6–20)
CALCIUM ION: 1.19 mmol/L (ref 1.12–1.23)
CHLORIDE: 103 mmol/L (ref 101–111)
Creatinine, Ser: 1.1 mg/dL — ABNORMAL HIGH (ref 0.44–1.00)
GLUCOSE: 88 mg/dL (ref 65–99)
HCT: 34 % — ABNORMAL LOW (ref 36.0–46.0)
Hemoglobin: 11.6 g/dL — ABNORMAL LOW (ref 12.0–15.0)
Potassium: 3.9 mmol/L (ref 3.5–5.1)
Sodium: 141 mmol/L (ref 135–145)
TCO2: 26 mmol/L (ref 0–100)

## 2015-09-12 LAB — URINALYSIS, ROUTINE W REFLEX MICROSCOPIC
Bilirubin Urine: NEGATIVE
GLUCOSE, UA: NEGATIVE mg/dL
HGB URINE DIPSTICK: NEGATIVE
KETONES UR: NEGATIVE mg/dL
LEUKOCYTES UA: NEGATIVE
Nitrite: NEGATIVE
PH: 5 (ref 5.0–8.0)
PROTEIN: NEGATIVE mg/dL
Specific Gravity, Urine: 1.011 (ref 1.005–1.030)

## 2015-09-12 NOTE — Discharge Instructions (Signed)

## 2015-09-12 NOTE — ED Provider Notes (Signed)
CSN: [id number]     Arrival date & time 09/12/15  1256 History   First MD Initiated Contact with Patient 09/12/15 1303     No chief complaint on file.    (Consider location/radiation/quality/duration/timing/severity/associated sxs/prior Treatment) HPI Comments: 70 year old female presents from nursing home after being found sitting down in a room. Patient does have a baseline history of dementia and can't communicate. Patient seen by myself on July 4 and treated for UTI. Was called for results reviewed and she received appropriate antibiotic therapy. Patient herself denies any pain at this time. States that she just sat down. Denies any pain in her back or hips. Denies any head or neck or chest or abdominal discomfort. EMS called and patient transported here.  The history is provided by the patient and the police. The history is limited by the condition of the patient.    Past Medical History  Diagnosis Date  . CAD (coronary artery disease)     a. s/p IL STEMI 7/11 => tx with BMS to RCA (tx at [facility]);  b.  AL STEMI 7/12 => LHC: mRCA 100% ==> PCI with BMS to RCA; LM 10%, pCFX 50%, OM1 10%, pLAD 50%, mLAD 99%, 70%  ==>  c. staged PCI: BMS to  LAD (tx at [facility])  . Chronic systolic heart failure ([place])   . HTN (hypertension)   . Hyperlipidemia   . Ischemic cardiomyopathy     a. EF 30-35% at time of AL STEMI 7/12;   b. IMPROVED by f/u Echo 9/13:  mild LVH, EF 45-50%, mid to dist Inf, AS, Apical HK, Gr 1 diast dysfn, MAC, mod LAE  . Cataracts, bilateral   . CHF (congestive heart failure) ([place])   . GERD (gastroesophageal reflux disease)   . STEMI (ST elevation myocardial infarction) ([place]) 2011; 2012    [place]; [place]  . Poor short-term memory   . Dementia    Past Surgical History  Procedure Laterality Date  . Tonsillectomy and adenoidectomy  1980's  . Coronary angioplasty with stent placement  2011; 2012    RCA; RCA & LAD  . Dilation and curettage of uterus  1970's  . Cataract extraction w/  intraocular lens implant Left ~ 2008  . Cholecystectomy N/A 10/07/2012    Procedure: LAPAROSCOPIC CHOLECYSTECTOMY WITH INTRAOPERATIVE CHOLANGIOGRAM;  Surgeon: Shann Medal, MD;  Location: [facility];  Service: General;  Laterality: N/A;  . Ercp N/A 10/08/2012    Procedure: ENDOSCOPIC RETROGRADE CHOLANGIOPANCREATOGRAPHY (ERCP);  Surgeon: Gatha Mayer, MD;  Location: [facility];  Service: Endoscopy;  Laterality: N/A;  . Ercp w/ sphicterotomy  11/10/2012    stones removed [place]   Family History  Problem Relation Age of Onset  . Heart attack Father     DECEASED  . Stroke Father     DECEASED  . Leukemia Mother     DECEASED  . Heart attack Sister     ALIVE  . Hypertension Sister    Social History  Substance Use Topics  . Smoking status: Former Smoker -- 2.00 packs/day for 30 years    Types: Cigarettes    Quit date: 10/25/2002  . Smokeless tobacco: Never Used  . Alcohol Use: No   OB History    No data available     Review of Systems  Unable to perform ROS: Dementia      Allergies  Review of patient's allergies indicates no known allergies.  Home Medications   Prior to Admission medications   Medication Sig Start  Date End Date Taking? Authorizing Provider  acetaminophen (TYLENOL) 500 MG tablet Take 500 mg by mouth every 4 (four) hours as needed for moderate pain or headache.   Yes Historical Provider, MD  alum & mag hydroxide-simeth (MINTOX) 200-200-20 MG/5ML suspension Take 30 mLs by mouth every 6 (six) hours as needed for indigestion or heartburn.   Yes Historical Provider, MD  aspirin EC 81 MG tablet Take 1 tablet (81 mg total) by mouth as needed. Patient taking differently: Take 81 mg by mouth daily.  06/27/15  Yes Carrie Mew, MD  atorvastatin (LIPITOR) 80 MG tablet Take 1 tablet (80 mg total) by mouth at bedtime. 06/27/15  Yes Carrie Mew, MD  clonazePAM (KLONOPIN) 0.5 MG tablet Take 0.5 mg by mouth daily.   Yes Historical Provider, MD  clopidogrel (PLAVIX) 75  MG tablet Take 1 tablet (75 mg total) by mouth daily. 06/27/15  Yes Carrie Mew, MD  FLUoxetine (PROZAC) 10 MG capsule Take 30 mg by mouth daily.    Yes Historical Provider, MD  furosemide (LASIX) 40 MG tablet Take 1 tablet (40 mg total) by mouth every morning. 06/27/15  Yes Carrie Mew, MD  guaifenesin (ROBITUSSIN) 100 MG/5ML syrup Take 200 mg by mouth every 6 (six) hours as needed for cough.    Yes Historical Provider, MD  lisinopril (ZESTRIL) 2.5 MG tablet Take 1 tablet (2.5 mg total) by mouth daily. 06/27/15  Yes Carrie Mew, MD  loperamide (IMODIUM) 2 MG capsule Take 2 mg by mouth daily as needed for diarrhea or loose stools.   Yes Historical Provider, MD  magnesium hydroxide (MILK OF MAGNESIA) 400 MG/5ML suspension Take 30 mLs by mouth at bedtime as needed for moderate constipation.    Yes Historical Provider, MD  Neomycin-Bacitracin-Polymyxin (TRIPLE ANTIBIOTIC) [phone] OINT Apply 1 application topically as needed (wound care).   Yes Historical Provider, MD  nitroGLYCERIN (NITROSTAT) 0.4 MG SL tablet Place 1 tablet (0.4 mg total) under the tongue every 5 (five) minutes as needed for chest pain. 06/27/15  Yes Carrie Mew, MD  risperiDONE (RISPERDAL) 0.25 MG tablet Take 1 tablet (0.25 mg total) by mouth every 6 (six) hours as needed (agitation). 06/27/15  Yes Carrie Mew, MD  risperiDONE (RISPERDAL) 0.5 MG tablet Take 1 tablet (0.5 mg total) by mouth 2 (two) times daily. 06/27/15  Yes Carrie Mew, MD   SpO2 96% Physical Exam  Constitutional: She is oriented to person, place, and time. She appears well-developed and well-nourished.  Non-toxic appearance. No distress.  HENT:  Head: Normocephalic and atraumatic.  Eyes: Conjunctivae, EOM and lids are normal. Pupils are equal, round, and reactive to light.  Neck: Normal range of motion. Neck supple. No tracheal deviation present. No thyroid mass present.  Cardiovascular: Normal rate, regular rhythm and normal heart sounds.   Exam reveals no gallop.   No murmur heard. Pulmonary/Chest: Effort normal and breath sounds normal. No stridor. No respiratory distress. She has no decreased breath sounds. She has no wheezes. She has no rhonchi. She has no rales.  Abdominal: Soft. Normal appearance and bowel sounds are normal. She exhibits no distension. There is no tenderness. There is no rebound and no CVA tenderness.  Musculoskeletal: Normal range of motion. She exhibits no edema or tenderness.  Neurological: She is alert and oriented to person, place, and time. She has normal strength. No cranial nerve deficit or sensory deficit. GCS eye subscore is 4. GCS verbal subscore is 5. GCS motor subscore is 6.  Skin: Skin is warm and dry.  No abrasion and no rash noted.  Psychiatric: She has a normal mood and affect. Her speech is normal and behavior is normal.  Nursing note and vitals reviewed.   ED Course  Procedures (including critical care time) Labs Review Labs Reviewed  URINE CULTURE  URINALYSIS, ROUTINE W REFLEX MICROSCOPIC (NOT AT [facility])  I-STAT CHEM 8, ED    Imaging Review No results found. I have personally reviewed and evaluated these images and lab results as part of my medical decision-making.   EKG Interpretation   Date/Time:  Monday September 12 2015 13:34:06 EDT Ventricular Rate:  48 PR Interval:    QRS Duration: 117 QT Interval:  459 QTC Calculation: 411 R Axis:   21 Text Interpretation:  Sinus bradycardia Nonspecific intraventricular  conduction delay Abnormal inferior Q waves Probable anterior infarct, age  indeterminate No significant change since last tracing Reconfirmed by  Sanford Lindblad  MD, Addilee Neu ([id number]) on 09/12/2015 1:49:53 PM      MDM   Final diagnoses:  None    Patient's blood pressure noted and she is not symptomatically at this time. Does not appear to be orthostatic. Patient's repeat urinalysis is negative for infection. Stable for discharged back to nursing home    Lacretia Leigh,  MD 09/12/15 1444

## 2015-09-12 NOTE — ED Notes (Signed)
Pt comes in today with EMS from [facility]. Per EMS and facility, pt was found on the flood by staff. It is unclear whether pt fell.  Pt does have dementia and per facility pt is impaired when asking appropriate questions. Pt was just seen here on 7/4 and treated for UTI. Treatment ended last week. Pt denies any pain.

## 2015-09-13 LAB — URINE CULTURE: CULTURE: NO GROWTH

## 2015-09-14 ENCOUNTER — Emergency Department (HOSPITAL_COMMUNITY)
Admission: EM | Admit: 2015-09-14 | Discharge: 2015-09-14 | Disposition: A | Discharge status: Home / Self Care | Payer: Medicare HMO | Attending: Emergency Medicine | Admitting: Emergency Medicine

## 2015-09-14 ENCOUNTER — Emergency Department (HOSPITAL_COMMUNITY): Payer: Medicare HMO

## 2015-09-14 ENCOUNTER — Encounter (HOSPITAL_COMMUNITY): Payer: Self-pay | Admitting: *Deleted

## 2015-09-14 DIAGNOSIS — I252 Old myocardial infarction: Secondary | ICD-10-CM | POA: Insufficient documentation

## 2015-09-14 DIAGNOSIS — W19XXXA Unspecified fall, initial encounter: Secondary | ICD-10-CM | POA: Diagnosis not present

## 2015-09-14 DIAGNOSIS — R1032 Left lower quadrant pain: Secondary | ICD-10-CM | POA: Insufficient documentation

## 2015-09-14 DIAGNOSIS — Y9289 Other specified places as the place of occurrence of the external cause: Secondary | ICD-10-CM | POA: Diagnosis not present

## 2015-09-14 DIAGNOSIS — I2510 Atherosclerotic heart disease of native coronary artery without angina pectoris: Secondary | ICD-10-CM | POA: Insufficient documentation

## 2015-09-14 DIAGNOSIS — F0390 Unspecified dementia without behavioral disturbance: Secondary | ICD-10-CM | POA: Diagnosis not present

## 2015-09-14 DIAGNOSIS — Y939 Activity, unspecified: Secondary | ICD-10-CM | POA: Diagnosis not present

## 2015-09-14 DIAGNOSIS — S199XXA Unspecified injury of neck, initial encounter: Secondary | ICD-10-CM | POA: Diagnosis not present

## 2015-09-14 DIAGNOSIS — T148 Other injury of unspecified body region: Secondary | ICD-10-CM | POA: Diagnosis not present

## 2015-09-14 DIAGNOSIS — S0990XA Unspecified injury of head, initial encounter: Secondary | ICD-10-CM | POA: Diagnosis not present

## 2015-09-14 DIAGNOSIS — Z7982 Long term (current) use of aspirin: Secondary | ICD-10-CM | POA: Insufficient documentation

## 2015-09-14 DIAGNOSIS — Y999 Unspecified external cause status: Secondary | ICD-10-CM | POA: Diagnosis not present

## 2015-09-14 DIAGNOSIS — Z955 Presence of coronary angioplasty implant and graft: Secondary | ICD-10-CM | POA: Insufficient documentation

## 2015-09-14 DIAGNOSIS — R69 Illness, unspecified: Secondary | ICD-10-CM | POA: Diagnosis not present

## 2015-09-14 DIAGNOSIS — I5022 Chronic systolic (congestive) heart failure: Secondary | ICD-10-CM | POA: Insufficient documentation

## 2015-09-14 DIAGNOSIS — M542 Cervicalgia: Secondary | ICD-10-CM | POA: Diagnosis not present

## 2015-09-14 DIAGNOSIS — I639 Cerebral infarction, unspecified: Secondary | ICD-10-CM | POA: Insufficient documentation

## 2015-09-14 DIAGNOSIS — Z87891 Personal history of nicotine dependence: Secondary | ICD-10-CM | POA: Diagnosis not present

## 2015-09-14 DIAGNOSIS — I110 Hypertensive heart disease with heart failure: Secondary | ICD-10-CM | POA: Diagnosis not present

## 2015-09-14 DIAGNOSIS — Z79899 Other long term (current) drug therapy: Secondary | ICD-10-CM | POA: Insufficient documentation

## 2015-09-14 DIAGNOSIS — R4182 Altered mental status, unspecified: Secondary | ICD-10-CM | POA: Diagnosis not present

## 2015-09-14 DIAGNOSIS — M79604 Pain in right leg: Secondary | ICD-10-CM | POA: Diagnosis not present

## 2015-09-14 LAB — BASIC METABOLIC PANEL
Anion gap: 5 (ref 5–15)
BUN: 18 mg/dL (ref 6–20)
CALCIUM: 9.2 mg/dL (ref 8.9–10.3)
CHLORIDE: 108 mmol/L (ref 101–111)
CO2: 27 mmol/L (ref 22–32)
CREATININE: 1.07 mg/dL — AB (ref 0.44–1.00)
GFR calc non Af Amer: 51 mL/min — ABNORMAL LOW (ref >60)
GFR, EST AFRICAN AMERICAN: 59 mL/min — AB (ref >60)
Glucose, Bld: 90 mg/dL (ref 65–99)
Potassium: 3.7 mmol/L (ref 3.5–5.1)
Sodium: 140 mmol/L (ref 135–145)

## 2015-09-14 LAB — CBC WITH DIFFERENTIAL/PLATELET
BASOS PCT: 0 %
Basophils Absolute: 0 10*3/uL (ref 0.0–0.1)
EOS ABS: 0.1 10*3/uL (ref 0.0–0.7)
EOS PCT: 1 %
HCT: 34.8 % — ABNORMAL LOW (ref 36.0–46.0)
Hemoglobin: 11.1 g/dL — ABNORMAL LOW (ref 12.0–15.0)
LYMPHS ABS: 1.5 10*3/uL (ref 0.7–4.0)
Lymphocytes Relative: 17 %
MCH: 29.8 pg (ref 26.0–34.0)
MCHC: 31.9 g/dL (ref 30.0–36.0)
MCV: 93.5 fL (ref 78.0–100.0)
MONO ABS: 0.6 10*3/uL (ref 0.1–1.0)
MONOS PCT: 6 %
Neutro Abs: 6.7 10*3/uL (ref 1.7–7.7)
Neutrophils Relative %: 76 %
PLATELETS: 194 10*3/uL (ref 150–400)
RBC: 3.72 MIL/uL — ABNORMAL LOW (ref 3.87–5.11)
RDW: 14.7 % (ref 11.5–15.5)
WBC: 8.9 10*3/uL (ref 4.0–10.5)

## 2015-09-14 LAB — URINALYSIS, ROUTINE W REFLEX MICROSCOPIC
BILIRUBIN URINE: NEGATIVE
Glucose, UA: NEGATIVE mg/dL
HGB URINE DIPSTICK: NEGATIVE
KETONES UR: NEGATIVE mg/dL
Leukocytes, UA: NEGATIVE
NITRITE: NEGATIVE
PROTEIN: NEGATIVE mg/dL
SPECIFIC GRAVITY, URINE: 1.014 (ref 1.005–1.030)
pH: 5 (ref 5.0–8.0)

## 2015-09-14 NOTE — ED Provider Notes (Signed)
CSN: [id number]     Arrival date & time 09/14/15  1208 History   First MD Initiated Contact with Patient 09/14/15 1333     Chief Complaint  Patient presents with  . Fall     (Consider location/radiation/quality/duration/timing/severity/associated sxs/prior Treatment) The history is provided by the patient and medical records.    Pt with hx CAD, CHF, HTN, HLD, dementia on plavix brought in after found on ground at facility.  Pt denies pain, SOB, any complaints.  Unable to communicate much at baseline.  States "I haven't got it yet.  I haven't done anything" but is able to respond yes and no and short answers to basic questions.   Level V caveat dementia.   Past Medical History  Diagnosis Date  . CAD (coronary artery disease)     a. s/p IL STEMI 7/11 => tx with BMS to RCA (tx at [facility]);  b.  AL STEMI 7/12 => LHC: mRCA 100% ==> PCI with BMS to RCA; LM 10%, pCFX 50%, OM1 10%, pLAD 50%, mLAD 99%, 70%  ==>  c. staged PCI: BMS to  LAD (tx at [facility])  . Chronic systolic heart failure ([place])   . HTN (hypertension)   . Hyperlipidemia   . Ischemic cardiomyopathy     a. EF 30-35% at time of AL STEMI 7/12;   b. IMPROVED by f/u Echo 9/13:  mild LVH, EF 45-50%, mid to dist Inf, AS, Apical HK, Gr 1 diast dysfn, MAC, mod LAE  . Cataracts, bilateral   . CHF (congestive heart failure) ([place])   . GERD (gastroesophageal reflux disease)   . STEMI (ST elevation myocardial infarction) ([place]) 2011; 2012    [place]; [place]  . Poor short-term memory   . Dementia    Past Surgical History  Procedure Laterality Date  . Tonsillectomy and adenoidectomy  1980's  . Coronary angioplasty with stent placement  2011; 2012    RCA; RCA & LAD  . Dilation and curettage of uterus  1970's  . Cataract extraction w/ intraocular lens implant Left ~ 2008  . Cholecystectomy N/A 10/07/2012    Procedure: LAPAROSCOPIC CHOLECYSTECTOMY WITH INTRAOPERATIVE CHOLANGIOGRAM;  Surgeon: Shann Medal, MD;  Location: [facility];  Service: General;   Laterality: N/A;  . Ercp N/A 10/08/2012    Procedure: ENDOSCOPIC RETROGRADE CHOLANGIOPANCREATOGRAPHY (ERCP);  Surgeon: Gatha Mayer, MD;  Location: [facility];  Service: Endoscopy;  Laterality: N/A;  . Ercp w/ sphicterotomy  11/10/2012    stones removed [place]   Family History  Problem Relation Age of Onset  . Heart attack Father     DECEASED  . Stroke Father     DECEASED  . Leukemia Mother     DECEASED  . Heart attack Sister     ALIVE  . Hypertension Sister    Social History  Substance Use Topics  . Smoking status: Former Smoker -- 2.00 packs/day for 30 years    Types: Cigarettes    Quit date: 10/25/2002  . Smokeless tobacco: Never Used  . Alcohol Use: No   OB History    No data available     Review of Systems  Unable to perform ROS: Dementia      Allergies  Review of patient's allergies indicates no known allergies.  Home Medications   Prior to Admission medications   Medication Sig Start Date End Date Taking? Authorizing Provider  acetaminophen (TYLENOL) 500 MG tablet Take 500 mg by mouth every 4 (four) hours as needed for moderate pain or  headache.    Historical Provider, MD  alum & mag hydroxide-simeth ([place]) 200-200-20 MG/5ML suspension Take 30 mLs by mouth every 6 (six) hours as needed for indigestion or heartburn.    Historical Provider, MD  aspirin EC 81 MG tablet Take 1 tablet (81 mg total) by mouth as needed. Patient taking differently: Take 81 mg by mouth daily.  06/27/15   Carrie Mew, MD  atorvastatin (LIPITOR) 80 MG tablet Take 1 tablet (80 mg total) by mouth at bedtime. 06/27/15   Carrie Mew, MD  clonazePAM (KLONOPIN) 0.5 MG tablet Take 0.5 mg by mouth daily.    Historical Provider, MD  clopidogrel (PLAVIX) 75 MG tablet Take 1 tablet (75 mg total) by mouth daily. 06/27/15   Carrie Mew, MD  FLUoxetine (PROZAC) 10 MG capsule Take 30 mg by mouth daily.     Historical Provider, MD  furosemide (LASIX) 40 MG tablet Take 1 tablet (40 mg  total) by mouth every morning. 06/27/15   Carrie Mew, MD  guaifenesin (ROBITUSSIN) 100 MG/5ML syrup Take 200 mg by mouth every 6 (six) hours as needed for cough.     Historical Provider, MD  lisinopril (ZESTRIL) 2.5 MG tablet Take 1 tablet (2.5 mg total) by mouth daily. 06/27/15   Carrie Mew, MD  loperamide (IMODIUM) 2 MG capsule Take 2 mg by mouth daily as needed for diarrhea or loose stools.    Historical Provider, MD  magnesium hydroxide (MILK OF MAGNESIA) 400 MG/5ML suspension Take 30 mLs by mouth at bedtime as needed for moderate constipation.     Historical Provider, MD  Neomycin-Bacitracin-Polymyxin (TRIPLE ANTIBIOTIC) [phone] OINT Apply 1 application topically as needed (wound care).    Historical Provider, MD  nitroGLYCERIN (NITROSTAT) 0.4 MG SL tablet Place 1 tablet (0.4 mg total) under the tongue every 5 (five) minutes as needed for chest pain. 06/27/15   Carrie Mew, MD  risperiDONE (RISPERDAL) 0.25 MG tablet Take 1 tablet (0.25 mg total) by mouth every 6 (six) hours as needed (agitation). 06/27/15   Carrie Mew, MD  risperiDONE (RISPERDAL) 0.5 MG tablet Take 1 tablet (0.5 mg total) by mouth 2 (two) times daily. 06/27/15   Carrie Mew, MD   BP 100/61 mmHg  Pulse 81  Temp(Src) 98.7 F (37.1 C) (Oral)  Resp 20  SpO2 99% Physical Exam  Constitutional: She appears well-developed and well-nourished. No distress.  HENT:  Head: Normocephalic and atraumatic.  Eyes: Conjunctivae are normal.  Neck: Neck supple.  c-collar   Cardiovascular: Normal rate and regular rhythm.   Pulmonary/Chest: Effort normal and breath sounds normal. No respiratory distress. She has no wheezes. She has no rales.  Abdominal: Soft. She exhibits no distension. There is tenderness (LLQ). There is no rebound and no guarding.  Neurological: She is alert.  Oriented to self.  At doctor's office, unsure of city.  Doesn't know date or year.  Unknown president.    Moves all extremities equally.   Follows commands.    Skin: She is not diaphoretic.  Nursing note and vitals reviewed.   ED Course  Procedures (including critical care time) Labs Review Labs Reviewed  BASIC METABOLIC PANEL - Abnormal; Notable for the following:    Creatinine, Ser 1.07 (*)    GFR calc non Af Amer 51 (*)    GFR calc Af Amer 59 (*)    All other components within normal limits  CBC WITH DIFFERENTIAL/PLATELET - Abnormal; Notable for the following:    RBC 3.72 (*)    Hemoglobin 11.1 (*)  HCT 34.8 (*)    All other components within normal limits  URINALYSIS, ROUTINE W REFLEX MICROSCOPIC (NOT AT [facility])    Imaging Review Ct Head Wo Contrast  09/14/2015  CLINICAL DATA:  Pain following fall.  Altered mental status EXAM: CT HEAD WITHOUT CONTRAST CT CERVICAL SPINE WITHOUT CONTRAST TECHNIQUE: Multidetector CT imaging of the head and cervical spine was performed following the standard protocol without intravenous contrast. Multiplanar CT image reconstructions of the cervical spine were also generated. COMPARISON:  None. FINDINGS: CT HEAD FINDINGS There is mild diffuse atrophy. There is no intracranial mass, hemorrhage, extra-axial fluid collection, or midline shift. There is small vessel disease throughout the centra semiovale bilaterally. There is evidence of small vessel disease in the anterior limbs of both external capsules. There is also patchy small vessel disease in each thalamus region there is a small focus of decreased attenuation in the medial left occipital lobe, consistent with an age uncertain and possibly recent small infarct in this area. There are foci of vascular calcification in both vertebral arteries as well as in the carotid siphon and right cavernous carotid artery regions. A small focus of calcification is noted in the left middle cerebral artery, nonobstructing. No hyperdense vessel is seen. The bony calvarium appears intact. The mastoid air cells are clear. The visualized paranasal sinuses are  clear. No intraorbital lesions are evident. Patient has had previous cataract removal on the left. CT CERVICAL SPINE FINDINGS There is no fracture or spondylolisthesis. Prevertebral soft tissues and predental space regions are normal. There is moderate disc space narrowing at C5-6. Other disc spaces appear unremarkable. There is facet hypertrophy at most levels bilaterally. There is exit foraminal narrowing due to bony hypertrophy at C5-6 on the right with apparent impression on the exiting nerve root at this level. No disc extrusion or stenosis evident. There are foci of carotid and vertebral artery calcification. IMPRESSION: CT head: Atrophy with extensive periventricular small vessel disease. Age uncertain and possibly recent infarct in the medial left occipital lobe. No acute hemorrhage, mass, or extra-axial fluid collection. Foci of arterial vascular calcification noted. Previous cataract extraction on the left. CT cervical spine: No fracture or spondylolisthesis. Areas of osteoarthritic change, most marked at C5-6 on the right. There are multiple foci of arterial vascular calcifications/atherosclerosis. Electronically Signed   By: Lowella Grip III M.D.   On: 09/14/2015 14:56   Ct Cervical Spine Wo Contrast  09/14/2015  CLINICAL DATA:  Pain following fall.  Altered mental status EXAM: CT HEAD WITHOUT CONTRAST CT CERVICAL SPINE WITHOUT CONTRAST TECHNIQUE: Multidetector CT imaging of the head and cervical spine was performed following the standard protocol without intravenous contrast. Multiplanar CT image reconstructions of the cervical spine were also generated. COMPARISON:  None. FINDINGS: CT HEAD FINDINGS There is mild diffuse atrophy. There is no intracranial mass, hemorrhage, extra-axial fluid collection, or midline shift. There is small vessel disease throughout the centra semiovale bilaterally. There is evidence of small vessel disease in the anterior limbs of both external capsules. There is also  patchy small vessel disease in each thalamus region there is a small focus of decreased attenuation in the medial left occipital lobe, consistent with an age uncertain and possibly recent small infarct in this area. There are foci of vascular calcification in both vertebral arteries as well as in the carotid siphon and right cavernous carotid artery regions. A small focus of calcification is noted in the left middle cerebral artery, nonobstructing. No hyperdense vessel is seen. The bony calvarium  appears intact. The mastoid air cells are clear. The visualized paranasal sinuses are clear. No intraorbital lesions are evident. Patient has had previous cataract removal on the left. CT CERVICAL SPINE FINDINGS There is no fracture or spondylolisthesis. Prevertebral soft tissues and predental space regions are normal. There is moderate disc space narrowing at C5-6. Other disc spaces appear unremarkable. There is facet hypertrophy at most levels bilaterally. There is exit foraminal narrowing due to bony hypertrophy at C5-6 on the right with apparent impression on the exiting nerve root at this level. No disc extrusion or stenosis evident. There are foci of carotid and vertebral artery calcification. IMPRESSION: CT head: Atrophy with extensive periventricular small vessel disease. Age uncertain and possibly recent infarct in the medial left occipital lobe. No acute hemorrhage, mass, or extra-axial fluid collection. Foci of arterial vascular calcification noted. Previous cataract extraction on the left. CT cervical spine: No fracture or spondylolisthesis. Areas of osteoarthritic change, most marked at C5-6 on the right. There are multiple foci of arterial vascular calcifications/atherosclerosis. Electronically Signed   By: Lowella Grip III M.D.   On: 09/14/2015 14:56   I have personally reviewed and evaluated these images and lab results as part of my medical decision-making.   EKG Interpretation   Date/Time:   Wednesday September 14 2015 13:05:15 EDT Ventricular Rate:  55 PR Interval:    QRS Duration: 121 QT Interval:  450 QTC Calculation: 431 R Axis:   12 Text Interpretation:  Sinus rhythm Left bundle branch block since last  tracing no significant change Confirmed by Eulis Foster  MD, Vira Agar [id number]) on  09/14/2015 4:29:49 PM      MDM   Final diagnoses:  Fall, initial encounter  Dementia, without behavioral disturbance  Occipital stroke ([place])   Pt with hx dementia found on floor at facility.  Is reported to be at her baseline.  She does not communicate well at baseline.  Workup significant for newly seen infarct in the left occipital lobe.  Labs otherwise baseline. UA does not appear infected.  Discussed pt with Dr Eulis Foster who also saw the patient.  I called patient's primary care provider Maxwell Caul, NP, who will see patient on Monday.  I have made her aware of the stroke finding. She has no further recommendations for treatment in ED.  She will arrange PT/OT evaluation at her facility.  D/C back to facility.    Clayton Bibles, PA-C 09/14/15 [place], MD 09/15/15 [phone]

## 2015-09-14 NOTE — ED Notes (Signed)
Pt taken to CT.

## 2015-09-14 NOTE — ED Notes (Signed)
Unable to give report to facility. Phone number goes to message, message left,.

## 2015-09-14 NOTE — ED Notes (Signed)
Patient eloped from room around 1750 Last seen. Came back to room few minutes later and she was gone. Looked for patient, [employer], security, and charge RN notified. Patient was found by security. Patient confused but seems well. Gave water. Patient sitting at desk with RN. EDP notified.

## 2015-09-14 NOTE — ED Notes (Signed)
PT found on floor out side of dinning room by staff at [facility] center. Pt is reported to be on Plavix. Pt was seen on 09-12-15 for fall . Pt base line is altered  And speeks little.

## 2015-09-14 NOTE — Discharge Instructions (Signed)
Read the information below.  You may return to the Emergency Department at any time for worsening condition or any new symptoms that concern you.   Fall Prevention in Hospitals, Adult As a hospital patient, your condition and the treatments you receive can increase your risk for falls. Some additional risk factors for falls in a hospital include:  Being in an unfamiliar environment.  Being on bed rest.  Your surgery.  Taking certain medicines.  Your tubing requirements, such as intravenous (IV) therapy or catheters. It is important that you learn how to decrease fall risks while at the hospital. Below are important tips that can help prevent falls. SAFETY TIPS FOR PREVENTING FALLS Talk about your risk of falling.  Ask your health care provider why you are at risk for falling. Is it your medicine, illness, tubing placement, or something else?  Make a plan with your health care provider to keep you safe from falls.  Ask your health care provider or pharmacist about side effects of your medicines. Some medicines can make you dizzy or affect your coordination. Ask for help.  Ask for help before getting out of bed. You may need to press your call button.  Ask for assistance in getting safely to the toilet.  Ask for a walker or cane to be put at your bedside. Ask that most of the side rails on your bed be placed up before your health care provider leaves the room.  Ask family or friends to sit with you.  Ask for things that are out of your reach, such as your glasses, hearing aids, telephone, bedside table, or call button. Follow these tips to avoid falling:  Stay lying or seated, rather than standing, while waiting for help.  Wear rubber-soled slippers or shoes whenever you walk in the hospital.  Avoid quick, sudden movements.  Change positions slowly.  Sit on the side of your bed before standing.  Stand up slowly and wait before you start to walk.  Let your health care  provider know if there is a spill on the floor.  Pay careful attention to the medical equipment, electrical cords, and tubes around you.  When you need help, use your call button by your bed or in the bathroom. Wait for one of your health care providers to help you.  If you feel dizzy or unsure of your footing, return to bed and wait for assistance.  Avoid being distracted by the TV, telephone, or another person in your room.  Do not lean or support yourself on rolling objects, such as IV poles or bedside tables.   This information is not intended to replace advice given to you by your health care provider. Make sure you discuss any questions you have with your health care provider.   Document Released: 02/10/2000 Document Revised: 03/05/2014 Document Reviewed: 10/21/2011 Elsevier Interactive Patient Education [employer].

## 2015-09-14 NOTE — ED Notes (Signed)
PTAR here to get patient  ?

## 2015-09-14 NOTE — ED Provider Notes (Signed)
  Face-to-face evaluation   History: Patient with dementia, was found on floor by caregivers, and therefore was sent to the ED.  Physical exam: Patient is alert, calm, and confused. No overt visible sign of trauma. She is moving arms and legs easily.   Medical screening examination/treatment/procedure(s) were conducted as a shared visit with non-physician practitioner(s) and myself.  I personally evaluated the patient during the encounter   Daleen Bo, MD 09/15/15 [phone]

## 2015-09-19 DIAGNOSIS — R69 Illness, unspecified: Secondary | ICD-10-CM | POA: Diagnosis not present

## 2015-09-19 DIAGNOSIS — W19XXXA Unspecified fall, initial encounter: Secondary | ICD-10-CM | POA: Diagnosis not present

## 2015-09-19 DIAGNOSIS — G301 Alzheimer's disease with late onset: Secondary | ICD-10-CM | POA: Diagnosis not present

## 2015-09-23 ENCOUNTER — Telehealth: Payer: Self-pay | Admitting: Family Medicine

## 2015-09-23 NOTE — Telephone Encounter (Signed)
Santiago Glad said pt has transitioned to [facility] in [place] and probably would not be back because they have medical staff there.

## 2015-09-23 NOTE — Telephone Encounter (Signed)
This is note my patient. But it will be noted. Thanks! AK

## 2015-09-25 ENCOUNTER — Encounter (HOSPITAL_COMMUNITY): Payer: Self-pay | Admitting: *Deleted

## 2015-09-25 ENCOUNTER — Emergency Department (HOSPITAL_COMMUNITY)
Admission: EM | Admit: 2015-09-25 | Discharge: 2015-09-25 | Disposition: A | Discharge status: Home / Self Care | Payer: Medicare HMO | Attending: Emergency Medicine | Admitting: Emergency Medicine

## 2015-09-25 DIAGNOSIS — I110 Hypertensive heart disease with heart failure: Secondary | ICD-10-CM | POA: Diagnosis not present

## 2015-09-25 DIAGNOSIS — W19XXXA Unspecified fall, initial encounter: Secondary | ICD-10-CM | POA: Insufficient documentation

## 2015-09-25 DIAGNOSIS — F0390 Unspecified dementia without behavioral disturbance: Secondary | ICD-10-CM | POA: Insufficient documentation

## 2015-09-25 DIAGNOSIS — Z7982 Long term (current) use of aspirin: Secondary | ICD-10-CM | POA: Diagnosis not present

## 2015-09-25 DIAGNOSIS — R259 Unspecified abnormal involuntary movements: Secondary | ICD-10-CM | POA: Diagnosis not present

## 2015-09-25 DIAGNOSIS — I252 Old myocardial infarction: Secondary | ICD-10-CM | POA: Diagnosis not present

## 2015-09-25 DIAGNOSIS — T148 Other injury of unspecified body region: Secondary | ICD-10-CM | POA: Diagnosis not present

## 2015-09-25 DIAGNOSIS — Z87891 Personal history of nicotine dependence: Secondary | ICD-10-CM | POA: Insufficient documentation

## 2015-09-25 DIAGNOSIS — Z79899 Other long term (current) drug therapy: Secondary | ICD-10-CM | POA: Insufficient documentation

## 2015-09-25 DIAGNOSIS — T1490 Injury, unspecified: Secondary | ICD-10-CM | POA: Diagnosis not present

## 2015-09-25 DIAGNOSIS — I2510 Atherosclerotic heart disease of native coronary artery without angina pectoris: Secondary | ICD-10-CM | POA: Diagnosis not present

## 2015-09-25 DIAGNOSIS — Y92129 Unspecified place in nursing home as the place of occurrence of the external cause: Secondary | ICD-10-CM | POA: Diagnosis not present

## 2015-09-25 DIAGNOSIS — Y939 Activity, unspecified: Secondary | ICD-10-CM | POA: Insufficient documentation

## 2015-09-25 DIAGNOSIS — I5022 Chronic systolic (congestive) heart failure: Secondary | ICD-10-CM | POA: Insufficient documentation

## 2015-09-25 DIAGNOSIS — R2689 Other abnormalities of gait and mobility: Secondary | ICD-10-CM | POA: Diagnosis not present

## 2015-09-25 DIAGNOSIS — Y999 Unspecified external cause status: Secondary | ICD-10-CM | POA: Diagnosis not present

## 2015-09-25 MED ORDER — SODIUM CHLORIDE 0.9 % IV BOLUS (SEPSIS)
1000.0000 mL | Freq: Once | INTRAVENOUS | Status: AC
  Administered 2015-09-25: 1000 mL via INTRAVENOUS

## 2015-09-25 NOTE — ED Triage Notes (Signed)
Pt from [facility] via EMS post fall. EMS reports that staff reports they found pt on floor. Pt has hx of lying on the floor per staff. Pt denies injury. Pt has hx of dementia. EDP at bedside. Pt in NAD

## 2015-09-25 NOTE — ED Notes (Signed)
Bed: WHALE Expected date:  Expected time:  Means of arrival:  Comments: 

## 2015-09-25 NOTE — ED Notes (Signed)
Three attempts made to give report. No answer at facility

## 2015-09-25 NOTE — ED Notes (Signed)
Bed: WA02 Expected date:  Expected time:  Means of arrival:  Comments: 70 yo fall

## 2015-09-25 NOTE — Discharge Instructions (Signed)
As discussed, your evaluation today has been largely reassuring.  But, it is important that you monitor your condition carefully, and do not hesitate to return to the ED if you develop new, or concerning changes in your condition. ? ?Otherwise, please follow-up with your physician for appropriate ongoing care. ? ?

## 2015-09-25 NOTE — ED Notes (Signed)
PTAR called for transportation. [facility] called to give report, no answer at this time. Will attempt to call again.

## 2015-09-25 NOTE — ED Provider Notes (Signed)
[facility] DEPT Provider Note   CSN: [id number] Arrival date & time: 09/25/15  1551  First Provider Contact:  First MD Initiated Contact with Patient 09/25/15 1604        History   Chief Complaint Chief Complaint  Patient presents with  . Fall    HPI [place] is a 70 y.o. female.  HPI Elderly female with dementia presents due to a possible fall. The patient herself denies any complaints, including weakness or pain. Per reports patient had a fall at her nursing facility. EMS reports the patient had questionable hypotension en route, but was otherwise awake and alert, interactive. Level V caveat secondary to dementia. Past Medical History:  Diagnosis Date  . CAD (coronary artery disease)    a. s/p IL STEMI 7/11 => tx with BMS to RCA (tx at [facility]);  b.  AL STEMI 7/12 => LHC: mRCA 100% ==> PCI with BMS to RCA; LM 10%, pCFX 50%, OM1 10%, pLAD 50%, mLAD 99%, 70%  ==>  c. staged PCI: BMS to  LAD (tx at [facility])  . Cataracts, bilateral   . CHF (congestive heart failure) ([place])   . Chronic systolic heart failure ([place])   . Dementia   . GERD (gastroesophageal reflux disease)   . HTN (hypertension)   . Hyperlipidemia   . Ischemic cardiomyopathy    a. EF 30-35% at time of AL STEMI 7/12;   b. IMPROVED by f/u Echo 9/13:  mild LVH, EF 45-50%, mid to dist Inf, AS, Apical HK, Gr 1 diast dysfn, MAC, mod LAE  . Poor short-term memory   . STEMI (ST elevation myocardial infarction) ([place]) 2011; 2012   [place]; [place]    Patient Active Problem List   Diagnosis Date Noted  . Dementia with behavioral disturbance 06/21/2015  . Dementia 03/29/2015  . Body mass index 40.0-44.9, adult ([place]) 12/17/2014  . Morbid obesity ([place]) 12/17/2014  . Depression 09/17/2014  . Calculus of bile duct without mention of cholecystitis or obstruction 10/08/2012  . Abnormal cholangiogram 10/08/2012  . Mucosal abnormality of duodenum 10/08/2012  . Abnormal LFTs (liver function tests) 09/12/2012  . Abdominal pain,  epigastric 09/12/2012  . Non-STEMI (non-ST elevated myocardial infarction) ([place]) 09/11/2012  . CAD (coronary artery disease)   . Myocardial infarction ([place])   . Coronary artery disease   . CHF (congestive heart failure) ([place])   . HTN (hypertension)   . Hyperlipidemia   . Ischemic cardiomyopathy     Past Surgical History:  Procedure Laterality Date  . CATARACT EXTRACTION W/ INTRAOCULAR LENS IMPLANT Left ~ 2008  . CHOLECYSTECTOMY N/A 10/07/2012   Procedure: LAPAROSCOPIC CHOLECYSTECTOMY WITH INTRAOPERATIVE CHOLANGIOGRAM;  Surgeon: Shann Medal, MD;  Location: [facility]S;  Service: General;  Laterality: N/A;  . CORONARY ANGIOPLASTY WITH STENT PLACEMENT  2011; 2012   RCA; RCA & LAD  . DILATION AND CURETTAGE OF UTERUS  1970's  . ERCP N/A 10/08/2012   Procedure: ENDOSCOPIC RETROGRADE CHOLANGIOPANCREATOGRAPHY (ERCP);  Surgeon: Gatha Mayer, MD;  Location: [facility];  Service: Endoscopy;  Laterality: N/A;  . ERCP W/ SPHICTEROTOMY  11/10/2012   stones removed [place]  . TONSILLECTOMY AND ADENOIDECTOMY  1980's    OB History    No data available       Home Medications    Prior to Admission medications   Medication Sig Start Date End Date Taking? Authorizing Provider  acetaminophen (TYLENOL) 500 MG tablet Take 500 mg by mouth every 4 (four) hours as needed for moderate pain or  headache.    Historical Provider, MD  alum & mag hydroxide-simeth ([place]) 200-200-20 MG/5ML suspension Take 30 mLs by mouth every 6 (six) hours as needed for indigestion or heartburn.    Historical Provider, MD  aspirin EC 81 MG tablet Take 1 tablet (81 mg total) by mouth as needed. Patient taking differently: Take 81 mg by mouth daily.  06/27/15   Carrie Mew, MD  atorvastatin (LIPITOR) 80 MG tablet Take 1 tablet (80 mg total) by mouth at bedtime. 06/27/15   Carrie Mew, MD  clonazePAM (KLONOPIN) 0.5 MG tablet Take 0.5 mg by mouth daily.    Historical Provider, MD  clopidogrel (PLAVIX) 75 MG tablet Take 1  tablet (75 mg total) by mouth daily. 06/27/15   Carrie Mew, MD  FLUoxetine (PROZAC) 10 MG capsule Take 30 mg by mouth daily.     Historical Provider, MD  furosemide (LASIX) 40 MG tablet Take 1 tablet (40 mg total) by mouth every morning. 06/27/15   Carrie Mew, MD  guaifenesin (ROBITUSSIN) 100 MG/5ML syrup Take 200 mg by mouth every 6 (six) hours as needed for cough.     Historical Provider, MD  lisinopril (ZESTRIL) 2.5 MG tablet Take 1 tablet (2.5 mg total) by mouth daily. 06/27/15   Carrie Mew, MD  loperamide (IMODIUM) 2 MG capsule Take 2 mg by mouth daily as needed for diarrhea or loose stools.    Historical Provider, MD  magnesium hydroxide (MILK OF MAGNESIA) 400 MG/5ML suspension Take 30 mLs by mouth at bedtime as needed for moderate constipation.     Historical Provider, MD  Neomycin-Bacitracin-Polymyxin (TRIPLE ANTIBIOTIC) [phone] OINT Apply 1 application topically as needed (wound care).    Historical Provider, MD  nitroGLYCERIN (NITROSTAT) 0.4 MG SL tablet Place 1 tablet (0.4 mg total) under the tongue every 5 (five) minutes as needed for chest pain. 06/27/15   Carrie Mew, MD  risperiDONE (RISPERDAL) 0.25 MG tablet Take 1 tablet (0.25 mg total) by mouth every 6 (six) hours as needed (agitation). 06/27/15   Carrie Mew, MD  risperiDONE (RISPERDAL) 0.5 MG tablet Take 1 tablet (0.5 mg total) by mouth 2 (two) times daily. 06/27/15   Carrie Mew, MD    Family History Family History  Problem Relation Age of Onset  . Heart attack Father     DECEASED  . Stroke Father     DECEASED  . Leukemia Mother     DECEASED  . Heart attack Sister     ALIVE  . Hypertension Sister     Social History Social History  Substance Use Topics  . Smoking status: Former Smoker    Packs/day: 2.00    Years: 30.00    Types: Cigarettes    Quit date: 10/25/2002  . Smokeless tobacco: Never Used  . Alcohol use No     Allergies   Review of patient's allergies indicates no known  allergies.   Review of Systems Review of Systems  Unable to perform ROS: Dementia     Physical Exam Updated Vital Signs SpO2 96% Comment: RA  Physical Exam  Constitutional: She appears well-developed and well-nourished. No distress.  HENT:  Head: Normocephalic and atraumatic.  Eyes: Conjunctivae and EOM are normal.  Neck: No spinous process tenderness and no muscular tenderness present. No neck rigidity. No edema, no erythema and normal range of motion present.  Cardiovascular: Normal rate and regular rhythm.   Pulmonary/Chest: Effort normal and breath sounds normal. No stridor. No respiratory distress.  Abdominal: She exhibits no distension.  Musculoskeletal: She exhibits no edema.  Neurological: She is alert. She displays no atrophy and no tremor. No cranial nerve deficit or sensory deficit. She exhibits normal muscle tone. She displays no seizure activity. Coordination normal.  Skin: Skin is warm and dry.  Psychiatric: She has a normal mood and affect. Cognition and memory are impaired.  Nursing note and vitals reviewed.    ED Treatments / Results  Labs (all labs ordered are listed, but only abnormal results are displayed) Labs Reviewed - No data to display  EKG  EKG Interpretation None       Radiology No results found.  Procedures Procedures (including critical care time)  Medications Ordered in ED Medications - No data to display   Initial Impression / Assessment and Plan / ED Course  I have reviewed the triage vital signs and the nursing notes.  Pertinent labs & imaging results that were available during my care of the patient were reviewed by me and considered in my medical decision making (see chart for details).  Clinical Course    Blood pressure no appropriate, no new complaints, no evidence for distress.  Final Clinical Impressions(s) / ED Diagnoses   Elderly female dementia presents from nursing facility after a fall. Here she is awake and  alert, no evidence for neurologic dysfunction. Patient has no neck pain, and absent neurologic dysfunction, head CT, neck CT indicated. Patient monitoring here, with no decompensation, no notable changes, discharged in stable condition to her nursing facility. New Prescriptions New Prescriptions   No medications on file     Carmin Muskrat, MD 09/25/15 1909

## 2015-09-26 DIAGNOSIS — G300 Alzheimer's disease with early onset: Secondary | ICD-10-CM | POA: Diagnosis not present

## 2015-09-26 DIAGNOSIS — W19XXXA Unspecified fall, initial encounter: Secondary | ICD-10-CM | POA: Diagnosis not present

## 2015-09-26 DIAGNOSIS — R69 Illness, unspecified: Secondary | ICD-10-CM | POA: Diagnosis not present

## 2015-09-26 NOTE — Telephone Encounter (Signed)
She was my patient,  Noted.-jh

## 2015-09-27 ENCOUNTER — Ambulatory Visit: Payer: Commercial Managed Care - HMO | Admitting: Family Medicine

## 2015-10-27 DIAGNOSIS — R69 Illness, unspecified: Secondary | ICD-10-CM | POA: Diagnosis not present

## 2015-10-27 DIAGNOSIS — W19XXXA Unspecified fall, initial encounter: Secondary | ICD-10-CM | POA: Diagnosis not present

## 2015-10-27 DIAGNOSIS — G300 Alzheimer's disease with early onset: Secondary | ICD-10-CM | POA: Diagnosis not present

## 2015-10-28 DIAGNOSIS — E559 Vitamin D deficiency, unspecified: Secondary | ICD-10-CM | POA: Diagnosis not present

## 2015-10-28 DIAGNOSIS — Z79899 Other long term (current) drug therapy: Secondary | ICD-10-CM | POA: Diagnosis not present

## 2015-10-28 DIAGNOSIS — R319 Hematuria, unspecified: Secondary | ICD-10-CM | POA: Diagnosis not present

## 2015-10-28 DIAGNOSIS — I10 Essential (primary) hypertension: Secondary | ICD-10-CM | POA: Diagnosis not present

## 2015-10-28 DIAGNOSIS — D649 Anemia, unspecified: Secondary | ICD-10-CM | POA: Diagnosis not present

## 2015-10-28 DIAGNOSIS — N390 Urinary tract infection, site not specified: Secondary | ICD-10-CM | POA: Diagnosis not present

## 2015-11-14 DIAGNOSIS — R69 Illness, unspecified: Secondary | ICD-10-CM | POA: Diagnosis not present

## 2015-11-14 DIAGNOSIS — I10 Essential (primary) hypertension: Secondary | ICD-10-CM | POA: Diagnosis not present

## 2015-11-14 DIAGNOSIS — G300 Alzheimer's disease with early onset: Secondary | ICD-10-CM | POA: Diagnosis not present

## 2015-11-21 DIAGNOSIS — R296 Repeated falls: Secondary | ICD-10-CM | POA: Diagnosis not present

## 2015-11-21 DIAGNOSIS — R2681 Unsteadiness on feet: Secondary | ICD-10-CM | POA: Diagnosis not present

## 2015-11-21 DIAGNOSIS — G300 Alzheimer's disease with early onset: Secondary | ICD-10-CM | POA: Diagnosis not present

## 2015-11-21 DIAGNOSIS — R69 Illness, unspecified: Secondary | ICD-10-CM | POA: Diagnosis not present

## 2015-11-23 DIAGNOSIS — R69 Illness, unspecified: Secondary | ICD-10-CM | POA: Diagnosis not present

## 2015-11-28 DIAGNOSIS — R2681 Unsteadiness on feet: Secondary | ICD-10-CM | POA: Diagnosis not present

## 2015-11-28 DIAGNOSIS — G300 Alzheimer's disease with early onset: Secondary | ICD-10-CM | POA: Diagnosis not present

## 2015-11-28 DIAGNOSIS — R69 Illness, unspecified: Secondary | ICD-10-CM | POA: Diagnosis not present

## 2015-12-05 DIAGNOSIS — Z5181 Encounter for therapeutic drug level monitoring: Secondary | ICD-10-CM | POA: Diagnosis not present

## 2015-12-25 DIAGNOSIS — R69 Illness, unspecified: Secondary | ICD-10-CM | POA: Diagnosis not present

## 2016-01-16 DIAGNOSIS — I10 Essential (primary) hypertension: Secondary | ICD-10-CM | POA: Diagnosis not present

## 2016-01-16 DIAGNOSIS — R69 Illness, unspecified: Secondary | ICD-10-CM | POA: Diagnosis not present

## 2016-01-16 DIAGNOSIS — I5022 Chronic systolic (congestive) heart failure: Secondary | ICD-10-CM | POA: Diagnosis not present

## 2016-01-16 DIAGNOSIS — G300 Alzheimer's disease with early onset: Secondary | ICD-10-CM | POA: Diagnosis not present

## 2016-01-18 DIAGNOSIS — H2511 Age-related nuclear cataract, right eye: Secondary | ICD-10-CM | POA: Diagnosis not present

## 2016-01-27 DIAGNOSIS — R69 Illness, unspecified: Secondary | ICD-10-CM | POA: Diagnosis not present

## 2016-01-28 DIAGNOSIS — R69 Illness, unspecified: Secondary | ICD-10-CM | POA: Diagnosis not present

## 2016-01-29 DIAGNOSIS — R69 Illness, unspecified: Secondary | ICD-10-CM | POA: Diagnosis not present

## 2016-01-30 DIAGNOSIS — R69 Illness, unspecified: Secondary | ICD-10-CM | POA: Diagnosis not present

## 2016-01-31 DIAGNOSIS — F321 Major depressive disorder, single episode, moderate: Secondary | ICD-10-CM | POA: Diagnosis not present

## 2016-01-31 DIAGNOSIS — F0281 Dementia in other diseases classified elsewhere with behavioral disturbance: Secondary | ICD-10-CM | POA: Diagnosis not present

## 2016-01-31 DIAGNOSIS — G309 Alzheimer's disease, unspecified: Secondary | ICD-10-CM | POA: Diagnosis not present

## 2016-01-31 DIAGNOSIS — R69 Illness, unspecified: Secondary | ICD-10-CM | POA: Diagnosis not present

## 2016-02-01 DIAGNOSIS — R69 Illness, unspecified: Secondary | ICD-10-CM | POA: Diagnosis not present

## 2016-02-02 DIAGNOSIS — R69 Illness, unspecified: Secondary | ICD-10-CM | POA: Diagnosis not present

## 2016-02-03 DIAGNOSIS — R69 Illness, unspecified: Secondary | ICD-10-CM | POA: Diagnosis not present

## 2016-02-11 DIAGNOSIS — R69 Illness, unspecified: Secondary | ICD-10-CM | POA: Diagnosis not present

## 2016-02-12 DIAGNOSIS — R69 Illness, unspecified: Secondary | ICD-10-CM | POA: Diagnosis not present

## 2016-02-13 DIAGNOSIS — G300 Alzheimer's disease with early onset: Secondary | ICD-10-CM | POA: Diagnosis not present

## 2016-02-13 DIAGNOSIS — R69 Illness, unspecified: Secondary | ICD-10-CM | POA: Diagnosis not present

## 2016-02-13 DIAGNOSIS — I10 Essential (primary) hypertension: Secondary | ICD-10-CM | POA: Diagnosis not present

## 2016-02-13 DIAGNOSIS — I5022 Chronic systolic (congestive) heart failure: Secondary | ICD-10-CM | POA: Diagnosis not present

## 2016-02-13 DIAGNOSIS — E782 Mixed hyperlipidemia: Secondary | ICD-10-CM | POA: Diagnosis not present

## 2016-02-14 DIAGNOSIS — R69 Illness, unspecified: Secondary | ICD-10-CM | POA: Diagnosis not present

## 2016-02-15 DIAGNOSIS — R69 Illness, unspecified: Secondary | ICD-10-CM | POA: Diagnosis not present

## 2016-02-16 DIAGNOSIS — R69 Illness, unspecified: Secondary | ICD-10-CM | POA: Diagnosis not present

## 2016-02-17 DIAGNOSIS — R69 Illness, unspecified: Secondary | ICD-10-CM | POA: Diagnosis not present

## 2016-02-18 DIAGNOSIS — R69 Illness, unspecified: Secondary | ICD-10-CM | POA: Diagnosis not present

## 2016-02-20 DIAGNOSIS — R69 Illness, unspecified: Secondary | ICD-10-CM | POA: Diagnosis not present

## 2016-02-21 DIAGNOSIS — R69 Illness, unspecified: Secondary | ICD-10-CM | POA: Diagnosis not present

## 2016-02-22 DIAGNOSIS — R69 Illness, unspecified: Secondary | ICD-10-CM | POA: Diagnosis not present

## 2016-02-23 DIAGNOSIS — R69 Illness, unspecified: Secondary | ICD-10-CM | POA: Diagnosis not present

## 2016-02-24 DIAGNOSIS — R69 Illness, unspecified: Secondary | ICD-10-CM | POA: Diagnosis not present

## 2016-02-25 DIAGNOSIS — R69 Illness, unspecified: Secondary | ICD-10-CM | POA: Diagnosis not present

## 2016-02-26 DIAGNOSIS — R69 Illness, unspecified: Secondary | ICD-10-CM | POA: Diagnosis not present

## 2016-02-27 DIAGNOSIS — R69 Illness, unspecified: Secondary | ICD-10-CM | POA: Diagnosis not present

## 2016-02-28 DIAGNOSIS — R69 Illness, unspecified: Secondary | ICD-10-CM | POA: Diagnosis not present

## 2016-02-29 DIAGNOSIS — R69 Illness, unspecified: Secondary | ICD-10-CM | POA: Diagnosis not present

## 2016-03-01 DIAGNOSIS — R69 Illness, unspecified: Secondary | ICD-10-CM | POA: Diagnosis not present

## 2016-03-02 DIAGNOSIS — R69 Illness, unspecified: Secondary | ICD-10-CM | POA: Diagnosis not present

## 2016-03-03 DIAGNOSIS — R69 Illness, unspecified: Secondary | ICD-10-CM | POA: Diagnosis not present

## 2016-03-04 DIAGNOSIS — R69 Illness, unspecified: Secondary | ICD-10-CM | POA: Diagnosis not present

## 2016-03-07 DIAGNOSIS — Z79899 Other long term (current) drug therapy: Secondary | ICD-10-CM | POA: Diagnosis not present

## 2016-03-12 DIAGNOSIS — R69 Illness, unspecified: Secondary | ICD-10-CM | POA: Diagnosis not present

## 2016-03-13 DIAGNOSIS — R69 Illness, unspecified: Secondary | ICD-10-CM | POA: Diagnosis not present

## 2016-03-14 DIAGNOSIS — R69 Illness, unspecified: Secondary | ICD-10-CM | POA: Diagnosis not present

## 2016-03-15 DIAGNOSIS — R69 Illness, unspecified: Secondary | ICD-10-CM | POA: Diagnosis not present

## 2016-03-16 DIAGNOSIS — R69 Illness, unspecified: Secondary | ICD-10-CM | POA: Diagnosis not present

## 2016-03-24 DIAGNOSIS — R69 Illness, unspecified: Secondary | ICD-10-CM | POA: Diagnosis not present

## 2016-03-25 DIAGNOSIS — R69 Illness, unspecified: Secondary | ICD-10-CM | POA: Diagnosis not present

## 2016-03-26 DIAGNOSIS — R69 Illness, unspecified: Secondary | ICD-10-CM | POA: Diagnosis not present

## 2016-03-27 DIAGNOSIS — R69 Illness, unspecified: Secondary | ICD-10-CM | POA: Diagnosis not present

## 2016-03-28 DIAGNOSIS — R69 Illness, unspecified: Secondary | ICD-10-CM | POA: Diagnosis not present

## 2016-03-29 DIAGNOSIS — R69 Illness, unspecified: Secondary | ICD-10-CM | POA: Diagnosis not present

## 2016-03-30 DIAGNOSIS — R69 Illness, unspecified: Secondary | ICD-10-CM | POA: Diagnosis not present

## 2016-03-31 DIAGNOSIS — R69 Illness, unspecified: Secondary | ICD-10-CM | POA: Diagnosis not present

## 2016-04-01 DIAGNOSIS — R69 Illness, unspecified: Secondary | ICD-10-CM | POA: Diagnosis not present

## 2016-04-02 DIAGNOSIS — R69 Illness, unspecified: Secondary | ICD-10-CM | POA: Diagnosis not present

## 2016-04-03 DIAGNOSIS — F0281 Dementia in other diseases classified elsewhere with behavioral disturbance: Secondary | ICD-10-CM | POA: Diagnosis not present

## 2016-04-03 DIAGNOSIS — G309 Alzheimer's disease, unspecified: Secondary | ICD-10-CM | POA: Diagnosis not present

## 2016-04-03 DIAGNOSIS — R69 Illness, unspecified: Secondary | ICD-10-CM | POA: Diagnosis not present

## 2016-04-03 DIAGNOSIS — F321 Major depressive disorder, single episode, moderate: Secondary | ICD-10-CM | POA: Diagnosis not present

## 2016-04-04 DIAGNOSIS — R69 Illness, unspecified: Secondary | ICD-10-CM | POA: Diagnosis not present

## 2016-04-05 DIAGNOSIS — R69 Illness, unspecified: Secondary | ICD-10-CM | POA: Diagnosis not present

## 2016-04-06 DIAGNOSIS — R69 Illness, unspecified: Secondary | ICD-10-CM | POA: Diagnosis not present

## 2016-04-07 DIAGNOSIS — R69 Illness, unspecified: Secondary | ICD-10-CM | POA: Diagnosis not present

## 2016-04-08 DIAGNOSIS — R69 Illness, unspecified: Secondary | ICD-10-CM | POA: Diagnosis not present

## 2016-04-09 DIAGNOSIS — R69 Illness, unspecified: Secondary | ICD-10-CM | POA: Diagnosis not present

## 2016-04-10 DIAGNOSIS — R69 Illness, unspecified: Secondary | ICD-10-CM | POA: Diagnosis not present

## 2016-04-11 DIAGNOSIS — R69 Illness, unspecified: Secondary | ICD-10-CM | POA: Diagnosis not present

## 2016-04-12 DIAGNOSIS — R69 Illness, unspecified: Secondary | ICD-10-CM | POA: Diagnosis not present

## 2016-04-13 DIAGNOSIS — R69 Illness, unspecified: Secondary | ICD-10-CM | POA: Diagnosis not present

## 2016-04-16 DIAGNOSIS — R69 Illness, unspecified: Secondary | ICD-10-CM | POA: Diagnosis not present

## 2016-04-17 DIAGNOSIS — R69 Illness, unspecified: Secondary | ICD-10-CM | POA: Diagnosis not present

## 2016-04-18 DIAGNOSIS — R69 Illness, unspecified: Secondary | ICD-10-CM | POA: Diagnosis not present

## 2016-04-19 DIAGNOSIS — R69 Illness, unspecified: Secondary | ICD-10-CM | POA: Diagnosis not present

## 2016-04-20 DIAGNOSIS — R69 Illness, unspecified: Secondary | ICD-10-CM | POA: Diagnosis not present

## 2016-04-23 DIAGNOSIS — R32 Unspecified urinary incontinence: Secondary | ICD-10-CM | POA: Diagnosis not present

## 2016-04-30 DIAGNOSIS — R69 Illness, unspecified: Secondary | ICD-10-CM | POA: Diagnosis not present

## 2016-05-01 DIAGNOSIS — R69 Illness, unspecified: Secondary | ICD-10-CM | POA: Diagnosis not present

## 2016-05-02 DIAGNOSIS — R69 Illness, unspecified: Secondary | ICD-10-CM | POA: Diagnosis not present

## 2016-05-03 DIAGNOSIS — R69 Illness, unspecified: Secondary | ICD-10-CM | POA: Diagnosis not present

## 2016-05-04 DIAGNOSIS — R69 Illness, unspecified: Secondary | ICD-10-CM | POA: Diagnosis not present

## 2016-05-07 DIAGNOSIS — R69 Illness, unspecified: Secondary | ICD-10-CM | POA: Diagnosis not present

## 2016-05-08 DIAGNOSIS — R69 Illness, unspecified: Secondary | ICD-10-CM | POA: Diagnosis not present

## 2016-05-09 DIAGNOSIS — R69 Illness, unspecified: Secondary | ICD-10-CM | POA: Diagnosis not present

## 2016-05-10 DIAGNOSIS — R69 Illness, unspecified: Secondary | ICD-10-CM | POA: Diagnosis not present

## 2016-05-11 DIAGNOSIS — R69 Illness, unspecified: Secondary | ICD-10-CM | POA: Diagnosis not present

## 2016-05-12 DIAGNOSIS — R69 Illness, unspecified: Secondary | ICD-10-CM | POA: Diagnosis not present

## 2016-05-13 DIAGNOSIS — R69 Illness, unspecified: Secondary | ICD-10-CM | POA: Diagnosis not present

## 2016-05-14 DIAGNOSIS — R69 Illness, unspecified: Secondary | ICD-10-CM | POA: Diagnosis not present

## 2016-05-15 DIAGNOSIS — R69 Illness, unspecified: Secondary | ICD-10-CM | POA: Diagnosis not present

## 2016-05-16 DIAGNOSIS — R69 Illness, unspecified: Secondary | ICD-10-CM | POA: Diagnosis not present

## 2016-05-17 DIAGNOSIS — R69 Illness, unspecified: Secondary | ICD-10-CM | POA: Diagnosis not present

## 2016-05-18 DIAGNOSIS — R69 Illness, unspecified: Secondary | ICD-10-CM | POA: Diagnosis not present

## 2016-05-19 DIAGNOSIS — R69 Illness, unspecified: Secondary | ICD-10-CM | POA: Diagnosis not present

## 2016-05-20 DIAGNOSIS — R69 Illness, unspecified: Secondary | ICD-10-CM | POA: Diagnosis not present

## 2016-05-22 DIAGNOSIS — R32 Unspecified urinary incontinence: Secondary | ICD-10-CM | POA: Diagnosis not present

## 2016-05-26 DIAGNOSIS — R69 Illness, unspecified: Secondary | ICD-10-CM | POA: Diagnosis not present

## 2016-05-29 DIAGNOSIS — F0281 Dementia in other diseases classified elsewhere with behavioral disturbance: Secondary | ICD-10-CM | POA: Diagnosis not present

## 2016-05-29 DIAGNOSIS — R69 Illness, unspecified: Secondary | ICD-10-CM | POA: Diagnosis not present

## 2016-05-29 DIAGNOSIS — F321 Major depressive disorder, single episode, moderate: Secondary | ICD-10-CM | POA: Diagnosis not present

## 2016-05-29 DIAGNOSIS — G309 Alzheimer's disease, unspecified: Secondary | ICD-10-CM | POA: Diagnosis not present

## 2016-06-02 ENCOUNTER — Encounter (HOSPITAL_COMMUNITY): Payer: Self-pay | Admitting: Emergency Medicine

## 2016-06-02 ENCOUNTER — Emergency Department (HOSPITAL_COMMUNITY)
Admission: EM | Admit: 2016-06-02 | Discharge: 2016-06-02 | Disposition: A | Discharge status: Home / Self Care | Payer: Medicare HMO | Attending: Emergency Medicine | Admitting: Emergency Medicine

## 2016-06-02 ENCOUNTER — Emergency Department (HOSPITAL_COMMUNITY): Payer: Medicare HMO

## 2016-06-02 DIAGNOSIS — G8911 Acute pain due to trauma: Secondary | ICD-10-CM | POA: Diagnosis not present

## 2016-06-02 DIAGNOSIS — Z955 Presence of coronary angioplasty implant and graft: Secondary | ICD-10-CM | POA: Insufficient documentation

## 2016-06-02 DIAGNOSIS — Z87891 Personal history of nicotine dependence: Secondary | ICD-10-CM | POA: Insufficient documentation

## 2016-06-02 DIAGNOSIS — Z7982 Long term (current) use of aspirin: Secondary | ICD-10-CM | POA: Insufficient documentation

## 2016-06-02 DIAGNOSIS — W19XXXA Unspecified fall, initial encounter: Secondary | ICD-10-CM | POA: Diagnosis not present

## 2016-06-02 DIAGNOSIS — S8990XA Unspecified injury of unspecified lower leg, initial encounter: Secondary | ICD-10-CM | POA: Diagnosis not present

## 2016-06-02 DIAGNOSIS — I2510 Atherosclerotic heart disease of native coronary artery without angina pectoris: Secondary | ICD-10-CM | POA: Diagnosis not present

## 2016-06-02 DIAGNOSIS — Y999 Unspecified external cause status: Secondary | ICD-10-CM | POA: Insufficient documentation

## 2016-06-02 DIAGNOSIS — I252 Old myocardial infarction: Secondary | ICD-10-CM | POA: Diagnosis not present

## 2016-06-02 DIAGNOSIS — Y939 Activity, unspecified: Secondary | ICD-10-CM | POA: Diagnosis not present

## 2016-06-02 DIAGNOSIS — I110 Hypertensive heart disease with heart failure: Secondary | ICD-10-CM | POA: Insufficient documentation

## 2016-06-02 DIAGNOSIS — Y92129 Unspecified place in nursing home as the place of occurrence of the external cause: Secondary | ICD-10-CM | POA: Diagnosis not present

## 2016-06-02 DIAGNOSIS — S8991XA Unspecified injury of right lower leg, initial encounter: Secondary | ICD-10-CM | POA: Diagnosis present

## 2016-06-02 DIAGNOSIS — S8000XA Contusion of unspecified knee, initial encounter: Secondary | ICD-10-CM

## 2016-06-02 DIAGNOSIS — I5022 Chronic systolic (congestive) heart failure: Secondary | ICD-10-CM | POA: Insufficient documentation

## 2016-06-02 DIAGNOSIS — S8001XA Contusion of right knee, initial encounter: Secondary | ICD-10-CM | POA: Insufficient documentation

## 2016-06-02 DIAGNOSIS — M25561 Pain in right knee: Secondary | ICD-10-CM | POA: Diagnosis not present

## 2016-06-02 NOTE — ED Triage Notes (Addendum)
From [facility] via EMS,  unwitnessed and unknown fall.  Pt and staff report R knee pain.  No LOC or head injury as far as we know.  Pt can ambulate w/assistance but limps on R leg.  Tenderness & discoloration R kneecap per EMS.  Hx dementia, at baseline per staff. VS:  108/80, 53 bpm, 18 RR, 98% RA

## 2016-06-02 NOTE — ED Provider Notes (Addendum)
[facility] DEPT Provider Note   CSN: [id number] Arrival date & time: 06/02/16  0415     History   Chief Complaint Chief Complaint  Patient presents with  . Flank Pain  . Fall    HPI Kaitlyn Simmons is a 71 y.o. female.  71 year old female with history of dementia found on the ground at the nursing home with possible fall. Patient herself complains of no pain. Abrasion was noted to the patient's right knee. She had no visible signs of head trauma. There is no vomiting noted. She is at her baseline. She was transported here for further management.      Past Medical History:  Diagnosis Date  . CAD (coronary artery disease)    a. s/p IL STEMI 7/11 => tx with BMS to RCA (tx at [facility]);  b.  AL STEMI 7/12 => LHC: mRCA 100% ==> PCI with BMS to RCA; LM 10%, pCFX 50%, OM1 10%, pLAD 50%, mLAD 99%, 70%  ==>  c. staged PCI: BMS to  LAD (tx at [facility])  . Cataracts, bilateral   . CHF (congestive heart failure) ([place])   . Chronic systolic heart failure ([place])   . Dementia   . GERD (gastroesophageal reflux disease)   . HTN (hypertension)   . Hyperlipidemia   . Ischemic cardiomyopathy    a. EF 30-35% at time of AL STEMI 7/12;   b. IMPROVED by f/u Echo 9/13:  mild LVH, EF 45-50%, mid to dist Inf, AS, Apical HK, Gr 1 diast dysfn, MAC, mod LAE  . Poor short-term memory   . STEMI (ST elevation myocardial infarction) ([place]) 2011; 2012   [place]; [place]    Patient Active Problem List   Diagnosis Date Noted  . Dementia with behavioral disturbance 06/21/2015  . Dementia 03/29/2015  . Body mass index 40.0-44.9, adult ([place]) 12/17/2014  . Morbid obesity ([place]) 12/17/2014  . Depression 09/17/2014  . Calculus of bile duct without mention of cholecystitis or obstruction 10/08/2012  . Abnormal cholangiogram 10/08/2012  . Mucosal abnormality of duodenum 10/08/2012  . Abnormal LFTs (liver function tests) 09/12/2012  . Abdominal pain, epigastric 09/12/2012  . Non-STEMI (non-ST elevated myocardial infarction)  ([place]) 09/11/2012  . CAD (coronary artery disease)   . Myocardial infarction   . Coronary artery disease   . CHF (congestive heart failure) ([place])   . HTN (hypertension)   . Hyperlipidemia   . Ischemic cardiomyopathy     Past Surgical History:  Procedure Laterality Date  . CATARACT EXTRACTION W/ INTRAOCULAR LENS IMPLANT Left ~ 2008  . CHOLECYSTECTOMY N/A 10/07/2012   Procedure: LAPAROSCOPIC CHOLECYSTECTOMY WITH INTRAOPERATIVE CHOLANGIOGRAM;  Surgeon: Shann Medal, MD;  Location: [facility];  Service: General;  Laterality: N/A;  . CORONARY ANGIOPLASTY WITH STENT PLACEMENT  2011; 2012   RCA; RCA & LAD  . DILATION AND CURETTAGE OF UTERUS  1970's  . ERCP N/A 10/08/2012   Procedure: ENDOSCOPIC RETROGRADE CHOLANGIOPANCREATOGRAPHY (ERCP);  Surgeon: Gatha Mayer, MD;  Location: [facility];  Service: Endoscopy;  Laterality: N/A;  . ERCP W/ SPHICTEROTOMY  11/10/2012   stones removed [place]  . TONSILLECTOMY AND ADENOIDECTOMY  1980's    OB History    No data available       Home Medications    Prior to Admission medications   Medication Sig Start Date End Date Taking? Authorizing Provider  acetaminophen (TYLENOL) 500 MG tablet Take 500 mg by mouth every 4 (four) hours as needed for moderate pain or headache.    Historical  Provider, MD  alum & mag hydroxide-simeth ([place]) 200-200-20 MG/5ML suspension Take 30 mLs by mouth every 6 (six) hours as needed for indigestion or heartburn.    Historical Provider, MD  aspirin EC 81 MG tablet Take 1 tablet (81 mg total) by mouth as needed. Patient taking differently: Take 81 mg by mouth daily.  06/27/15   Carrie Mew, MD  atorvastatin (LIPITOR) 80 MG tablet Take 1 tablet (80 mg total) by mouth at bedtime. 06/27/15   Carrie Mew, MD  clonazePAM (KLONOPIN) 0.5 MG tablet Take 0.5 mg by mouth daily.    Historical Provider, MD  clopidogrel (PLAVIX) 75 MG tablet Take 1 tablet (75 mg total) by mouth daily. 06/27/15   Carrie Mew, MD  FLUoxetine  (PROZAC) 10 MG capsule Take 30 mg by mouth daily.     Historical Provider, MD  furosemide (LASIX) 40 MG tablet Take 1 tablet (40 mg total) by mouth every morning. 06/27/15   Carrie Mew, MD  guaifenesin (ROBITUSSIN) 100 MG/5ML syrup Take 200 mg by mouth every 6 (six) hours as needed for cough.     Historical Provider, MD  lisinopril (ZESTRIL) 2.5 MG tablet Take 1 tablet (2.5 mg total) by mouth daily. 06/27/15   Carrie Mew, MD  loperamide (IMODIUM) 2 MG capsule Take 2 mg by mouth daily as needed for diarrhea or loose stools.    Historical Provider, MD  magnesium hydroxide (MILK OF MAGNESIA) 400 MG/5ML suspension Take 30 mLs by mouth at bedtime as needed for moderate constipation.     Historical Provider, MD  Neomycin-Bacitracin-Polymyxin (TRIPLE ANTIBIOTIC) [phone] OINT Apply 1 application topically as needed (wound care).    Historical Provider, MD  nitroGLYCERIN (NITROSTAT) 0.4 MG SL tablet Place 1 tablet (0.4 mg total) under the tongue every 5 (five) minutes as needed for chest pain. 06/27/15   Carrie Mew, MD  risperiDONE (RISPERDAL) 0.25 MG tablet Take 1 tablet (0.25 mg total) by mouth every 6 (six) hours as needed (agitation). 06/27/15   Carrie Mew, MD  risperiDONE (RISPERDAL) 0.5 MG tablet Take 1 tablet (0.5 mg total) by mouth 2 (two) times daily. 06/27/15   Carrie Mew, MD    Family History Family History  Problem Relation Age of Onset  . Heart attack Father     DECEASED  . Stroke Father     DECEASED  . Leukemia Mother     DECEASED  . Heart attack Sister     ALIVE  . Hypertension Sister     Social History Social History  Substance Use Topics  . Smoking status: Former Smoker    Packs/day: 2.00    Years: 30.00    Types: Cigarettes    Quit date: 10/25/2002  . Smokeless tobacco: Never Used  . Alcohol use No     Allergies   Patient has no known allergies.   Review of Systems Review of Systems  Unable to perform ROS: Dementia     Physical  Exam Updated Vital Signs BP 115/67 (BP Location: Right Arm)   Pulse (!) 55   Temp 98.3 F (36.8 C) (Oral)   Resp 16   SpO2 98%   Physical Exam  Constitutional: She appears well-developed and well-nourished.  Non-toxic appearance. No distress.  HENT:  Head: Normocephalic and atraumatic.  No visible signs of trauma on the patient's head. Non-tender to palpation  Eyes: Conjunctivae, EOM and lids are normal. Pupils are equal, round, and reactive to light.  Neck: Normal range of motion. Neck supple. No tracheal deviation  present. No thyroid mass present.  Cardiovascular: Normal rate, regular rhythm and normal heart sounds.  Exam reveals no gallop.   No murmur heard. Pulmonary/Chest: Effort normal and breath sounds normal. No stridor. No respiratory distress. She has no decreased breath sounds. She has no wheezes. She has no rhonchi. She has no rales.  Abdominal: Soft. Normal appearance and bowel sounds are normal. She exhibits no distension. There is no tenderness. There is no rebound and no CVA tenderness.  Musculoskeletal: Normal range of motion. She exhibits no edema or tenderness.       Right knee: She exhibits normal range of motion, no swelling, no effusion and no deformity.       Legs: Neurological: She is alert. She displays no atrophy. No cranial nerve deficit or sensory deficit. GCS eye subscore is 4. GCS verbal subscore is 5. GCS motor subscore is 6.  Skin: Skin is warm and dry. No abrasion and no rash noted.  Psychiatric: Her speech is normal. Her affect is blunt. She is withdrawn. She is inattentive.  Nursing note and vitals reviewed.    ED Treatments / Results  Labs (all labs ordered are listed, but only abnormal results are displayed) Labs Reviewed - No data to display  EKG  EKG Interpretation None       Radiology No results found.  Procedures Procedures (including critical care time)  Medications Ordered in ED Medications - No data to display   Initial  Impression / Assessment and Plan / ED Course  I have reviewed the triage vital signs and the nursing notes.  Pertinent labs & imaging results that were available during my care of the patient were reviewed by me and considered in my medical decision making (see chart for details).  Knee x-ray without acute findings. Stable for discharge back to nursing home Patient has no signs of visible head trauma.. Will not perform a head CT at this time  Final Clinical Impressions(s) / ED Diagnoses   Final diagnoses:  None    New Prescriptions New Prescriptions   No medications on file     Lacretia Leigh, MD 06/02/16 [place], MD 06/02/16 [phone]

## 2016-06-02 NOTE — ED Notes (Signed)
Bed: TW65 Expected date:  Expected time:  Means of arrival:  Comments: Fall, rt knee pain

## 2016-06-02 NOTE — ED Notes (Signed)
Gave report to [facility].

## 2016-06-04 DIAGNOSIS — G300 Alzheimer's disease with early onset: Secondary | ICD-10-CM | POA: Diagnosis not present

## 2016-06-04 DIAGNOSIS — R69 Illness, unspecified: Secondary | ICD-10-CM | POA: Diagnosis not present

## 2016-06-04 DIAGNOSIS — M1711 Unilateral primary osteoarthritis, right knee: Secondary | ICD-10-CM | POA: Diagnosis not present

## 2016-06-04 DIAGNOSIS — R4182 Altered mental status, unspecified: Secondary | ICD-10-CM | POA: Diagnosis not present

## 2016-06-04 DIAGNOSIS — E782 Mixed hyperlipidemia: Secondary | ICD-10-CM | POA: Diagnosis not present

## 2016-06-06 DIAGNOSIS — R319 Hematuria, unspecified: Secondary | ICD-10-CM | POA: Diagnosis not present

## 2016-06-06 DIAGNOSIS — R4182 Altered mental status, unspecified: Secondary | ICD-10-CM | POA: Diagnosis not present

## 2016-06-11 DIAGNOSIS — G4700 Insomnia, unspecified: Secondary | ICD-10-CM | POA: Diagnosis not present

## 2016-06-11 DIAGNOSIS — G300 Alzheimer's disease with early onset: Secondary | ICD-10-CM | POA: Diagnosis not present

## 2016-06-11 DIAGNOSIS — R69 Illness, unspecified: Secondary | ICD-10-CM | POA: Diagnosis not present

## 2016-06-14 DIAGNOSIS — R69 Illness, unspecified: Secondary | ICD-10-CM | POA: Diagnosis not present

## 2016-06-14 DIAGNOSIS — G300 Alzheimer's disease with early onset: Secondary | ICD-10-CM | POA: Diagnosis not present

## 2016-06-14 DIAGNOSIS — R4182 Altered mental status, unspecified: Secondary | ICD-10-CM | POA: Diagnosis not present

## 2016-07-08 ENCOUNTER — Observation Stay (HOSPITAL_COMMUNITY)
Admission: EM | Admit: 2016-07-08 | Discharge: 2016-07-10 | Disposition: A | Discharge status: Skilled Nursing Facility | Payer: Medicare HMO | Attending: Internal Medicine | Admitting: Internal Medicine

## 2016-07-08 ENCOUNTER — Encounter (HOSPITAL_COMMUNITY): Payer: Self-pay | Admitting: Emergency Medicine

## 2016-07-08 DIAGNOSIS — I252 Old myocardial infarction: Secondary | ICD-10-CM | POA: Diagnosis not present

## 2016-07-08 DIAGNOSIS — I130 Hypertensive heart and chronic kidney disease with heart failure and stage 1 through stage 4 chronic kidney disease, or unspecified chronic kidney disease: Secondary | ICD-10-CM | POA: Insufficient documentation

## 2016-07-08 DIAGNOSIS — I5022 Chronic systolic (congestive) heart failure: Secondary | ICD-10-CM

## 2016-07-08 DIAGNOSIS — D62 Acute posthemorrhagic anemia: Secondary | ICD-10-CM

## 2016-07-08 DIAGNOSIS — N183 Chronic kidney disease, stage 3 unspecified: Secondary | ICD-10-CM | POA: Diagnosis present

## 2016-07-08 DIAGNOSIS — F02818 Dementia in other diseases classified elsewhere, unspecified severity, with other behavioral disturbance: Secondary | ICD-10-CM | POA: Diagnosis present

## 2016-07-08 DIAGNOSIS — I10 Essential (primary) hypertension: Secondary | ICD-10-CM | POA: Diagnosis not present

## 2016-07-08 DIAGNOSIS — I255 Ischemic cardiomyopathy: Secondary | ICD-10-CM | POA: Diagnosis not present

## 2016-07-08 DIAGNOSIS — D649 Anemia, unspecified: Secondary | ICD-10-CM | POA: Diagnosis not present

## 2016-07-08 DIAGNOSIS — G309 Alzheimer's disease, unspecified: Secondary | ICD-10-CM | POA: Insufficient documentation

## 2016-07-08 DIAGNOSIS — I2510 Atherosclerotic heart disease of native coronary artery without angina pectoris: Secondary | ICD-10-CM | POA: Diagnosis not present

## 2016-07-08 DIAGNOSIS — G301 Alzheimer's disease with late onset: Secondary | ICD-10-CM | POA: Diagnosis not present

## 2016-07-08 DIAGNOSIS — Z6834 Body mass index (BMI) 34.0-34.9, adult: Secondary | ICD-10-CM | POA: Diagnosis not present

## 2016-07-08 DIAGNOSIS — F329 Major depressive disorder, single episode, unspecified: Secondary | ICD-10-CM | POA: Insufficient documentation

## 2016-07-08 DIAGNOSIS — F0281 Dementia in other diseases classified elsewhere with behavioral disturbance: Secondary | ICD-10-CM

## 2016-07-08 DIAGNOSIS — R69 Illness, unspecified: Secondary | ICD-10-CM | POA: Diagnosis not present

## 2016-07-08 DIAGNOSIS — Z7902 Long term (current) use of antithrombotics/antiplatelets: Secondary | ICD-10-CM | POA: Diagnosis not present

## 2016-07-08 DIAGNOSIS — K921 Melena: Secondary | ICD-10-CM | POA: Diagnosis not present

## 2016-07-08 DIAGNOSIS — I5042 Chronic combined systolic (congestive) and diastolic (congestive) heart failure: Secondary | ICD-10-CM | POA: Diagnosis not present

## 2016-07-08 DIAGNOSIS — K922 Gastrointestinal hemorrhage, unspecified: Secondary | ICD-10-CM | POA: Diagnosis present

## 2016-07-08 DIAGNOSIS — F0280 Dementia in other diseases classified elsewhere without behavioral disturbance: Secondary | ICD-10-CM | POA: Diagnosis not present

## 2016-07-08 DIAGNOSIS — K625 Hemorrhage of anus and rectum: Secondary | ICD-10-CM | POA: Diagnosis not present

## 2016-07-08 DIAGNOSIS — Z955 Presence of coronary angioplasty implant and graft: Secondary | ICD-10-CM | POA: Insufficient documentation

## 2016-07-08 DIAGNOSIS — I2583 Coronary atherosclerosis due to lipid rich plaque: Secondary | ICD-10-CM | POA: Diagnosis not present

## 2016-07-08 DIAGNOSIS — Z79899 Other long term (current) drug therapy: Secondary | ICD-10-CM | POA: Diagnosis not present

## 2016-07-08 DIAGNOSIS — R402411 Glasgow coma scale score 13-15, in the field [EMT or ambulance]: Secondary | ICD-10-CM | POA: Diagnosis not present

## 2016-07-08 DIAGNOSIS — Z87891 Personal history of nicotine dependence: Secondary | ICD-10-CM | POA: Diagnosis not present

## 2016-07-08 DIAGNOSIS — E785 Hyperlipidemia, unspecified: Secondary | ICD-10-CM | POA: Diagnosis not present

## 2016-07-08 LAB — CBC
HCT: 33 % — ABNORMAL LOW (ref 36.0–46.0)
Hemoglobin: 10.6 g/dL — ABNORMAL LOW (ref 12.0–15.0)
MCH: 28.7 pg (ref 26.0–34.0)
MCHC: 32.1 g/dL (ref 30.0–36.0)
MCV: 89.4 fL (ref 78.0–100.0)
PLATELETS: 181 10*3/uL (ref 150–400)
RBC: 3.69 MIL/uL — ABNORMAL LOW (ref 3.87–5.11)
RDW: 14.9 % (ref 11.5–15.5)
WBC: 8.2 10*3/uL (ref 4.0–10.5)

## 2016-07-08 LAB — CBC WITH DIFFERENTIAL/PLATELET
BASOS ABS: 0 10*3/uL (ref 0.0–0.1)
BASOS PCT: 0 %
EOS ABS: 0.2 10*3/uL (ref 0.0–0.7)
Eosinophils Relative: 2 %
HEMATOCRIT: 33.7 % — AB (ref 36.0–46.0)
Hemoglobin: 10.8 g/dL — ABNORMAL LOW (ref 12.0–15.0)
Lymphocytes Relative: 25 %
Lymphs Abs: 2.3 10*3/uL (ref 0.7–4.0)
MCH: 28.9 pg (ref 26.0–34.0)
MCHC: 32 g/dL (ref 30.0–36.0)
MCV: 90.1 fL (ref 78.0–100.0)
MONO ABS: 1.1 10*3/uL — AB (ref 0.1–1.0)
Monocytes Relative: 12 %
Neutro Abs: 5.6 10*3/uL (ref 1.7–7.7)
Neutrophils Relative %: 61 %
Platelets: 204 10*3/uL (ref 150–400)
RBC: 3.74 MIL/uL — ABNORMAL LOW (ref 3.87–5.11)
RDW: 15.1 % (ref 11.5–15.5)
WBC: 9.2 10*3/uL (ref 4.0–10.5)

## 2016-07-08 LAB — I-STAT CHEM 8, ED
BUN: 32 mg/dL — ABNORMAL HIGH (ref 6–20)
CREATININE: 1.2 mg/dL — AB (ref 0.44–1.00)
Calcium, Ion: 1.13 mmol/L — ABNORMAL LOW (ref 1.15–1.40)
Chloride: 106 mmol/L (ref 101–111)
Glucose, Bld: 89 mg/dL (ref 65–99)
HEMATOCRIT: 32 % — AB (ref 36.0–46.0)
HEMOGLOBIN: 10.9 g/dL — AB (ref 12.0–15.0)
POTASSIUM: 4.2 mmol/L (ref 3.5–5.1)
SODIUM: 140 mmol/L (ref 135–145)
TCO2: 25 mmol/L (ref 0–100)

## 2016-07-08 LAB — COMPREHENSIVE METABOLIC PANEL
ALT: 13 U/L — AB (ref 14–54)
ANION GAP: 8 (ref 5–15)
AST: 19 U/L (ref 15–41)
Albumin: 3.6 g/dL (ref 3.5–5.0)
Alkaline Phosphatase: 74 U/L (ref 38–126)
BUN: 31 mg/dL — ABNORMAL HIGH (ref 6–20)
CALCIUM: 9.1 mg/dL (ref 8.9–10.3)
CO2: 23 mmol/L (ref 22–32)
CREATININE: 1.17 mg/dL — AB (ref 0.44–1.00)
Chloride: 107 mmol/L (ref 101–111)
GFR, EST AFRICAN AMERICAN: 53 mL/min — AB (ref >60)
GFR, EST NON AFRICAN AMERICAN: 45 mL/min — AB (ref >60)
Glucose, Bld: 91 mg/dL (ref 65–99)
Potassium: 4.2 mmol/L (ref 3.5–5.1)
SODIUM: 138 mmol/L (ref 135–145)
Total Bilirubin: 0.4 mg/dL (ref 0.3–1.2)
Total Protein: 6.5 g/dL (ref 6.5–8.1)

## 2016-07-08 LAB — TYPE AND SCREEN
ABO/RH(D): O POS
ANTIBODY SCREEN: NEGATIVE

## 2016-07-08 LAB — ABO/RH: ABO/RH(D): O POS

## 2016-07-08 LAB — MRSA PCR SCREENING: MRSA by PCR: NEGATIVE

## 2016-07-08 LAB — POC OCCULT BLOOD, ED: Fecal Occult Bld: POSITIVE — AB

## 2016-07-08 MED ORDER — RISPERIDONE 1 MG PO TABS
1.0000 mg | ORAL_TABLET | Freq: Every day | ORAL | Status: DC
  Administered 2016-07-08 – 2016-07-09 (×2): 1 mg via ORAL
  Filled 2016-07-08 (×2): qty 1

## 2016-07-08 MED ORDER — SODIUM CHLORIDE 0.9 % IV SOLN
INTRAVENOUS | Status: DC
  Administered 2016-07-08 – 2016-07-09 (×2): via INTRAVENOUS

## 2016-07-08 MED ORDER — FLUOXETINE HCL 20 MG PO CAPS
40.0000 mg | ORAL_CAPSULE | Freq: Every day | ORAL | Status: DC
  Administered 2016-07-08 – 2016-07-10 (×3): 40 mg via ORAL
  Filled 2016-07-08 (×3): qty 2

## 2016-07-08 MED ORDER — ATORVASTATIN CALCIUM 40 MG PO TABS
80.0000 mg | ORAL_TABLET | Freq: Every day | ORAL | Status: DC
  Administered 2016-07-08 – 2016-07-09 (×2): 80 mg via ORAL
  Filled 2016-07-08 (×2): qty 2

## 2016-07-08 MED ORDER — SODIUM CHLORIDE 0.9 % IV BOLUS (SEPSIS)
1000.0000 mL | Freq: Once | INTRAVENOUS | Status: AC
  Administered 2016-07-08: 1000 mL via INTRAVENOUS

## 2016-07-08 MED ORDER — MEMANTINE HCL 10 MG PO TABS
5.0000 mg | ORAL_TABLET | Freq: Two times a day (BID) | ORAL | Status: DC
  Administered 2016-07-08 – 2016-07-10 (×5): 5 mg via ORAL
  Filled 2016-07-08 (×5): qty 1

## 2016-07-08 MED ORDER — RIVASTIGMINE TARTRATE 1.5 MG PO CAPS
1.5000 mg | ORAL_CAPSULE | Freq: Two times a day (BID) | ORAL | Status: DC
  Administered 2016-07-08 – 2016-07-10 (×5): 1.5 mg via ORAL
  Filled 2016-07-08 (×5): qty 1

## 2016-07-08 MED ORDER — MELATONIN 5 MG PO TABS: 5.0000 mg | ORAL_TABLET | Freq: Every day | ORAL | Status: DC

## 2016-07-08 MED ORDER — LISINOPRIL 5 MG PO TABS: 2.5000 mg | ORAL_TABLET | Freq: Every day | ORAL | Status: DC

## 2016-07-08 MED ORDER — FUROSEMIDE 40 MG PO TABS
40.0000 mg | ORAL_TABLET | Freq: Every morning | ORAL | Status: DC
  Administered 2016-07-08 – 2016-07-10 (×3): 40 mg via ORAL
  Filled 2016-07-08 (×4): qty 1

## 2016-07-08 NOTE — Progress Notes (Addendum)
Patient arrived on the unit at approximately 0424. She is and oriented to self only. She is very confused and made several attempt to get OOB unassisted. Low bed and floor mats placed for safety. Had a large BM with blood clots this morning. Unable to complete admission because of patient's poor cognitive status due to dementia. No paper work from facility was received

## 2016-07-08 NOTE — ED Triage Notes (Signed)
Per EMS , pt. From [facility]  with complaint of rectal bleeding which noted by staff upon giving bath to pt. Last night at 11pm. Reported that blood clot was found on pt.'s underwear, no active bleeding noted upon arrival to ED/ pt. Denied pain, pt. Has dementia. Taking aspirin daily.

## 2016-07-08 NOTE — H&P (Signed)
History and Physical  Patient Name: Kaitlyn Simmons     OFB:[id number]    DOB: Jan 21, 1946    DOA: 07/08/2016 PCP: Arlis Porta., MD   Patient coming from: [facility]  Chief Complaint: Rectal bleeding  HPI: Kaitlyn Simmons is a 71 y.o. female with a past medical history significant for advanced dementia, CAD s/p PCI last in 2012, CHF EF 45%, and CKD III who presents with rectal bleeding from SNF.  Caveat that all history collected from medics via [place] and chart due to patient dementia.  Evidently tonight, when staff at her facility went to change the patient for bed, they noticed that she had had bright red rectal bleeding sometime during the day or evening.  She had had no previous episodes and was otherwise normal to staff.  ED course: -Afebrile, heart rate 61, respirations and pulse ox normal, blood pressure 100/40 -Na 138, K 4.2, Cr 1.1 (baseline 1.1), WBC 9.2K, Hgb 10.8 (baseline 11 nine months ago) -Rectal exam with gross blood on exam, anoscope not done, no further bleeding in ER -She was given IV fluids and TRH were asked to observe         ROS: Review of Systems  Unable to perform ROS: Dementia          Past Medical History:  Diagnosis Date  . CAD (coronary artery disease)    a. s/p IL STEMI 7/11 => tx with BMS to RCA (tx at [facility]);  b.  AL STEMI 7/12 => LHC: mRCA 100% ==> PCI with BMS to RCA; LM 10%, pCFX 50%, OM1 10%, pLAD 50%, mLAD 99%, 70%  ==>  c. staged PCI: BMS to  LAD (tx at [facility])  . Cataracts, bilateral   . CHF (congestive heart failure) ([place])   . Chronic systolic heart failure ([place])   . Dementia   . GERD (gastroesophageal reflux disease)   . HTN (hypertension)   . Hyperlipidemia   . Ischemic cardiomyopathy    a. EF 30-35% at time of AL STEMI 7/12;   b. IMPROVED by f/u Echo 9/13:  mild LVH, EF 45-50%, mid to dist Inf, AS, Apical HK, Gr 1 diast dysfn, MAC, mod LAE  . Poor short-term memory   . STEMI (ST elevation myocardial infarction) ([place])  2011; 2012   [place]; [place]    Past Surgical History:  Procedure Laterality Date  . CATARACT EXTRACTION W/ INTRAOCULAR LENS IMPLANT Left ~ 2008  . CHOLECYSTECTOMY N/A 10/07/2012   Procedure: LAPAROSCOPIC CHOLECYSTECTOMY WITH INTRAOPERATIVE CHOLANGIOGRAM;  Surgeon: Shann Medal, MD;  Location: [facility];  Service: General;  Laterality: N/A;  . CORONARY ANGIOPLASTY WITH STENT PLACEMENT  2011; 2012   RCA; RCA & LAD  . DILATION AND CURETTAGE OF UTERUS  1970's  . ERCP N/A 10/08/2012   Procedure: ENDOSCOPIC RETROGRADE CHOLANGIOPANCREATOGRAPHY (ERCP);  Surgeon: Gatha Mayer, MD;  Location: [facility];  Service: Endoscopy;  Laterality: N/A;  . ERCP W/ SPHICTEROTOMY  11/10/2012   stones removed [place]  . TONSILLECTOMY AND ADENOIDECTOMY  1980's    Social History: Patient lives at [facility].  The patient is evidently a former smoker.    No Known Allergies  Family history: family history includes Heart attack in her father and sister; Hypertension in her sister; Leukemia in her mother; Stroke in her father.  Prior to Admission medications   Medication Sig Start Date End Date Taking? Authorizing Provider  acetaminophen (TYLENOL) 500 MG tablet Take 500 mg by mouth every 4 (four)  hours as needed for moderate pain or headache.   Yes [provider]  alum & mag hydroxide-simeth ([place]) 200-200-20 MG/5ML suspension Take 30 mLs by mouth every 6 (six) hours as needed for indigestion or heartburn.   Yes [provider]  aspirin EC 81 MG tablet Take 1 tablet (81 mg total) by mouth as needed. Patient taking differently: Take 81 mg by mouth daily.  06/27/15  Yes Carrie Mew, MD  atorvastatin (LIPITOR) 80 MG tablet Take 1 tablet (80 mg total) by mouth at bedtime. 06/27/15  Yes Carrie Mew, MD  clopidogrel (PLAVIX) 75 MG tablet Take 1 tablet (75 mg total) by mouth daily. 06/27/15  Yes Carrie Mew, MD  FLUoxetine (PROZAC) 10 MG capsule Take 40 mg by mouth daily.    Yes [provider]  furosemide (LASIX) 40 MG tablet Take 1 tablet (40 mg total) by mouth every morning. 06/27/15  Yes Carrie Mew, MD  guaifenesin (ROBITUSSIN) 100 MG/5ML syrup Take 200 mg by mouth every 6 (six) hours as needed for cough.    Yes [provider]  lisinopril (ZESTRIL) 2.5 MG tablet Take 1 tablet (2.5 mg total) by mouth daily. 06/27/15  Yes Carrie Mew, MD  loperamide (IMODIUM) 2 MG capsule Take 2 mg by mouth daily as needed for diarrhea or loose stools.   Yes [provider]  magnesium hydroxide (MILK OF MAGNESIA) 400 MG/5ML suspension Take 30 mLs by mouth at bedtime as needed for moderate constipation.    Yes [provider]  Melatonin 5 MG TABS Take 5 mg by mouth at bedtime.   Yes [provider]  memantine (NAMENDA) 5 MG tablet Take 5 mg by mouth 2 (two) times daily.   Yes [provider]  Multiple Vitamins-Minerals (OCUVITE ADULT 50+) CAPS Take 1 capsule by mouth every morning.   Yes [provider]  Neomycin-Bacitracin-Polymyxin (TRIPLE ANTIBIOTIC) [phone] OINT Apply 1 application topically as needed (wound care).   Yes [provider]  nitroGLYCERIN (NITROSTAT) 0.4 MG SL tablet Place 1 tablet (0.4 mg total) under the tongue every 5 (five) minutes as needed for chest pain. 06/27/15  Yes Carrie Mew, MD  risperiDONE (RISPERDAL) 1 MG tablet Take 1 mg by mouth at bedtime.   Yes [provider]  rivastigmine (EXELON) 1.5 MG capsule Take 1.5 mg by mouth 2 (two) times daily.   Yes [provider]       Physical Exam: BP 126/64   Pulse 62   Temp 98 F (36.7 C) (Oral)   Resp (!) 27   SpO2 96%  General appearance: Frail elderly adult female, awake, not oriented to person, place or time. Makes mostly incoherent responses to questions. Eyes: Anicteric, conjunctiva pink, lids and lashes normal. PERRL.    ENT: No nasal deformity, discharge, epistaxis.  Hearing seems normal. OP dry MM  without lesions.   Neck: No neck masses.  Trachea midline.  No thyromegaly/tenderness. Lymph: No cervical or supraclavicular lymphadenopathy. Skin: Warm and dry.  No jaundice.  No suspicious rashes or lesions. Cardiac: RRR, nl S1-S2, no murmurs appreciated.  Capillary refill is brisk.  JVP not visible.  No LE edema.  Radial and DP pulses 2+ and symmetric. Respiratory: Normal respiratory rate and rhythm.  CTAB without rales or wheezes. Abdomen: Abdomen soft.  No TTP. No ascites, distension, hepatosplenomegaly.   MSK: No deformities or effusions.  No cyanosis or clubbing. Neuro: Face symmetric, pupils reactive.  EOM appear intact.  Speech is fluent.  Muscle strength  globally weak.    Psych: Not oriented to person, place or time.  Mostly incoherent.     Labs on Admission:  I have personally reviewed following labs and imaging studies: CBC:  Recent Labs Lab 07/08/16 0200 07/08/16 0213  WBC 9.2  --   NEUTROABS 5.6  --   HGB 10.8* 10.9*  HCT 33.7* 32.0*  MCV 90.1  --   PLT 204  --    Basic Metabolic Panel:  Recent Labs Lab 07/08/16 0200 07/08/16 0213  NA 138 140  K 4.2 4.2  CL 107 106  CO2 23  --   GLUCOSE 91 89  BUN 31* 32*  CREATININE 1.17* 1.20*  CALCIUM 9.1  --    GFR: CrCl cannot be calculated (Unknown ideal weight.).  Liver Function Tests:  Recent Labs Lab 07/08/16 0200  AST 19  ALT 13*  ALKPHOS 74  BILITOT 0.4  PROT 6.5  ALBUMIN 3.6   No results for input(s): LIPASE, AMYLASE in the last 168 hours. No results for input(s): AMMONIA in the last 168 hours. Coagulation Profile: No results for input(s): INR, PROTIME in the last 168 hours. Cardiac Enzymes: No results for input(s): CKTOTAL, CKMB, CKMBINDEX, TROPONINI in the last 168 hours. BNP (last 3 results) No results for input(s): PROBNP in the last 8760 hours. HbA1C: No results for input(s): HGBA1C in the last 72 hours. CBG: No results for input(s): GLUCAP in the last 168 hours. Lipid Profile: No  results for input(s): CHOL, HDL, LDLCALC, TRIG, CHOLHDL, LDLDIRECT in the last 72 hours. Thyroid Function Tests: No results for input(s): TSH, T4TOTAL, FREET4, T3FREE, THYROIDAB in the last 72 hours. Anemia Panel: No results for input(s): VITAMINB12, FOLATE, FERRITIN, TIBC, IRON, RETICCTPCT in the last 72 hours. Sepsis Labs: Invalid input(s): PROCALCITONIN, LACTICIDVEN No results found for this or any previous visit (from the past 240 hour(s)).       Echocardiogram 2013: EF 45%         Assessment/Plan  1. Lower GI bleed:  Diverticular vs hemorrhoidal vs AVM?  No colonoscopy in our system that I am able to see. ERCP in 2014 showed no gastric ulcers. -Check CBC in AM -Monitor for ongoing bleeding overnight -Hold Plavix and aspirin given out 6 years from last stent -If no more bleeding, will discuss with family risks/benefits of colonoscopy given her advanced dementia   2. CAD and CHF:  EF 45%, euvolemic, stable. -Hold Plavix and aspirin -Continue statin -Continue furosemide -Continue lisinopril  3. Dementia:  -Continue melatonin -Continue Namenda -Continue risperidone -Continue Exelon patch -Continue Prozac  4. CKD III:  Stable  5. Anemia:  Normocytic, likely from chronic disease. -Check CBC in AM         DVT prophylaxis: SCDs  Code Status: FULL by default Family Communication: None present  Disposition Plan: Anticipate IV fluids Consults called: None overnight Admission status: OBS At the point of initial evaluation, it is my clinical opinion that admission for OBSERVATION is reasonable and necessary because the patient's presenting complaints in the context of their chronic conditions represent sufficient risk of deterioration or significant morbidity to constitute reasonable grounds for close observation in the hospital setting, but that the patient may be medically stable for discharge from the hospital within 24 to 48 hours.    Medical decision  making: Patient seen at 3:30 AM on 07/08/2016.  The patient was discussed with Dr. Tyrone Nine.  What exists of the patient's chart was reviewed in depth and summarized above.  Clinical condition:  stable.        Edwin Dada [facility] Pager [phone]

## 2016-07-08 NOTE — ED Provider Notes (Signed)
[place] [facility] DEPT Provider Note   CSN: [id number] Arrival date & time: 07/08/16  0041  By signing my name below, I, Kaitlyn Simmons, attest that this documentation has been prepared under the direction and in the presence of Kaitlyn Etienne, DO. Electronically Signed: Oleh Simmons, Scribe. 07/08/16. 3:30 AM.   History   Chief Complaint Chief Complaint  Patient presents with  . Rectal Bleeding   LEVEL 5 CAVEAT DUE TO DEMENTIA  HPI Kaitlyn Simmons is a 71 y.o. female with history of ischemic cardiomyopathy requiring stenting on 61m aspirin, CHF, and dementia who presents to the ED with frank rectal bleeding from skilled nursing facility. According to medic report, the patient first reported rectal bleeding 24 hours ago. No active bleeding during transport. Patient denies pain currently. She is not anticoagulated according to records.  A complete history is limited by dementia.  71yo F with a chief complaint of bright red blood per rectum.   The history is provided by the nursing home and the EMS personnel. The history is limited by the condition of the patient. No language interpreter was used.    Past Medical History:  Diagnosis Date  . CAD (coronary artery disease)    a. s/p IL STEMI 7/11 => [facility];  b.  AL STEMI 7/12 => LHC: mRCA 100% ==> PCI with BMS to RCA; LM 10%, pCFX 50%, OM1 10%, pLAD 50%, mLAD 99%, 70%  ==>  c. [facility]  . Cataracts, bilateral   . CHF ([place]ive heart failure) ([place]   . Chronic systolic heart failure ([place]   . Dementia   . GERD (gastroesophageal reflux disease)   . HTN (hypertension)   . Hyperlipidemia   . Ischemic cardiomyopathy    a. EF 30-35% at time of AL STEMI 7/12;   b. IMPROVED by f/u Echo 9/13:  mild LVH, EF 45-50%, mid to dist Inf, AS, Apical HK, Gr 1 diast dysfn, MAC, mod LAE  . Poor short-term memory   . STEM[place] (ST elevation [place] infarction) ([place] 2011; 2012   [place]; [place]     Patient Active Problem List   Diagnosis Date Noted  . Lower GI bleed 07/08/2016  . CKD (chronic kidney disease), stage III 07/08/2016  . Normocytic anemia 07/08/2016  . Alzheimer's dementia, late onset, with behavioral disturbance 06/21/2015  . Body mass index 40.0-44.9, adult ([place] 12/17/2014  . Morbid obesity ([place] 12/17/2014  . Depression 09/17/2014  . Calculus of bile duct without mention of cholecystitis or obstruction 10/08/2012  . Abnormal cholangiogram 10/08/2012  . Mucosal abnormality of duodenum 10/08/2012  . Coronary artery disease due to lipid rich plaque   . Chronic systolic CHF (congestive heart failure) ([place]   . Essential hypertension   . Hyperlipidemia   . Ischemic cardiomyopathy     Past Surgical History:  Procedure Laterality Date  . CATARACT EXTRACTION W/ INTRAOCULAR LENS IMPLANT Left ~ 2008  . CHOLECYSTECTOMY N/A 10/07/2012   Procedure: LAPAROSCOPIC CHOLECYSTECTOMY WITH INTRAOPERATIVE CHOLANGIOGRAM;  Surgeon: DShann Medal MD;  Location: [facility];  Service: General;  Laterality: N/A;  . CORONARY ANGIOPLASTY WITH STENT PLACEMENT  2011; 2012   RCA; RCA & LAD  . DILATION AND CURETTAGE OF UTERUS  1970's  . ERCP N/A 10/08/2012   Procedure: ENDOSCOPIC RETROGRADE CHOLANGIOPANCREATOGRAPHY (ERCP);  Surgeon: CGatha Mayer MD;  [facility];  Service: Endoscopy;  Laterality: N/A;  . ERCP W/ SPHICTEROTOMY  11/10/2012  stones removed [place]  . TONSILLECTOMY AND ADENOIDECTOMY  1980's    OB History    No data available       Home Medications    Prior to Admission medications   Medication Sig Start Date End Date Taking? Authorizing Provider  acetaminophen (TYLENOL) 500 MG tablet Take 500 mg by mouth every 4 (four) hours as needed for moderate pain or headache.   Yes [provider]  alum & mag hydroxide-simeth ([place]) 200-200-20 MG/5ML suspension Take 30 mLs by mouth every 6 (six) hours as needed for indigestion or heartburn.   Yes [provider]  aspirin EC 81 MG tablet Take 1 tablet (81 mg total) by mouth as needed. Patient taking differently: Take 81 mg by mouth daily.  06/27/15  Yes Carrie Mew, MD  atorvastatin (LIPITOR) 80 MG tablet Take 1 tablet (80 mg total) by mouth at bedtime. 06/27/15  Yes Carrie Mew, MD  clopidogrel (PLAVIX) 75 MG tablet Take 1 tablet (75 mg total) by mouth daily. 06/27/15  Yes Carrie Mew, MD  FLUoxetine (PROZAC) 10 MG capsule Take 40 mg by mouth daily.    Yes [provider]  furosemide (LASIX) 40 MG tablet Take 1 tablet (40 mg total) by mouth every morning. 06/27/15  Yes Carrie Mew, MD  guaifenesin (ROBITUSSIN) 100 MG/5ML syrup Take 200 mg by mouth every 6 (six) hours as needed for cough.    Yes [provider]  lisinopril (ZESTRIL) 2.5 MG tablet Take 1 tablet (2.5 mg total) by mouth daily. 06/27/15  Yes Carrie Mew, MD  loperamide (IMODIUM) 2 MG capsule Take 2 mg by mouth daily as needed for diarrhea or loose stools.   Yes [provider]  magnesium hydroxide (MILK OF MAGNESIA) 400 MG/5ML suspension Take 30 mLs by mouth at bedtime as needed for moderate constipation.    Yes [provider]  Melatonin 5 MG TABS Take 5 mg by mouth at bedtime.   Yes [provider]  memantine (NAMENDA) 5 MG tablet Take 5 mg by mouth 2 (two) times daily.   Yes [provider]  Multiple Vitamins-Minerals (OCUVITE ADULT 50+) CAPS Take 1 capsule by mouth every morning.   Yes [provider]  Neomycin-Bacitracin-Polymyxin (TRIPLE ANTIBIOTIC) [phone] OINT Apply 1 application topically as needed (wound care).   Yes [provider]  nitroGLYCERIN (NITROSTAT) 0.4 MG SL tablet Place 1 tablet (0.4 mg total) under the tongue every 5 (five) minutes as needed for chest pain. 06/27/15  Yes Carrie Mew, MD  risperiDONE (RISPERDAL) 1 MG tablet Take 1 mg by mouth at bedtime.   Yes [provider]  rivastigmine (EXELON)  1.5 MG capsule Take 1.5 mg by mouth 2 (two) times daily.   Yes [provider]  risperiDONE (RISPERDAL) 0.25 MG tablet Take 1 tablet (0.25 mg total) by mouth every 6 (six) hours as needed (agitation). Patient not taking: Reported on 07/08/2016 06/27/15   Carrie Mew, MD  risperiDONE (RISPERDAL) 0.5 MG tablet Take 1 tablet (0.5 mg total) by mouth 2 (two) times daily. Patient not taking: Reported on 07/08/2016 06/27/15   Carrie Mew, MD    Family History Family History  Problem Relation Age of Onset  . Heart attack Father        DECEASED  . Stroke Father        DECEASED  . Leukemia Mother        DECEASED  . Heart attack Sister        ALIVE  .  Hypertension Sister     Social History Social History  Substance Use Topics  . Smoking status: Former Smoker    Packs/day: 2.00    Years: 30.00    Types: Cigarettes    Quit date: 10/25/2002  . Smokeless tobacco: Never Used  . Alcohol use No     Allergies   Patient has no known allergies.   Review of Systems Review of Systems  Unable to perform ROS: Dementia     Physical Exam Updated Vital Signs BP 136/84   Pulse 69   Temp 98 F (36.7 C) (Oral)   Resp 19   SpO2 96%   Physical Exam  Constitutional: She is oriented to person, place, and time. She appears well-developed and well-nourished. No distress.  HENT:  Head: Normocephalic and atraumatic.  Eyes: EOM are normal. Pupils are equal, round, and reactive to light.  Neck: Normal range of motion. Neck supple.  Cardiovascular: Normal rate and regular rhythm.  Exam reveals no gallop and no friction rub.   No murmur heard. Pulmonary/Chest: Effort normal. She has no wheezes. She has no rales.  Abdominal: Soft. She exhibits no distension. There is no tenderness.  Genitourinary:  Genitourinary Comments: Rectal: Gross blood on glove.  Musculoskeletal: She exhibits no edema or tenderness.  Neurological: She is alert and oriented to person, place, and time.  Skin:  Skin is warm and dry. She is not diaphoretic.  Psychiatric: She has a normal mood and affect. Her behavior is normal.  Nursing note and vitals reviewed.    ED Treatments / Results  Labs (all labs ordered are listed, but only abnormal results are displayed) Labs Reviewed  COMPREHENSIVE METABOLIC PANEL - Abnormal; Notable for the following:       Result Value   BUN 31 (*)    Creatinine, Ser 1.17 (*)    ALT 13 (*)    GFR calc non Af Amer 45 (*)    GFR calc Af Amer 53 (*)    All other components within normal limits  CBC WITH DIFFERENTIAL/PLATELET - Abnormal; Notable for the following:    RBC 3.74 (*)    Hemoglobin 10.8 (*)    HCT 33.7 (*)    Monocytes Absolute 1.1 (*)    All other components within normal limits  POC OCCULT BLOOD, ED - Abnormal; Notable for the following:    Fecal Occult Bld POSITIVE (*)    All other components within normal limits  I-STAT CHEM 8, ED - Abnormal; Notable for the following:    BUN 32 (*)    Creatinine, Ser 1.20 (*)    Calcium, Ion 1.13 (*)    Hemoglobin 10.9 (*)    HCT 32.0 (*)    All other components within normal limits  TYPE AND SCREEN  ABO/RH    EKG  EKG Interpretation None       Radiology No results found.  Procedures Procedures (including critical care time)  Medications Ordered in ED Medications  sodium chloride 0.9 % bolus 1,000 mL (1,000 mLs Intravenous New Bag/Given 07/08/16 0159)     Initial Impression / Assessment and Plan / ED Course  I have reviewed the triage vital signs and the nursing notes.  Pertinent labs & imaging results that were available during my care of the patient were reviewed by me and considered in my medical decision making (see chart for details).     71 yo F With a chief complaints of a likely lower GI bleed. Patient is unsure how  long she has had the symptoms. Denies any abdominal pain. No pain on exam. Gross blood on rectal exam.  The patients results and plan were reviewed and discussed.    Any x-rays performed were independently reviewed by myself.   Differential diagnosis were considered with the presenting HPI.  Medications  sodium chloride 0.9 % bolus 1,000 mL (1,000 mLs Intravenous New Bag/Given 07/08/16 0159)    Vitals:   07/08/16 0059 07/08/16 0106 07/08/16 0328  BP:  (!) 100/40 136/84  Pulse:  61 69  Resp:  16 19  Temp:  98 F (36.7 C)   TempSrc:  Oral   SpO2: 98% 97% 96%    Final diagnoses:  Rectal bleeding  Lower GI bleed    Admission/ observation were discussed with the admitting physician, patient and/or family and they are comfortable with the plan.        Final Clinical Impressions(s) / ED Diagnoses   Final diagnoses:  Rectal bleeding  Lower GI bleed    New Prescriptions New Prescriptions   No medications on file  I personally performed the services described in this documentation, which was scribed in my presence. The recorded information has been reviewed and is accurate.     Kaitlyn Etienne, DO 07/08/16 0330

## 2016-07-08 NOTE — ED Notes (Signed)
Bed: MO29 Expected date:  Expected time:  Means of arrival:  Comments: 75f rectal bleeding

## 2016-07-08 NOTE — Consult Note (Signed)
Referring Provider: [facility]  Primary Care Physician:  Arlis Porta., MD Primary Gastroenterologist:   Reason for Consultation: rectal bleeding  ASSESSMENT AND PLAN:   100. 71 yo female admitted from SNF with hematochezia. rectal bleeding. BUN mildly elevated as is Creatinine. This is presumably a lower GI bleed. Scant amount of dark red stool on DRE. Marland Kitchen This is most likely a lower GI bleed. Doesn't seem to be tender. WBC is normal. Diverticular hemorrhage high on DDx list.  Ischemic colitis not excluded. Neoplasm also possible but seems less likely.  -Ideally patient would undergo a colonoscopy for further workup. She has dementia so I will need to call daughter or POA and discuss. No active bleeding at present and  Surprisingly hgb was had only a minimal drop from 11.1 in July to 10.6.  -Clear liquids for now.   2. CKD 3, stable at baseline  3. Dementia. She answers very few questions with a head shake.  4. Chronic systolic and diastolic heart failure    HPI: Kaitlyn Simmons is a 71 y.o. female with dementia transferred from SNF for rectal bleeding.She takes Plavix and ASA.  Patient cannot provide any history. Patient has CKD 3, CAD / hx of STEMI / chronic systolic and diastolic CHF / ischemic cardiomyopathy. Baseline hgb ~ 11, it is 10.6 today. Per nurse in room, patient had a large BM with blood clots early this am. Nurse didn't see the blood herself, got it in report. Apparently the bloody BM was very malodorous.    Past Medical History:  Diagnosis Date  . CAD (coronary artery disease)    a. s/p [place] STEMI 7/11 => tx with BMS to RCA (tx at [facility]);  b.  AL STEMI 7/12 => LHC: mRCA 100% ==> PCI with BMS to RCA; LM 10%, pCFX 50%, OM1 10%, pLAD 50%, mLAD 99%, 70%  ==>  c. staged PCI: BMS to  LAD (tx at [facility])  . Cataracts, bilateral   . CHF (congestive heart failure) ([place])   . Chronic systolic heart failure ([place])   . Dementia   . GERD (gastroesophageal reflux disease)   .  HTN (hypertension)   . Hyperlipidemia   . Ischemic cardiomyopathy    a. EF 30-35% at time of AL STEMI 7/12;   b. IMPROVED by f/u Echo 9/13:  mild LVH, EF 45-50%, mid to dist Inf, AS, Apical HK, Gr 1 diast dysfn, MAC, mod LAE  . Poor short-term memory   . STEMI (ST elevation myocardial infarction) ([place]) 2011; 2012   [place]; [place]    Past Surgical History:  Procedure Laterality Date  . CATARACT EXTRACTION W/ INTRAOCULAR LENS IMPLANT Left ~ 2008  . CHOLECYSTECTOMY N/A 10/07/2012   Procedure: LAPAROSCOPIC CHOLECYSTECTOMY WITH INTRAOPERATIVE CHOLANGIOGRAM;  Surgeon: Shann Medal, MD;  Location: [facility];  Service: General;  Laterality: N/A;  . CORONARY ANGIOPLASTY WITH STENT PLACEMENT  2011; 2012   RCA; RCA & LAD  . DILATION AND CURETTAGE OF UTERUS  1970's  . ERCP N/A 10/08/2012   Procedure: ENDOSCOPIC RETROGRADE CHOLANGIOPANCREATOGRAPHY (ERCP);  Surgeon: Gatha Mayer, MD;  Location: [facility];  Service: Endoscopy;  Laterality: N/A;  . ERCP W/ SPHICTEROTOMY  11/10/2012   stones removed [place]  . TONSILLECTOMY AND ADENOIDECTOMY  1980's    Prior to Admission medications   Medication Sig Start Date End Date Taking? Authorizing Provider  acetaminophen (TYLENOL) 500 MG tablet Take 500 mg by mouth every 4 (four) hours as needed for moderate pain  or headache.   Yes [provider]  alum & mag hydroxide-simeth ([place]) 200-200-20 MG/5ML suspension Take 30 mLs by mouth every 6 (six) hours as needed for indigestion or heartburn.   Yes [provider]  aspirin EC 81 MG tablet Take 1 tablet (81 mg total) by mouth as needed. Patient taking differently: Take 81 mg by mouth daily.  06/27/15  Yes Carrie Mew, MD  atorvastatin (LIPITOR) 80 MG tablet Take 1 tablet (80 mg total) by mouth at bedtime. 06/27/15  Yes Carrie Mew, MD  clopidogrel (PLAVIX) 75 MG tablet Take 1 tablet (75 mg total) by mouth daily. 06/27/15  Yes Carrie Mew, MD  FLUoxetine (PROZAC) 10 MG capsule Take 40 mg by  mouth daily.    Yes [provider]  furosemide (LASIX) 40 MG tablet Take 1 tablet (40 mg total) by mouth every morning. 06/27/15  Yes Carrie Mew, MD  guaifenesin (ROBITUSSIN) 100 MG/5ML syrup Take 200 mg by mouth every 6 (six) hours as needed for cough.    Yes [provider]  lisinopril (ZESTRIL) 2.5 MG tablet Take 1 tablet (2.5 mg total) by mouth daily. 06/27/15  Yes Carrie Mew, MD  loperamide (IMODIUM) 2 MG capsule Take 2 mg by mouth daily as needed for diarrhea or loose stools.   Yes [provider]  magnesium hydroxide (MILK OF MAGNESIA) 400 MG/5ML suspension Take 30 mLs by mouth at bedtime as needed for moderate constipation.    Yes [provider]  Melatonin 5 MG TABS Take 5 mg by mouth at bedtime.   Yes [provider]  memantine (NAMENDA) 5 MG tablet Take 5 mg by mouth 2 (two) times daily.   Yes [provider]  Multiple Vitamins-Minerals (OCUVITE ADULT 50+) CAPS Take 1 capsule by mouth every morning.   Yes [provider]  Neomycin-Bacitracin-Polymyxin (TRIPLE ANTIBIOTIC) [phone] OINT Apply 1 application topically as needed (wound care).   Yes [provider]  nitroGLYCERIN (NITROSTAT) 0.4 MG SL tablet Place 1 tablet (0.4 mg total) under the tongue every 5 (five) minutes as needed for chest pain. 06/27/15  Yes Carrie Mew, MD  risperiDONE (RISPERDAL) 1 MG tablet Take 1 mg by mouth at bedtime.   Yes [provider]  rivastigmine (EXELON) 1.5 MG capsule Take 1.5 mg by mouth 2 (two) times daily.   Yes [provider]    Current Facility-Administered Medications  Medication Dose Route Frequency Provider Last Rate Last Dose  . 0.9 %  sodium chloride infusion   Intravenous Continuous Tat, Shanon Brow, MD 50 mL/hr at 07/08/16 3710    . atorvastatin (LIPITOR) tablet 80 mg  80 mg Oral QHS Danford, Suann Larry, MD      . FLUoxetine (PROZAC) capsule 40 mg  40 mg Oral Daily Danford, Christopher  P, MD      . furosemide (LASIX) tablet 40 mg  40 mg Oral q morning - 10a Danford, Suann Larry, MD      . memantine (NAMENDA) tablet 5 mg  5 mg Oral BID Danford, Christopher P, MD      . risperiDONE (RISPERDAL) tablet 1 mg  1 mg Oral QHS Danford, Christopher P, MD      . rivastigmine (EXELON) capsule 1.5 mg  1.5 mg Oral BID Edwin Dada, MD        Allergies as of 07/08/2016  . (No Known Allergies)    Family History  Problem Relation Age of Onset  . Heart attack Father  DECEASED  . Stroke Father        DECEASED  . Leukemia Mother        DECEASED  . Heart attack Sister        ALIVE  . Hypertension Sister     Social History   Social History  . Marital status: Single    Spouse name: N/A  . Number of children: N/A  . Years of education: N/A   Occupational History  . Not on file.   Social History Main Topics  . Smoking status: Former Smoker    Packs/day: 2.00    Years: 30.00    Types: Cigarettes    Quit date: 10/25/2002  . Smokeless tobacco: Never Used  . Alcohol use No  . Drug use: No  . Sexual activity: No   Other Topics Concern  . Not on file   Social History Narrative  . No narrative on file    Review of Systems: All systems reviewed and negative except where noted in HPI.  Physical Exam: Vital signs in last 24 hours: Temp:  [97.9 F (36.6 C)-98.6 F (37 C)] 98.6 F (37 C) (05/13 0842) Pulse Rate:  [61-83] 64 (05/13 0842) Resp:  [11-27] 16 (05/13 0842) BP: (100-136)/(40-90) 106/48 (05/13 0842) SpO2:  [96 %-100 %] 97 % (05/13 0842) Weight:  [206 lb 12.7 oz (93.8 kg)] 206 lb 12.7 oz (93.8 kg) (05/13 0440) Last BM Date: 07/08/16 General:   Alert, obese white female in NAD Psych:  Cooperative but doesn't answer most questions. Maintains eye contact. . Eyes:  Pupils equal, sclera clear, no icterus.   Conjunctiva pink. Ears:  Normal auditory acuity. Nose:  No deformity, discharge,  or lesions. Neck:  Supple; no masses Lungs:  Normal  respiratory effort. No obvious wheezing. Limited exam due to lack of patient's participation. Marland Kitchen  Heart:  Regular rate and rhythm; no murmurs, no edema Abdomen:  Soft, non-distended, nontender, BS active, no palp mass    Rectal:  No external lesions. Scant amount of dark red stool on DRE  Msk:  Symmetrical without gross deformities. . Neurologic:  Alert, follows commands. Doesn't answer questions including where she is, daughter's name,  Skin:  Intact without significant lesions or rashes..   Intake/Output from previous day: 05/12 0701 - 05/13 0700 In: 2000 [I.V.:1000; IV Piggyback:1000] Out: 800  Intake/Output this shift: No intake/output data recorded.  Lab Results:  Recent Labs  07/08/16 0200 07/08/16 0213 07/08/16 0759  WBC 9.2  --  8.2  HGB 10.8* 10.9* 10.6*  HCT 33.7* 32.0* 33.0*  PLT 204  --  181   BMET  Recent Labs  07/08/16 0200 07/08/16 0213  NA 138 140  K 4.2 4.2  CL 107 106  CO2 23  --   GLUCOSE 91 89  BUN 31* 32*  CREATININE 1.17* 1.20*  CALCIUM 9.1  --    LFT  Recent Labs  07/08/16 0200  PROT 6.5  ALBUMIN 3.6  AST 19  ALT 13*  ALKPHOS 74  BILITOT 0.4    Studies/Results: No results found. Tye Savoy, NP-C @  07/08/2016, 10:20 AM Pager number [phone]  GI ATTENDING  History, laboratories, x-rays, remote endoscopy evaluation reviewed. Patient seen and examined. Agree with comprehensive consultation note as outlined above. Extremely complicated medical history. Patient sent from nursing home with blood per rectum and being changed. Maroonish stools here. Slight drop in hemoglobin from baseline. Hemodynamically stable. On Plavix. Patient cannot provide any meaningful history or participate in her  care plan. Currently Plavix on hold. Differential diagnosis for bleeding includes mild diverticular bleed, AVM, neoplasia, stercoral ulcer (no impaction found), or internal hemorrhoids. Supportive care at this point. Physician assisted trying to  contact the daughter regarding thoughts on how aggressive to be with her workup. Not sure she could tolerate colonoscopy prep. May be reasonable to consider flexible sigmoidoscopy. Will discuss with GI team coming on service tomorrow. Thanks  Docia Chuck. Geri Seminole., M.D. [facility] Division of Gastroenterology

## 2016-07-08 NOTE — Progress Notes (Addendum)
PROGRESS NOTE  Kaitlyn Simmons VPX:[id number] DOB: Sep 22, 1945 DOA: 07/08/2016 PCP: Arlis Porta., MD  Brief History:  71 year old female with a history of dementia, coronary artery disease s/p PCI last in 2012, CHF EF 45%, and CKD III who presents with rectal bleeding from SNF. The patient is unable to provide any history secondary to advanced dementia. Apparently, when staff at her facility went to change the patient for bed, they noticed that she had had bright red rectal bleeding sometime during the day or evening.  She had had no previous episodes and was otherwise normal to staff. Upon arrival to the emergency department, the patient was afebrile and hemodynamically stable saturating well on room air. Rectal exam in the emergency department showed gross blood.  After discussion with the patient's daughter, she stated that the patient is able to ambulate, dress, bathe and feed herself at baseline. She is normally pleasantly confused.     Assessment/Plan: Rectal bleeding -baseline Hgb ~11 -follow CBC but not high functioning is that his general just -Hgb stable -suspect diverticular bleed vs hemorrhoidal -consult GI -clear liquids for now -continue IVF  Chronic systolic and diastolic CHF/ischemic cardiomyopathy -History of STEMI in 2011 & 2012 -holding lisinopril in setting of soft BP -continue home dose furosemide -daily weights -11/23/2011 echo EF 40-45%, grade 1 DD, moderate HK mid-distal inferior myocardium  Dementia -Continue Namenda, Risperdal, Exelon -continue fluoxetine -discussed with daughter--at baseline-pt is pleasantly confused but able to ambulate, dress, bathe and feed herself  Hypertension -Holding lisinopril in the setting of self blood pressure  Hyperlipidemia -Continue statin  CKD 3 -baseline creatinine 1.0-1.3 -stable  NCNC Anemia -baseline Hgb ~11 -follow CBC    Disposition Plan:   Stephan Minister  in 1-2 days  Family  Communication:   Family at bedside--Total time spent 35 minutes.  Greater than 50% spent face to face counseling and coordinating care. 0840 to 0915   Consultants:  [facility]  Code Status:  FULL  DVT Prophylaxis:  SCDs  Procedures: As Listed in Progress Note Above  Antibiotics: None    Subjective: Patient is awake and alert, but does not answer questions or follow commands. No reports of vomiting, diarrhea, hematochezia.   Objective: Vitals:   07/08/16 0330 07/08/16 0440 07/08/16 0623 07/08/16 0842  BP: 126/64  131/90 (!) 106/48  Pulse: 62  65 64  Resp: (!) 27  18 16   Temp:   97.9 F (36.6 C) 98.6 F (37 C)  TempSrc:   Oral Axillary  SpO2: 96%  99% 97%  Weight:  93.8 kg (206 lb 12.7 oz)      Intake/Output Summary (Last 24 hours) at 07/08/16 0855 Last data filed at 07/08/16 0742  Gross per 24 hour  Intake             2000 ml  Output              800 ml  Net             1200 ml   Weight change:  Exam:   General:  Pt is alert, does not follow commands appropriately, not in acute distress  HEENT: No icterus, No thrush, No neck mass, [place]/AT  Cardiovascular: RRR, S1/S2, no rubs, no gallops  Respiratory: CTA bilaterally, no wheezing, no crackles, no rhonchi; poor inspiratory effort  Abdomen: Soft/+BS, non tender, non distended, no guarding  Extremities: 1 + LE edema, No lymphangitis, No petechiae, No  rashes, no synovitis   Data Reviewed: I have personally reviewed following labs and imaging studies Basic Metabolic Panel:  Recent Labs Lab 07/08/16 0200 07/08/16 0213  NA 138 140  K 4.2 4.2  CL 107 106  CO2 23  --   GLUCOSE 91 89  BUN 31* 32*  CREATININE 1.17* 1.20*  CALCIUM 9.1  --    Liver Function Tests:  Recent Labs Lab 07/08/16 0200  AST 19  ALT 13*  ALKPHOS 74  BILITOT 0.4  PROT 6.5  ALBUMIN 3.6   No results for input(s): LIPASE, AMYLASE in the last 168 hours. No results for input(s): AMMONIA in the last 168 hours. Coagulation  Profile: No results for input(s): INR, PROTIME in the last 168 hours. CBC:  Recent Labs Lab 07/08/16 0200 07/08/16 0213 07/08/16 0759  WBC 9.2  --  8.2  NEUTROABS 5.6  --   --   HGB 10.8* 10.9* 10.6*  HCT 33.7* 32.0* 33.0*  MCV 90.1  --  89.4  PLT 204  --  181   Cardiac Enzymes: No results for input(s): CKTOTAL, CKMB, CKMBINDEX, TROPONINI in the last 168 hours. BNP: Invalid input(s): POCBNP CBG: No results for input(s): GLUCAP in the last 168 hours. HbA1C: No results for input(s): HGBA1C in the last 72 hours. Urine analysis:    Component Value Date/Time   COLORURINE YELLOW 09/14/2015 1404   APPEARANCEUR CLEAR 09/14/2015 1404   LABSPEC 1.014 09/14/2015 1404   PHURINE 5.0 09/14/2015 1404   GLUCOSEU NEGATIVE 09/14/2015 1404   HGBUR NEGATIVE 09/14/2015 1404   BILIRUBINUR NEGATIVE 09/14/2015 1404   KETONESUR NEGATIVE 09/14/2015 1404   PROTEINUR NEGATIVE 09/14/2015 1404   NITRITE NEGATIVE 09/14/2015 1404   LEUKOCYTESUR NEGATIVE 09/14/2015 1404   Sepsis Labs: @LABRCNTIP (procalcitonin:4,lacticidven:4) )No results found for this or any previous visit (from the past 240 hour(s)).   Scheduled Meds: . atorvastatin  80 mg Oral QHS  . FLUoxetine  40 mg Oral Daily  . furosemide  40 mg Oral q morning - 10a  . memantine  5 mg Oral BID  . risperiDONE  1 mg Oral QHS  . rivastigmine  1.5 mg Oral BID   Continuous Infusions: . sodium chloride 75 mL/hr at 07/08/16 0457    Procedures/Studies: No results found.  Keyontay Stolz, DO  [facility] Pager [phone]  If 7PM-7AM, please contact night-coverage [email or web address] Password [email or web address] 07/08/2016, 8:55 AM   LOS: 0 days

## 2016-07-09 ENCOUNTER — Encounter (HOSPITAL_COMMUNITY): Payer: Self-pay | Admitting: Physician Assistant

## 2016-07-09 DIAGNOSIS — K921 Melena: Secondary | ICD-10-CM

## 2016-07-09 DIAGNOSIS — D62 Acute posthemorrhagic anemia: Secondary | ICD-10-CM | POA: Diagnosis not present

## 2016-07-09 DIAGNOSIS — K625 Hemorrhage of anus and rectum: Secondary | ICD-10-CM | POA: Diagnosis not present

## 2016-07-09 DIAGNOSIS — R69 Illness, unspecified: Secondary | ICD-10-CM | POA: Diagnosis not present

## 2016-07-09 DIAGNOSIS — K922 Gastrointestinal hemorrhage, unspecified: Secondary | ICD-10-CM

## 2016-07-09 DIAGNOSIS — N183 Chronic kidney disease, stage 3 (moderate): Secondary | ICD-10-CM | POA: Diagnosis not present

## 2016-07-09 DIAGNOSIS — G301 Alzheimer's disease with late onset: Secondary | ICD-10-CM | POA: Diagnosis not present

## 2016-07-09 DIAGNOSIS — I5042 Chronic combined systolic (congestive) and diastolic (congestive) heart failure: Secondary | ICD-10-CM | POA: Diagnosis not present

## 2016-07-09 DIAGNOSIS — I2583 Coronary atherosclerosis due to lipid rich plaque: Secondary | ICD-10-CM | POA: Diagnosis not present

## 2016-07-09 DIAGNOSIS — F0281 Dementia in other diseases classified elsewhere with behavioral disturbance: Secondary | ICD-10-CM | POA: Diagnosis not present

## 2016-07-09 DIAGNOSIS — I2510 Atherosclerotic heart disease of native coronary artery without angina pectoris: Secondary | ICD-10-CM | POA: Diagnosis not present

## 2016-07-09 LAB — BASIC METABOLIC PANEL
Anion gap: 11 (ref 5–15)
BUN: 22 mg/dL — AB (ref 6–20)
CALCIUM: 9.2 mg/dL (ref 8.9–10.3)
CHLORIDE: 104 mmol/L (ref 101–111)
CO2: 23 mmol/L (ref 22–32)
CREATININE: 1.09 mg/dL — AB (ref 0.44–1.00)
GFR calc non Af Amer: 49 mL/min — ABNORMAL LOW (ref >60)
GFR, EST AFRICAN AMERICAN: 57 mL/min — AB (ref >60)
Glucose, Bld: 120 mg/dL — ABNORMAL HIGH (ref 65–99)
Potassium: 3.8 mmol/L (ref 3.5–5.1)
SODIUM: 138 mmol/L (ref 135–145)

## 2016-07-09 LAB — CBC
HEMATOCRIT: 35.2 % — AB (ref 36.0–46.0)
Hemoglobin: 11.3 g/dL — ABNORMAL LOW (ref 12.0–15.0)
MCH: 28.7 pg (ref 26.0–34.0)
MCHC: 32.1 g/dL (ref 30.0–36.0)
MCV: 89.3 fL (ref 78.0–100.0)
Platelets: 218 10*3/uL (ref 150–400)
RBC: 3.94 MIL/uL (ref 3.87–5.11)
RDW: 14.7 % (ref 11.5–15.5)
WBC: 10.3 10*3/uL (ref 4.0–10.5)

## 2016-07-09 LAB — MAGNESIUM: Magnesium: 1.9 mg/dL (ref 1.7–2.4)

## 2016-07-09 NOTE — Care Management Obs Status (Signed)
[facility] NOTIFICATION   Patient Details  Name: Kaitlyn Simmons MRN: [id number] Date of Birth: [birth year]   Medicare Observation Status Notification Given:  Yes    Guadalupe Maple, RN 07/09/2016, 1:49 PM

## 2016-07-09 NOTE — Progress Notes (Signed)
Progress Note   Subjective  Chief Complaint: Rectal Bleeding  Pt found asleep in bed this morning, she does not awaken during time of my exam/interview with patient's daughter who provides history at bedside this morning. Daughter believes prep would be hard for pt if she needs procedure due to dementia and has concerns regarding anesthesia with mother's know cardiac history. Believes she stopped passing blood in stool yesterday.  Nursing staff unavailable at time of my interview.   Objective   Vital signs in last 24 hours: Temp:  [97.8 F (36.6 C)-98.5 F (36.9 C)] 97.8 F (36.6 C) (05/14 0644) Pulse Rate:  [57-72] 57 (05/14 0644) Resp:  [16] 16 (05/14 0644) BP: (95-117)/(44-84) 95/44 (05/14 0644) SpO2:  [92 %-95 %] 92 % (05/14 0644) Last BM Date: 07/08/16 General: Elderly Caucasian female in NAD Heart:  Regular rate and rhythm; no murmurs Lungs: Respirations even and unlabored, lungs CTA bilaterally Abdomen:  Soft, nontender and nondistended. Normal bowel sounds. Extremities:  Without edema. Neurologic:  Asleep Psych:  Asleep  Lab Results:  Recent Labs  07/08/16 0200 07/08/16 0213 07/08/16 0759 07/09/16 0427  WBC 9.2  --  8.2 10.3  HGB 10.8* 10.9* 10.6* 11.3*  HCT 33.7* 32.0* 33.0* 35.2*  PLT 204  --  181 218   BMET  Recent Labs  07/08/16 0200 07/08/16 0213 07/09/16 0427  NA 138 140 138  K 4.2 4.2 3.8  CL 107 106 104  CO2 23  --  23  GLUCOSE 91 89 120*  BUN 31* 32* 22*  CREATININE 1.17* 1.20* 1.09*  CALCIUM 9.1  --  9.2   LFT  Recent Labs  07/08/16 0200  PROT 6.5  ALBUMIN 3.6  AST 19  ALT 13*  ALKPHOS 74  BILITOT 0.4    Assessment / Plan:   Assessment: 1. Rectal Bleeding: admitted from SNF, thought lower GI Bleed with scant amount of dark red stool at time of DRE 07/08/16, continued with some dark stool per nursing notes, non-tender-consider diverticular hemorrhage vs ischemic colitis vs other 2. CKD 3 3. Dementia 4. Chronic systolic  and diastolic heart failure   Plan: 1. Discussed that probability of procedures is low in this patient with complicated medical history and dementia. Daughter agrees that prep would be hard and has concerns regarding anesthesia. She would like to avoid procedures as well as long as bleeding slows/stops, but will consent if necessary. Did discuss possible sigmoidoscopy if needed vs colonoscopy. 2. Continue to monitor patient including hgb.  3. Continue supportive measures and clear liquids for now. 4. Will discuss with Dr. Ardis Hughs, please await any further recommendations.  Thank you for your kind consultation, we will continue to follow.   LOS: 0 days   Levin Erp  07/09/2016, 9:25 AM  Pager # [phone]   ________________________________________________________________________  [facility] MD note:  I personally examined the patient, reviewed the data and agree with the assessment and plan described above.  Seems like bleeding ha stopped. Given her overall poor health, dementia it is certainly reasonable to observe her clinically for now.  Perhaps she does not need to restart BOTH ASA and plavix?  Being on a single antiplatelet agent may decrease future risk of bleeding.  I'll leave that to hospitalist to decide. Would not start either of those back for another 4-5 days.  If time proves that she continues to bleed despite the above, her daughter understands that colonoscopy/flex sig may be worth the trouble/risk.   Quillian Quince  Ardis Hughs, MD [facility] Pager [phone]

## 2016-07-09 NOTE — Discharge Summary (Addendum)
Physician Discharge Summary  Kaitlyn Simmons:[id number] DOB: Aug 25, 1945 DOA: 07/08/2016  PCP: Arlis Porta., MD  Admit date: 07/08/2016 Discharge date: 07/10/16  Admitted From: Kaitlyn Simmons Disposition:  Kaitlyn Simmons  Recommendations for Outpatient Follow-up:  1. Follow up with PCP in 1-2 weeks 2. Please obtain CBC in one week   Home Health: Equipment/Devices:  Discharge Condition: Stable CODE STATUS: FULL Diet recommendation: Soft   Brief/Interim Summary: 71 year old female with a history of dementia, coronary artery disease s/p PCI last in 2012, CHF EF 45%, and CKD IIIwho presents with rectal bleeding from SNF.The patient is unable to provide any history secondary to advanced dementia. Apparently, when staff at her facility went tochange the patient for bed, they noticed that she had had bright red rectal bleeding sometime during the day or evening. She had had no previous episodes and was otherwise normal to staff. Upon arrival to the emergency department, the patient was afebrile and hemodynamically stable saturating well on room air. Rectal exam in the emergency department showed gross blood. After discussion with the patient's daughter, she stated that the patient is able to ambulate, dress, bathe and feed herself at baseline. She is normally pleasantly confused.  Discharge Diagnoses:  Rectal bleeding -baseline Hgb ~11 -Hgb stable--Hgb 11.0 on day of d/c -suspect diverticular bleed although cannot r/o AVM, neoplasm -GI follow up reviewed-->restart plavix 07/14/16; she has been off ASA for >1 yr -I spoke with daughter and recommended that pt follow up with cardiology after d/c to determine if there needs to be any change in her antiplatelet regimen--previously saw Dr. Acie Fredrickson -bleeding resolved within 24 hours of admission -GI favored observation and expectant management; reconsider colonoscopy if rebleeds -advanced diet to soft -continue IVF-->saline  lock  Chronic systolic and diastolic CHF/ischemic cardiomyopathy -History of STEMI in 2011 & 2012 with cardiogenic shock in July 2012 -BMS to mid RCA in 2011; BMS to mid LAD in July 2012 -holding lisinopril in setting of soft BP -continue home dose furosemide -daily weights -11/23/2011 echo EF 40-45%, grade 1 DD, moderate HK mid-distal inferior myocardium  Dementia -Continue Namenda, Risperdal, Exelon -continue fluoxetine -discussed with daughter--at baseline-pt is pleasantly confused but able to ambulate, dress, bathe and feed herself  Hypertension -Holding lisinopril in the setting of soft blood pressure--restart after d/c as BP improved  Hyperlipidemia -Continue statin  CKD 3 -baseline creatinine 1.0-1.3 -stable  NCNC Anemia -baseline Hgb ~11 -follow CBC    Discharge Instructions  Discharge Instructions    Diet - low sodium heart healthy    Complete by:  As directed    Increase activity slowly    Complete by:  As directed      Allergies as of 07/10/2016   No Known Allergies     Medication List    STOP taking these medications   aspirin EC 81 MG tablet   loperamide 2 MG capsule Commonly known as:  IMODIUM     TAKE these medications   acetaminophen 500 MG tablet Commonly known as:  TYLENOL Take 500 mg by mouth every 4 (four) hours as needed for moderate pain or headache.   atorvastatin 80 MG tablet Commonly known as:  LIPITOR Take 1 tablet (80 mg total) by mouth at bedtime.   clopidogrel 75 MG tablet Commonly known as:  PLAVIX Take 1 tablet (75 mg total) by mouth daily. Start taking on:  07/14/2016 What changed:  These instructions start on 07/14/2016. If you are unsure what to do until then, ask your  doctor or other care provider.   FLUoxetine 10 MG capsule Commonly known as:  PROZAC Take 40 mg by mouth daily.   furosemide 40 MG tablet Commonly known as:  LASIX Take 1 tablet (40 mg total) by mouth every morning.   guaifenesin 100  MG/5ML syrup Commonly known as:  ROBITUSSIN Take 200 mg by mouth every 6 (six) hours as needed for cough.   lisinopril 2.5 MG tablet Commonly known as:  ZESTRIL Take 1 tablet (2.5 mg total) by mouth daily.   magnesium hydroxide 400 MG/5ML suspension Commonly known as:  MILK OF MAGNESIA Take 30 mLs by mouth at bedtime as needed for moderate constipation.   Melatonin 5 MG Tabs Take 5 mg by mouth at bedtime.   memantine 5 MG tablet Commonly known as:  NAMENDA Take 5 mg by mouth 2 (two) times daily.   [place] 200-200-20 MG/5ML suspension Generic drug:  alum & mag hydroxide-simeth Take 30 mLs by mouth every 6 (six) hours as needed for indigestion or heartburn.   nitroGLYCERIN 0.4 MG SL tablet Commonly known as:  NITROSTAT Place 1 tablet (0.4 mg total) under the tongue every 5 (five) minutes as needed for chest pain.   OCUVITE ADULT 50+ Caps Take 1 capsule by mouth every morning.   risperiDONE 1 MG tablet Commonly known as:  RISPERDAL Take 1 mg by mouth at bedtime.   rivastigmine 1.5 MG capsule Commonly known as:  EXELON Take 1.5 mg by mouth 2 (two) times daily.   TRIPLE ANTIBIOTIC 3.5-281 059 [id number] Oint Apply 1 application topically as needed (wound care).       No Known Allergies  Consultations:  [facility]   Procedures/Studies: No results found.      Discharge Exam: Vitals:   07/09/16 2107 07/10/16 1124  BP: (!) 90/52 115/69  Pulse: 65 61  Resp: 16 16  Temp: 99.4 F (37.4 C)    Vitals:   07/09/16 0644 07/09/16 1435 07/09/16 2107 07/10/16 1124  BP: (!) 95/44 117/72 (!) 90/52 115/69  Pulse: (!) 57 71 65 61  Resp: 16 16 16 16   Temp: 97.8 F (36.6 C) 98.8 F (37.1 C) 99.4 F (37.4 C)   TempSrc: Axillary Oral Oral   SpO2: 92% 95% 96% 96%  Weight:        General: Pt is alert, awake, not in acute distress Cardiovascular: RRR, S1/S2 +, no rubs, no gallops Respiratory: CTA bilaterally, no wheezing, no rhonchi Abdominal: Soft, NT, ND, bowel sounds  + Extremities: no edema, no cyanosis   The results of significant diagnostics from this hospitalization (including imaging, microbiology, ancillary and laboratory) are listed below for reference.    Significant Diagnostic Studies: No results found.   Microbiology: Recent Results (from the past 240 hour(s))  MRSA PCR Screening     Status: None   Collection Time: 07/08/16  8:38 PM  Result Value Ref Range Status   MRSA by PCR NEGATIVE NEGATIVE Final    Comment:        The GeneXpert MRSA Assay (FDA approved for NASAL specimens only), is one component of a comprehensive MRSA colonization surveillance program. It is not intended to diagnose MRSA infection nor to guide or monitor treatment for MRSA infections.      Labs: Basic Metabolic Panel:  Recent Labs Lab 07/08/16 0200 07/08/16 0213 07/09/16 0427  NA 138 140 138  K 4.2 4.2 3.8  CL 107 106 104  CO2 23  --  23  GLUCOSE 91 89 120*  BUN 31*  32* 22*  CREATININE 1.17* 1.20* 1.09*  CALCIUM 9.1  --  9.2  MG  --   --  1.9   Liver Function Tests:  Recent Labs Lab 07/08/16 0200  AST 19  ALT 13*  ALKPHOS 74  BILITOT 0.4  PROT 6.5  ALBUMIN 3.6   No results for input(s): LIPASE, AMYLASE in the last 168 hours. No results for input(s): AMMONIA in the last 168 hours. CBC:  Recent Labs Lab 07/08/16 0200 07/08/16 0213 07/08/16 0759 07/09/16 0427 07/10/16 0429  WBC 9.2  --  8.2 10.3 9.0  NEUTROABS 5.6  --   --   --   --   HGB 10.8* 10.9* 10.6* 11.3* 11.0*  HCT 33.7* 32.0* 33.0* 35.2* 34.5*  MCV 90.1  --  89.4 89.3 89.8  PLT 204  --  181 218 212   Cardiac Enzymes: No results for input(s): CKTOTAL, CKMB, CKMBINDEX, TROPONINI in the last 168 hours. BNP: Invalid input(s): POCBNP CBG: No results for input(s): GLUCAP in the last 168 hours.  Time coordinating discharge:  Greater than 30 minutes  Signed:  Tristian Sickinger, DO [facility] Pager: [phone] 07/10/2016, 11:40 AM

## 2016-07-09 NOTE — Progress Notes (Signed)
Pt is from [place]. CSW will follow to assist with d/c planning back to ALF unless at higher level of care is required.  Werner Lean LCSW [phone]

## 2016-07-09 NOTE — Progress Notes (Addendum)
Plan for return to ALF, discharge planning per CSW. [phone]

## 2016-07-09 NOTE — Progress Notes (Signed)
PROGRESS NOTE  Kaitlyn Simmons XKG:[id number] DOB: [birth year] DOA: 07/08/2016 PCP: Arlis Porta., MD  Brief History:  71 year old female with a history of dementia, coronary artery disease s/p PCI last in 2012, CHF EF 45%, and CKD IIIwho presents with rectal bleeding from SNF. The patient is unable to provide any history secondary to advanced dementia. Apparently, when staff at her facility went tochange the patient for bed, they noticed that she had had bright red rectal bleeding sometime during the day or evening. She had had no previous episodes and was otherwise normal to staff. Upon arrival to the emergency department, the patient was afebrile and hemodynamically stable saturating well on room air. Rectal exam in the emergency department showed gross blood.  After discussion with the patient's daughter, she stated that the patient is able to ambulate, dress, bathe and feed herself at baseline. She is normally pleasantly confused.     Assessment/Plan: Rectal bleeding -baseline Hgb ~11 -Hgb stable -suspect diverticular bleed although cannot r/o AVM, neoplasm - GI follow up reviewed-->restart plavix5 07/14/16; she has been off ASA for >1 yr -I spoke with daughter and recommended that pt follow up with cardiology after d/c to determine if there needs to be any change in her antiplatelet regimen -advance diet -continue IVF  Chronic systolic and diastolic CHF/ischemic cardiomyopathy -History of STEMI in 2011 & 2012 with cardiogenic shock in July 2017 -BMS to mid RCA in 2011; BMS to mid LAD in July 2012 -holding lisinopril in setting of soft BP -continue home dose furosemide -daily weights -11/23/2011 echo EF 40-45%, grade 1 DD, moderate HK mid-distal inferior myocardium  Dementia -Continue Namenda, Risperdal, Exelon -continue fluoxetine -discussed with daughter--at baseline-pt is pleasantly confused but able to ambulate, dress, bathe and feed  herself  Hypertension -Holding lisinopril in the setting of self blood pressure  Hyperlipidemia -Continue statin  CKD 3 -baseline creatinine 1.0-1.3 -stable  NCNC Anemia -baseline Hgb ~11 -follow CBC    Disposition Plan:   Stephan Minister  07/10/16 if stable Family Communication:   Daughter updated on phone 5/14   Consultants:  [facility]  Code Status:  FULL  DVT Prophylaxis:  SCDs  Procedures: As Listed in Progress Note Above  Antibiotics: None    Subjective: Patient is pleasant and confused. She denies any headache, chest pain, shortness breath, abdominal pain. She is unable to tell me if she is having hematochezia. However, nursing staff has not reported any.  Objective: Vitals:   07/08/16 0842 07/08/16 1340 07/08/16 2034 07/09/16 0644  BP: (!) 106/48 103/84 117/66 (!) 95/44  Pulse: 64 72 68 (!) 57  Resp: 16 16 16 16   Temp: 98.6 F (37 C) 98.5 F (36.9 C) 98.5 F (36.9 C) 97.8 F (36.6 C)  TempSrc: Axillary Oral Oral Axillary  SpO2: 97% 95% 95% 92%  Weight:        Intake/Output Summary (Last 24 hours) at 07/09/16 1343 Last data filed at 07/09/16 1200  Gross per 24 hour  Intake           1923.9 ml  Output              400 ml  Net           1523.9 ml   Weight change:  Exam:   General:  Pt is alert, follows commands appropriately, not in acute distress  HEENT: No icterus, No thrush, No neck mass, [place]/AT  Cardiovascular: RRR, S1/S2,  no rubs, no gallops  Respiratory: CTA bilaterally, no wheezing, no crackles, no rhonchi  Abdomen: Soft/+BS, non tender, non distended, no guarding  Extremities: trace LE edema, No lymphangitis, No petechiae, No rashes, no synovitis   Data Reviewed: I have personally reviewed following labs and imaging studies Basic Metabolic Panel:  Recent Labs Lab 07/08/16 0200 07/08/16 0213 07/09/16 0427  NA 138 140 138  K 4.2 4.2 3.8  CL 107 106 104  CO2 23  --  23  GLUCOSE 91 89 120*  BUN 31* 32*  22*  CREATININE 1.17* 1.20* 1.09*  CALCIUM 9.1  --  9.2  MG  --   --  1.9   Liver Function Tests:  Recent Labs Lab 07/08/16 0200  AST 19  ALT 13*  ALKPHOS 74  BILITOT 0.4  PROT 6.5  ALBUMIN 3.6   No results for input(s): LIPASE, AMYLASE in the last 168 hours. No results for input(s): AMMONIA in the last 168 hours. Coagulation Profile: No results for input(s): INR, PROTIME in the last 168 hours. CBC:  Recent Labs Lab 07/08/16 0200 07/08/16 0213 07/08/16 0759 07/09/16 0427  WBC 9.2  --  8.2 10.3  NEUTROABS 5.6  --   --   --   HGB 10.8* 10.9* 10.6* 11.3*  HCT 33.7* 32.0* 33.0* 35.2*  MCV 90.1  --  89.4 89.3  PLT 204  --  181 218   Cardiac Enzymes: No results for input(s): CKTOTAL, CKMB, CKMBINDEX, TROPONINI in the last 168 hours. BNP: Invalid input(s): POCBNP CBG: No results for input(s): GLUCAP in the last 168 hours. HbA1C: No results for input(s): HGBA1C in the last 72 hours. Urine analysis:    Component Value Date/Time   COLORURINE YELLOW 09/14/2015 1404   APPEARANCEUR CLEAR 09/14/2015 1404   LABSPEC 1.014 09/14/2015 1404   PHURINE 5.0 09/14/2015 1404   GLUCOSEU NEGATIVE 09/14/2015 1404   HGBUR NEGATIVE 09/14/2015 1404   BILIRUBINUR NEGATIVE 09/14/2015 1404   KETONESUR NEGATIVE 09/14/2015 1404   PROTEINUR NEGATIVE 09/14/2015 1404   NITRITE NEGATIVE 09/14/2015 1404   LEUKOCYTESUR NEGATIVE 09/14/2015 1404   Sepsis Labs: @LABRCNTIP (procalcitonin:4,lacticidven:4) ) Recent Results (from the past 240 hour(s))  MRSA PCR Screening     Status: None   Collection Time: 07/08/16  8:38 PM  Result Value Ref Range Status   MRSA by PCR NEGATIVE NEGATIVE Final    Comment:        The GeneXpert MRSA Assay (FDA approved for NASAL specimens only), is one component of a comprehensive MRSA colonization surveillance program. It is not intended to diagnose MRSA infection nor to guide or monitor treatment for MRSA infections.      Scheduled Meds: . atorvastatin   80 mg Oral QHS  . FLUoxetine  40 mg Oral Daily  . furosemide  40 mg Oral q morning - 10a  . memantine  5 mg Oral BID  . risperiDONE  1 mg Oral QHS  . rivastigmine  1.5 mg Oral BID   Continuous Infusions:  Procedures/Studies: No results found.  Mikaila Grunert, DO  [facility] Pager [phone]  If 7PM-7AM, please contact night-coverage [email or web address] Password [email or web address] 07/09/2016, 1:43 PM   LOS: 0 days

## 2016-07-09 NOTE — Plan of Care (Signed)
Problem: Bowel/Gastric: Goal: Will not experience complications related to bowel motility Outcome: Progressing Patient gets very restless when she needs to use the bathroom. Offer toileting and instruct on what to do

## 2016-07-09 NOTE — Plan of Care (Signed)
Problem: Skin Integrity: Goal: Risk for impaired skin integrity will decrease Outcome: Progressing Apply microguard powder to abdominal folds, groin, and gluteal folds twice daily until resolved

## 2016-07-09 NOTE — Clinical Social Work Note (Signed)
Clinical Social Work Assessment  Patient Details  Name: Kaitlyn Simmons MRN: [id number] Date of Birth: 12-13-1945  Date of referral:  07/09/16               Reason for consult:  Facility Placement, Discharge Planning                Permission sought to share information with:  Facility Art therapist granted to share information::  Yes, Verbal Permission Granted  Name::        Agency::     Relationship::     Contact Information:     Housing/Transportation Living arrangements for the past 2 months:  [place] of Information:  Adult Children, Facility Patient Interpreter Needed:  None Criminal Activity/Legal Involvement Pertinent to Current Situation/Hospitalization:  No - Comment as needed Significant Relationships:  Adult Children Lives with:  Facility Resident Do you feel safe going back to the place where you live?  Yes Need for family participation in patient care:  Yes (Comment)  Care giving concerns:  No concerns reported at this time.   Social Worker assessment / plan:  Pt from [facility] [place] [facility]F with lower GI bleed hospitalized under observation on 07/08/16. Pt is oriented to self only. CSW met with pt at bedside to offer support. CSW spoke with pt's daughter, Kaitlyn Simmons [phone], to assist with d/c planning. Daughter reports that she would like pt to return to [facility] at d/c. [facility] spoke with Kaitlyn Simmons from ALF and clinicals were sent for review. CSW will continue to follow to assist with d/c planning needs.  Employment status:  Retired Forensic scientist:  Managed Care PT Recommendations:  Not assessed at this time Information / Referral to community resources:     Patient/Family's Response to care:  Daughter is in agreement plan for pt to return to ALF at d/c.  Patient/Family's Understanding of and Emotional Response to Diagnosis, Current Treatment, and Prognosis:  Daughter spoke with MD this am for medical update.  She is looking forward to pt returning to ALF and appreciates CSW assistance with d/c planning.  Emotional Assessment Appearance:  Appears stated age Attitude/Demeanor/Rapport:  Other (cooperative) Affect (typically observed):  Calm, Pleasant Orientation:  Oriented to Self Alcohol / Substance use:  Not Applicable Psych involvement (Current and /or in the community):  No (Comment)  Discharge Needs  Concerns to be addressed:  Discharge Planning Concerns Readmission within the last 30 days:  No Current discharge risk:  None Barriers to Discharge:  No Barriers Identified   Kaitlyn Simmons  [phone] 07/09/2016, 3:38 PM

## 2016-07-09 NOTE — Progress Notes (Signed)
Patient remains very confused and agitated. Has poor safety awareness and was observed standing in the hallway with her eyes closed and swaying. No safety sitter available at this time. Patient was placed in a wheelchair and stayed with this RN until she was toileted and had a medium size black stool. Patient was placed in bed and has been since.

## 2016-07-09 NOTE — Evaluation (Signed)
Physical Therapy Evaluation Patient Details Name: Kaitlyn Simmons MRN: [id number] DOB: August 30, 1945 Today's Date: 07/09/2016   History of Present Illness  Kaitlyn Simmons is a 71 y.o. female with a past medical history significant for advanced dementia, CAD s/p PCI last in 2012, CHF EF 45%, and CKD III who presents with rectal bleeding from SNF.  Clinical Impression  Pt admitted with above diagnosis. Pt currently with functional limitations due to the deficits listed below (see PT Problem List).  Pt will benefit from skilled PT to increase their independence and safety with mobility to allow discharge to the venue listed below.   Pt requires multi-modal cues for mobility, she verbalizes very little/baseline dementia but is cooperative; will continue to follow and attempt to incr pt mobility prior to return to ALF     Follow Up Recommendations Home health PT (at ALF vs no f/u-?)    Equipment Recommendations  None recommended by PT    Recommendations for Other Services       Precautions / Restrictions Precautions Precautions: Fall      Mobility  Bed Mobility Overal bed mobility: Needs Assistance Bed Mobility: Supine to Sit     Supine to sit: Min assist     General bed mobility comments: assist/facilitation and multi-modal cues to complete EOB  Transfers Overall transfer level: Needs assistance Equipment used: None Transfers: Sit to/from Stand Sit to Stand: Min assist;Min guard         General transfer comment: facilitation and multi-modal cues to transition to standing, light assist for anterior-superior wt shift  Ambulation/Gait Ambulation/Gait assistance: Min assist Ambulation Distance (Feet): 5 Feet Assistive device: 1 person hand held assist       General Gait Details: cues for safety, pt reported slight dizziness, resolved per pt report but pt wanted to sit, therfore distance limited   Stairs            Wheelchair Mobility    Modified Rankin (Stroke  Patients Only)       Balance Overall balance assessment: Needs assistance   Sitting balance-Leahy Scale: Fair       Standing balance-Leahy Scale: Poor                               Pertinent Vitals/Pain Pain Assessment: Faces Faces Pain Scale: No hurt    Home Living Family/patient expects to be discharged to:: Assisted living                 Additional Comments: pt unable to give info; ambulatory per chart prior to adm    Prior Function                 Hand Dominance        Extremity/Trunk Assessment   Upper Extremity Assessment Upper Extremity Assessment: Overall WFL for tasks assessed    Lower Extremity Assessment Lower Extremity Assessment: Overall WFL for tasks assessed       Communication   Communication: Expressive difficulties  Cognition Arousal/Alertness: Awake/alert Behavior During Therapy: WFL for tasks assessed/performed Overall Cognitive Status: History of cognitive impairments - at baseline                                        General Comments      Exercises     Assessment/Plan    PT Assessment Patient  needs continued PT services  PT Problem List Decreased activity tolerance;Decreased balance;Decreased mobility       PT Treatment Interventions Gait training;Functional mobility training;Therapeutic activities    PT Goals (Current goals can be found in the Care Plan section)  Acute Rehab PT Goals Patient Stated Goal: none stated PT Goal Formulation: Patient unable to participate in goal setting Time For Goal Achievement: 07/16/16 Potential to Achieve Goals: Good    Frequency Min 3X/week   Barriers to discharge        Co-evaluation               AM-PAC PT "6 Clicks" Daily Activity  Outcome Measure Difficulty turning over in bed (including adjusting bedclothes, sheets and blankets)?: A Little Difficulty moving from lying on back to sitting on the side of the bed? : A  Little Difficulty sitting down on and standing up from a chair with arms (e.g., wheelchair, bedside commode, etc,.)?: A Little Help needed moving to and from a bed to chair (including a wheelchair)?: A Little Help needed walking in hospital room?: A Little Help needed climbing 3-5 steps with a railing? : A Lot 6 Click Score: 17    End of Session Equipment Utilized During Treatment: Gait belt Activity Tolerance: Patient tolerated treatment well Patient left: with call bell/phone within reach;in chair;with chair alarm set Nurse Communication: Mobility status PT Visit Diagnosis: Unsteadiness on feet (R26.81)    Time: 1518-3437 PT Time Calculation (min) (ACUTE ONLY): 19 min   Charges:   PT Evaluation $PT Eval Low Complexity: 1 Procedure     PT G Codes:   PT G-Codes **NOT FOR INPATIENT CLASS** Functional Assessment Tool Used: AM-PAC 6 Clicks Basic Mobility;Clinical judgement Functional Limitation: Mobility: Walking and moving around Mobility: Walking and Moving Around Current Status (D5789): At least 1 percent but less than 20 percent impaired, limited or restricted Mobility: Walking and Moving Around Goal Status [phone]): At least 1 percent but less than 20 percent impaired, limited or restricted    Kenyon Ana, PT Pager: [phone] 07/09/2016   [facility] 07/09/2016, 4:02 PM

## 2016-07-10 DIAGNOSIS — I2510 Atherosclerotic heart disease of native coronary artery without angina pectoris: Secondary | ICD-10-CM | POA: Diagnosis not present

## 2016-07-10 DIAGNOSIS — F0281 Dementia in other diseases classified elsewhere with behavioral disturbance: Secondary | ICD-10-CM | POA: Diagnosis not present

## 2016-07-10 DIAGNOSIS — R69 Illness, unspecified: Secondary | ICD-10-CM | POA: Diagnosis not present

## 2016-07-10 DIAGNOSIS — G301 Alzheimer's disease with late onset: Secondary | ICD-10-CM | POA: Diagnosis not present

## 2016-07-10 DIAGNOSIS — N183 Chronic kidney disease, stage 3 (moderate): Secondary | ICD-10-CM | POA: Diagnosis not present

## 2016-07-10 DIAGNOSIS — D62 Acute posthemorrhagic anemia: Secondary | ICD-10-CM | POA: Diagnosis not present

## 2016-07-10 DIAGNOSIS — I5042 Chronic combined systolic (congestive) and diastolic (congestive) heart failure: Secondary | ICD-10-CM | POA: Diagnosis not present

## 2016-07-10 DIAGNOSIS — K922 Gastrointestinal hemorrhage, unspecified: Secondary | ICD-10-CM | POA: Diagnosis not present

## 2016-07-10 DIAGNOSIS — K921 Melena: Secondary | ICD-10-CM | POA: Diagnosis not present

## 2016-07-10 DIAGNOSIS — I2583 Coronary atherosclerosis due to lipid rich plaque: Secondary | ICD-10-CM | POA: Diagnosis not present

## 2016-07-10 DIAGNOSIS — K625 Hemorrhage of anus and rectum: Secondary | ICD-10-CM | POA: Diagnosis not present

## 2016-07-10 LAB — CBC
HCT: 34.5 % — ABNORMAL LOW (ref 36.0–46.0)
Hemoglobin: 11 g/dL — ABNORMAL LOW (ref 12.0–15.0)
MCH: 28.6 pg (ref 26.0–34.0)
MCHC: 31.9 g/dL (ref 30.0–36.0)
MCV: 89.8 fL (ref 78.0–100.0)
PLATELETS: 212 10*3/uL (ref 150–400)
RBC: 3.84 MIL/uL — AB (ref 3.87–5.11)
RDW: 15.2 % (ref 11.5–15.5)
WBC: 9 10*3/uL (ref 4.0–10.5)

## 2016-07-10 MED ORDER — CLOPIDOGREL BISULFATE 75 MG PO TABS: 75.0000 mg | ORAL_TABLET | Freq: Every day | ORAL | 0 refills | Status: AC

## 2016-07-10 NOTE — Progress Notes (Signed)
Pt / daughter are in agreement with dc back to [facility] memory care unit today. Daughter requesting to  transport pt to facility. CSW attempted to fax through the hub the DC Summary / FL2 to facility to review but Tammy ( nsg ) has reported that info has not been received. CSW read med list to [facility] and she agreed to accept pt back with dc summary / FL2 included in d/c packet. Tammy will call CSW with any concerns for further assistance. DC packet provided to daughter. # for report provided to nsg.  Werner Lean LCSW [phone]

## 2016-07-10 NOTE — Progress Notes (Signed)
     [facility] Progress Note  Subjective:  Just got cleaned up.  Resting comfortable in bed.  No further bleeding per staff.  Hgb is stable.  Daughter not present during my visit today.  Objective:  Vital signs in last 24 hours: Temp:  [98.8 F (37.1 C)-99.4 F (37.4 C)] 99.4 F (37.4 C) (05/14 2107) Pulse Rate:  [65-71] 65 (05/14 2107) Resp:  [16] 16 (05/14 2107) BP: (90-117)/(52-72) 90/52 (05/14 2107) SpO2:  [95 %-96 %] 96 % (05/14 2107) Last BM Date: 07/08/16 General:  Alert, resting comfortable in bed, in NAD Heart:  Regular rate and rhythm; no murmurs Pulm:  CTAB.  No increased WOB. Abdomen:  Soft, non-distended.  BS present.  Non-tender.  Extremities:  Without edema.  Lab Results:  Recent Labs  07/08/16 0759 07/09/16 0427 07/10/16 0429  WBC 8.2 10.3 9.0  HGB 10.6* 11.3* 11.0*  HCT 33.0* 35.2* 34.5*  PLT 181 218 212   BMET  Recent Labs  07/08/16 0200 07/08/16 0213 07/09/16 0427  NA 138 140 138  K 4.2 4.2 3.8  CL 107 106 104  CO2 23  --  23  GLUCOSE 91 89 120*  BUN 31* 32* 22*  CREATININE 1.17* 1.20* 1.09*  CALCIUM 9.1  --  9.2   LFT  Recent Labs  07/08/16 0200  PROT 6.5  ALBUMIN 3.6  AST 19  ALT 13*  ALKPHOS 74  BILITOT 0.4   Assessment / Plan: 1. Rectal Bleeding:  Admitted from SNF, thought lower GI Bleed with scant amount of dark red stool at time of DRE 07/08/16, continued with some dark stool per nursing notes--consider diverticular bleed vs AVM vs other, cannot rule out neoplasm.  No further today bleeding per staff.  Hgb stable at 11.0 grams today.  Daughter decided no procedure unless bleeding continued and absolutely necessary. 2. CKD 3 3. Dementia 4. Chronic systolic and diastolic heart failure 5. Antiplatelet use with Plavix only--apparently has been off of ASA of >1 year.     LOS: 0 days   ZEHR, JESSICA D.  07/10/2016, 8:57 AM  Pager number  [phone]  ________________________________________________________________________  Velora Heckler GI MD note:  I personally examined the patient, reviewed the data and agree with the assessment and plan described above.  OK to d/c from GI perspective.  Consider not resuming plavix given her multiple comorbid conditions including pretty significant dementia.  Please call or page with any further questions or concerns.     Owens Loffler, MD [facility] Pager [phone]

## 2016-07-10 NOTE — NC FL2 (Signed)
[place] LEVEL OF CARE SCREENING TOOL     IDENTIFICATION  Patient Name: Kaitlyn Simmons Birthdate: 1945-12-30 Sex: female Admission Date (Current Location): 07/08/2016  [facility] and [place] Number:  Herbalist and Address:  [facility],  [place] [address], [place]      Provider Number: [id number]  Attending Physician Name and Address:  Orson Eva, MD  Relative Name and Phone Number:       Current Level of Care: Hospital Recommended Level of Care: Memory Care Unit Prior Approval Number:    Date Approved/Denied:   PASRR Number:    Discharge Plan: Memory care Unit   Current Diagnoses: Patient Active Problem List   Diagnosis Date Noted  . Coronary artery disease involving native coronary artery of native heart without angina pectoris   . Lower GI bleed 07/08/2016  . CKD (chronic kidney disease), stage III 07/08/2016  . Normocytic anemia 07/08/2016  . Chronic combined systolic and diastolic CHF (congestive heart failure) ([place]) 07/08/2016  . Rectal bleeding   . Hematochezia   . Acute blood loss anemia   . Alzheimer's dementia, late onset, with behavioral disturbance 06/21/2015  . Body mass index 40.0-44.9, adult ([place]) 12/17/2014  . Morbid obesity ([place]) 12/17/2014  . Depression 09/17/2014  . Calculus of bile duct without mention of cholecystitis or obstruction 10/08/2012  . Abnormal cholangiogram 10/08/2012  . Mucosal abnormality of duodenum 10/08/2012  . Coronary artery disease due to lipid rich plaque   . Chronic systolic CHF (congestive heart failure) ([place])   . Essential hypertension   . Hyperlipidemia   . Ischemic cardiomyopathy     Orientation RESPIRATION BLADDER Height & Weight     Self  Normal Incontinent Weight: 206 lb 12.7 oz (93.8 kg) Height:     BEHAVIORAL SYMPTOMS/MOOD NEUROLOGICAL BOWEL NUTRITION STATUS  Other (Comment) (No behaviors)   Continent Diet (Heart Healthy)  AMBULATORY STATUS COMMUNICATION OF  NEEDS Skin   Limited Assist Verbally Normal                       Personal Care Assistance Level of Assistance  Bathing, Feeding, Dressing Bathing Assistance: Limited assistance Feeding assistance: Independent Dressing Assistance: Limited assistance     Functional Limitations Info  Sight, Hearing, Speech Sight Info: Adequate Hearing Info: Adequate Speech Info: Adequate    SPECIAL CARE FACTORS FREQUENCY  PT (By licensed PT)     PT Frequency: 3x wk              Contractures Contractures Info: Not present    Additional Factors Info  Code Status Code Status Info: Full Code             Current Medications (07/10/2016):  This is the current hospital active medication list Current Facility-Administered Medications  Medication Dose Route Frequency Provider Last Rate Last Dose  . atorvastatin (LIPITOR) tablet 80 mg  80 mg Oral QHS Edwin Dada, MD   80 mg at 07/09/16 2200  . FLUoxetine (PROZAC) capsule 40 mg  40 mg Oral Daily Danford, Suann Larry, MD   40 mg at 07/10/16 1125  . furosemide (LASIX) tablet 40 mg  40 mg Oral q morning - 10a Danford, Suann Larry, MD   40 mg at 07/10/16 1126  . memantine (NAMENDA) tablet 5 mg  5 mg Oral BID Edwin Dada, MD   5 mg at 07/10/16 1126  . risperiDONE (RISPERDAL) tablet 1 mg  1 mg Oral QHS  Edwin Dada, MD   1 mg at 07/09/16 2200  . rivastigmine (EXELON) capsule 1.5 mg  1.5 mg Oral BID Edwin Dada, MD   1.5 mg at 07/10/16 1126     Discharge Medications: Please see discharge summary for a list of discharge medications.  Medication List    STOP taking these medications   aspirin EC 81 MG tablet   loperamide 2 MG capsule Commonly known as:  IMODIUM     TAKE these medications   acetaminophen 500 MG tablet Commonly known as:  TYLENOL Take 500 mg by mouth every 4 (four) hours as needed for moderate pain or headache.   atorvastatin 80 MG tablet Commonly known as:   LIPITOR Take 1 tablet (80 mg total) by mouth at bedtime.   clopidogrel 75 MG tablet Commonly known as:  PLAVIX Take 1 tablet (75 mg total) by mouth daily. Start taking on:  07/14/2016   FLUoxetine 10 MG capsule Commonly known as:  PROZAC Take 40 mg by mouth daily.   furosemide 40 MG tablet Commonly known as:  LASIX Take 1 tablet (40 mg total) by mouth every morning.   guaifenesin 100 MG/5ML syrup Commonly known as:  ROBITUSSIN Take 200 mg by mouth every 6 (six) hours as needed for cough.   lisinopril 2.5 MG tablet Commonly known as:  ZESTRIL Take 1 tablet (2.5 mg total) by mouth daily.   magnesium hydroxide 400 MG/5ML suspension Commonly known as:  MILK OF MAGNESIA Take 30 mLs by mouth at bedtime as needed for moderate constipation.   Melatonin 5 MG Tabs Take 5 mg by mouth at bedtime.   memantine 5 MG tablet Commonly known as:  NAMENDA Take 5 mg by mouth 2 (two) times daily.   [place] 200-200-20 MG/5ML suspension Generic drug:  alum & mag hydroxide-simeth Take 30 mLs by mouth every 6 (six) hours as needed for indigestion or heartburn.   nitroGLYCERIN 0.4 MG SL tablet Commonly known as:  NITROSTAT Place 1 tablet (0.4 mg total) under the tongue every 5 (five) minutes as needed for chest pain.   OCUVITE ADULT 50+ Caps Take 1 capsule by mouth every morning.   risperiDONE 1 MG tablet Commonly known as:  RISPERDAL Take 1 mg by mouth at bedtime.   rivastigmine 1.5 MG capsule Commonly known as:  EXELON Take 1.5 mg by mouth 2 (two) times daily.   TRIPLE ANTIBIOTIC [phone] Oint Apply 1 application topically as needed (wound care).       Relevant Imaging Results:  Relevant Lab Results:   Additional Information SSN:  [id number]. PT required at ALF.  Amad Mau, Randall An, LCSW

## 2016-07-16 DIAGNOSIS — Z8719 Personal history of other diseases of the digestive system: Secondary | ICD-10-CM | POA: Diagnosis not present

## 2016-07-16 DIAGNOSIS — I959 Hypotension, unspecified: Secondary | ICD-10-CM | POA: Diagnosis not present

## 2016-07-16 DIAGNOSIS — G300 Alzheimer's disease with early onset: Secondary | ICD-10-CM | POA: Diagnosis not present

## 2016-07-16 DIAGNOSIS — R69 Illness, unspecified: Secondary | ICD-10-CM | POA: Diagnosis not present

## 2016-07-18 DIAGNOSIS — D649 Anemia, unspecified: Secondary | ICD-10-CM | POA: Diagnosis not present

## 2016-07-18 DIAGNOSIS — Z79899 Other long term (current) drug therapy: Secondary | ICD-10-CM | POA: Diagnosis not present

## 2016-07-25 DIAGNOSIS — R32 Unspecified urinary incontinence: Secondary | ICD-10-CM | POA: Diagnosis not present

## 2016-07-27 DIAGNOSIS — M6281 Muscle weakness (generalized): Secondary | ICD-10-CM | POA: Diagnosis not present

## 2016-07-27 DIAGNOSIS — R278 Other lack of coordination: Secondary | ICD-10-CM | POA: Diagnosis not present

## 2016-07-31 ENCOUNTER — Telehealth: Payer: Self-pay | Admitting: Family Medicine

## 2016-07-31 DIAGNOSIS — F0281 Dementia in other diseases classified elsewhere with behavioral disturbance: Secondary | ICD-10-CM | POA: Diagnosis not present

## 2016-07-31 DIAGNOSIS — G309 Alzheimer's disease, unspecified: Secondary | ICD-10-CM | POA: Diagnosis not present

## 2016-07-31 DIAGNOSIS — R69 Illness, unspecified: Secondary | ICD-10-CM | POA: Diagnosis not present

## 2016-07-31 DIAGNOSIS — F321 Major depressive disorder, single episode, moderate: Secondary | ICD-10-CM | POA: Diagnosis not present

## 2016-07-31 NOTE — Telephone Encounter (Signed)
Called pt to schedule Annual Wellness Visit with Nurse Health Advisor for July appt:  - knb

## 2016-08-01 DIAGNOSIS — E119 Type 2 diabetes mellitus without complications: Secondary | ICD-10-CM | POA: Diagnosis not present

## 2016-08-01 DIAGNOSIS — Z5181 Encounter for therapeutic drug level monitoring: Secondary | ICD-10-CM | POA: Diagnosis not present

## 2016-08-01 DIAGNOSIS — D649 Anemia, unspecified: Secondary | ICD-10-CM | POA: Diagnosis not present

## 2016-08-01 DIAGNOSIS — E785 Hyperlipidemia, unspecified: Secondary | ICD-10-CM | POA: Diagnosis not present

## 2016-08-18 DIAGNOSIS — N390 Urinary tract infection, site not specified: Secondary | ICD-10-CM | POA: Diagnosis not present

## 2016-08-18 DIAGNOSIS — D649 Anemia, unspecified: Secondary | ICD-10-CM | POA: Diagnosis not present

## 2016-08-18 DIAGNOSIS — R4182 Altered mental status, unspecified: Secondary | ICD-10-CM | POA: Diagnosis not present

## 2016-08-18 DIAGNOSIS — Z79899 Other long term (current) drug therapy: Secondary | ICD-10-CM | POA: Diagnosis not present

## 2016-08-18 DIAGNOSIS — E559 Vitamin D deficiency, unspecified: Secondary | ICD-10-CM | POA: Diagnosis not present

## 2016-08-18 DIAGNOSIS — E119 Type 2 diabetes mellitus without complications: Secondary | ICD-10-CM | POA: Diagnosis not present

## 2016-08-18 DIAGNOSIS — R319 Hematuria, unspecified: Secondary | ICD-10-CM | POA: Diagnosis not present

## 2016-08-23 DIAGNOSIS — R32 Unspecified urinary incontinence: Secondary | ICD-10-CM | POA: Diagnosis not present

## 2016-08-28 DIAGNOSIS — R278 Other lack of coordination: Secondary | ICD-10-CM | POA: Diagnosis not present

## 2016-08-28 DIAGNOSIS — M6281 Muscle weakness (generalized): Secondary | ICD-10-CM | POA: Diagnosis not present

## 2016-09-03 DIAGNOSIS — G300 Alzheimer's disease with early onset: Secondary | ICD-10-CM | POA: Diagnosis not present

## 2016-09-03 DIAGNOSIS — R69 Illness, unspecified: Secondary | ICD-10-CM | POA: Diagnosis not present

## 2016-09-03 DIAGNOSIS — L22 Diaper dermatitis: Secondary | ICD-10-CM | POA: Diagnosis not present

## 2016-09-03 DIAGNOSIS — R600 Localized edema: Secondary | ICD-10-CM | POA: Diagnosis not present

## 2016-09-05 DIAGNOSIS — R278 Other lack of coordination: Secondary | ICD-10-CM | POA: Diagnosis not present

## 2016-09-05 DIAGNOSIS — R52 Pain, unspecified: Secondary | ICD-10-CM | POA: Diagnosis not present

## 2016-09-05 DIAGNOSIS — M6281 Muscle weakness (generalized): Secondary | ICD-10-CM | POA: Diagnosis not present

## 2016-09-08 DIAGNOSIS — M6281 Muscle weakness (generalized): Secondary | ICD-10-CM | POA: Diagnosis not present

## 2016-09-08 DIAGNOSIS — R278 Other lack of coordination: Secondary | ICD-10-CM | POA: Diagnosis not present

## 2016-09-10 DIAGNOSIS — R278 Other lack of coordination: Secondary | ICD-10-CM | POA: Diagnosis not present

## 2016-09-10 DIAGNOSIS — M6281 Muscle weakness (generalized): Secondary | ICD-10-CM | POA: Diagnosis not present

## 2016-09-13 DIAGNOSIS — M6281 Muscle weakness (generalized): Secondary | ICD-10-CM | POA: Diagnosis not present

## 2016-09-13 DIAGNOSIS — R278 Other lack of coordination: Secondary | ICD-10-CM | POA: Diagnosis not present

## 2016-09-18 DIAGNOSIS — F321 Major depressive disorder, single episode, moderate: Secondary | ICD-10-CM | POA: Diagnosis not present

## 2016-09-18 DIAGNOSIS — R278 Other lack of coordination: Secondary | ICD-10-CM | POA: Diagnosis not present

## 2016-09-18 DIAGNOSIS — R69 Illness, unspecified: Secondary | ICD-10-CM | POA: Diagnosis not present

## 2016-09-18 DIAGNOSIS — G309 Alzheimer's disease, unspecified: Secondary | ICD-10-CM | POA: Diagnosis not present

## 2016-09-18 DIAGNOSIS — M6281 Muscle weakness (generalized): Secondary | ICD-10-CM | POA: Diagnosis not present

## 2016-09-18 DIAGNOSIS — F0281 Dementia in other diseases classified elsewhere with behavioral disturbance: Secondary | ICD-10-CM | POA: Diagnosis not present

## 2016-09-19 DIAGNOSIS — M6281 Muscle weakness (generalized): Secondary | ICD-10-CM | POA: Diagnosis not present

## 2016-09-19 DIAGNOSIS — R278 Other lack of coordination: Secondary | ICD-10-CM | POA: Diagnosis not present

## 2016-09-24 DIAGNOSIS — R32 Unspecified urinary incontinence: Secondary | ICD-10-CM | POA: Diagnosis not present

## 2016-10-02 DIAGNOSIS — M6281 Muscle weakness (generalized): Secondary | ICD-10-CM | POA: Diagnosis not present

## 2016-10-02 DIAGNOSIS — R278 Other lack of coordination: Secondary | ICD-10-CM | POA: Diagnosis not present

## 2016-10-09 DIAGNOSIS — M6281 Muscle weakness (generalized): Secondary | ICD-10-CM | POA: Diagnosis not present

## 2016-10-09 DIAGNOSIS — R278 Other lack of coordination: Secondary | ICD-10-CM | POA: Diagnosis not present

## 2016-10-16 DIAGNOSIS — R278 Other lack of coordination: Secondary | ICD-10-CM | POA: Diagnosis not present

## 2016-10-16 DIAGNOSIS — M6281 Muscle weakness (generalized): Secondary | ICD-10-CM | POA: Diagnosis not present

## 2016-10-24 DIAGNOSIS — R32 Unspecified urinary incontinence: Secondary | ICD-10-CM | POA: Diagnosis not present

## 2016-10-25 ENCOUNTER — Telehealth: Payer: Self-pay | Admitting: Family Medicine

## 2016-10-25 NOTE — Telephone Encounter (Signed)
Called pt to schedule for Annual Wellness Visit with Nurse Health Advisor, Tiffany Hill, my c/b # is [phone]  Kathryn Brown ° °

## 2016-10-31 DIAGNOSIS — R278 Other lack of coordination: Secondary | ICD-10-CM | POA: Diagnosis not present

## 2016-10-31 DIAGNOSIS — M6281 Muscle weakness (generalized): Secondary | ICD-10-CM | POA: Diagnosis not present

## 2016-11-20 DIAGNOSIS — R69 Illness, unspecified: Secondary | ICD-10-CM | POA: Diagnosis not present

## 2016-11-20 DIAGNOSIS — G309 Alzheimer's disease, unspecified: Secondary | ICD-10-CM | POA: Diagnosis not present

## 2016-11-20 DIAGNOSIS — F0281 Dementia in other diseases classified elsewhere with behavioral disturbance: Secondary | ICD-10-CM | POA: Diagnosis not present

## 2016-11-20 DIAGNOSIS — F321 Major depressive disorder, single episode, moderate: Secondary | ICD-10-CM | POA: Diagnosis not present

## 2016-11-22 DIAGNOSIS — R32 Unspecified urinary incontinence: Secondary | ICD-10-CM | POA: Diagnosis not present

## 2016-11-26 DIAGNOSIS — G300 Alzheimer's disease with early onset: Secondary | ICD-10-CM | POA: Diagnosis not present

## 2016-11-26 DIAGNOSIS — L308 Other specified dermatitis: Secondary | ICD-10-CM | POA: Diagnosis not present

## 2016-11-26 DIAGNOSIS — R69 Illness, unspecified: Secondary | ICD-10-CM | POA: Diagnosis not present

## 2016-11-26 DIAGNOSIS — L22 Diaper dermatitis: Secondary | ICD-10-CM | POA: Diagnosis not present

## 2016-12-12 DIAGNOSIS — M6281 Muscle weakness (generalized): Secondary | ICD-10-CM | POA: Diagnosis not present

## 2016-12-12 DIAGNOSIS — R278 Other lack of coordination: Secondary | ICD-10-CM | POA: Diagnosis not present

## 2016-12-13 DIAGNOSIS — M6281 Muscle weakness (generalized): Secondary | ICD-10-CM | POA: Diagnosis not present

## 2016-12-13 DIAGNOSIS — R278 Other lack of coordination: Secondary | ICD-10-CM | POA: Diagnosis not present

## 2016-12-14 DIAGNOSIS — M6281 Muscle weakness (generalized): Secondary | ICD-10-CM | POA: Diagnosis not present

## 2016-12-14 DIAGNOSIS — R278 Other lack of coordination: Secondary | ICD-10-CM | POA: Diagnosis not present

## 2016-12-17 DIAGNOSIS — R278 Other lack of coordination: Secondary | ICD-10-CM | POA: Diagnosis not present

## 2016-12-17 DIAGNOSIS — M6281 Muscle weakness (generalized): Secondary | ICD-10-CM | POA: Diagnosis not present

## 2016-12-20 DIAGNOSIS — R278 Other lack of coordination: Secondary | ICD-10-CM | POA: Diagnosis not present

## 2016-12-20 DIAGNOSIS — M6281 Muscle weakness (generalized): Secondary | ICD-10-CM | POA: Diagnosis not present

## 2016-12-21 DIAGNOSIS — M6281 Muscle weakness (generalized): Secondary | ICD-10-CM | POA: Diagnosis not present

## 2016-12-21 DIAGNOSIS — R278 Other lack of coordination: Secondary | ICD-10-CM | POA: Diagnosis not present

## 2016-12-24 DIAGNOSIS — R32 Unspecified urinary incontinence: Secondary | ICD-10-CM | POA: Diagnosis not present

## 2016-12-25 DIAGNOSIS — R278 Other lack of coordination: Secondary | ICD-10-CM | POA: Diagnosis not present

## 2016-12-25 DIAGNOSIS — M6281 Muscle weakness (generalized): Secondary | ICD-10-CM | POA: Diagnosis not present

## 2016-12-31 DIAGNOSIS — M6281 Muscle weakness (generalized): Secondary | ICD-10-CM | POA: Diagnosis not present

## 2016-12-31 DIAGNOSIS — R278 Other lack of coordination: Secondary | ICD-10-CM | POA: Diagnosis not present

## 2017-01-08 DIAGNOSIS — R69 Illness, unspecified: Secondary | ICD-10-CM | POA: Diagnosis not present

## 2017-01-08 DIAGNOSIS — F0281 Dementia in other diseases classified elsewhere with behavioral disturbance: Secondary | ICD-10-CM | POA: Diagnosis not present

## 2017-01-08 DIAGNOSIS — F321 Major depressive disorder, single episode, moderate: Secondary | ICD-10-CM | POA: Diagnosis not present

## 2017-01-08 DIAGNOSIS — G309 Alzheimer's disease, unspecified: Secondary | ICD-10-CM | POA: Diagnosis not present

## 2017-01-10 DIAGNOSIS — M25561 Pain in right knee: Secondary | ICD-10-CM | POA: Diagnosis not present

## 2017-01-24 DIAGNOSIS — R32 Unspecified urinary incontinence: Secondary | ICD-10-CM | POA: Diagnosis not present

## 2017-02-04 DIAGNOSIS — R69 Illness, unspecified: Secondary | ICD-10-CM | POA: Diagnosis not present

## 2017-02-05 DIAGNOSIS — F0281 Dementia in other diseases classified elsewhere with behavioral disturbance: Secondary | ICD-10-CM | POA: Diagnosis not present

## 2017-02-05 DIAGNOSIS — G309 Alzheimer's disease, unspecified: Secondary | ICD-10-CM | POA: Diagnosis not present

## 2017-02-05 DIAGNOSIS — R69 Illness, unspecified: Secondary | ICD-10-CM | POA: Diagnosis not present

## 2017-02-05 DIAGNOSIS — F321 Major depressive disorder, single episode, moderate: Secondary | ICD-10-CM | POA: Diagnosis not present

## 2017-02-22 DIAGNOSIS — R32 Unspecified urinary incontinence: Secondary | ICD-10-CM | POA: Diagnosis not present

## 2017-03-11 DIAGNOSIS — J069 Acute upper respiratory infection, unspecified: Secondary | ICD-10-CM | POA: Diagnosis not present

## 2017-03-11 DIAGNOSIS — G300 Alzheimer's disease with early onset: Secondary | ICD-10-CM | POA: Diagnosis not present

## 2017-03-11 DIAGNOSIS — R69 Illness, unspecified: Secondary | ICD-10-CM | POA: Diagnosis not present

## 2017-03-27 DIAGNOSIS — R32 Unspecified urinary incontinence: Secondary | ICD-10-CM | POA: Diagnosis not present

## 2017-03-27 DIAGNOSIS — H3530 Unspecified macular degeneration: Secondary | ICD-10-CM | POA: Diagnosis not present

## 2017-03-27 DIAGNOSIS — H2511 Age-related nuclear cataract, right eye: Secondary | ICD-10-CM | POA: Diagnosis not present

## 2017-03-27 DIAGNOSIS — Z961 Presence of intraocular lens: Secondary | ICD-10-CM | POA: Diagnosis not present

## 2017-04-02 DIAGNOSIS — R69 Illness, unspecified: Secondary | ICD-10-CM | POA: Diagnosis not present

## 2017-04-02 DIAGNOSIS — F321 Major depressive disorder, single episode, moderate: Secondary | ICD-10-CM | POA: Diagnosis not present

## 2017-04-02 DIAGNOSIS — F0281 Dementia in other diseases classified elsewhere with behavioral disturbance: Secondary | ICD-10-CM | POA: Diagnosis not present

## 2017-04-02 DIAGNOSIS — G309 Alzheimer's disease, unspecified: Secondary | ICD-10-CM | POA: Diagnosis not present

## 2017-04-10 DIAGNOSIS — Z5181 Encounter for therapeutic drug level monitoring: Secondary | ICD-10-CM | POA: Diagnosis not present

## 2017-04-10 DIAGNOSIS — Z79899 Other long term (current) drug therapy: Secondary | ICD-10-CM | POA: Diagnosis not present

## 2017-04-10 DIAGNOSIS — E119 Type 2 diabetes mellitus without complications: Secondary | ICD-10-CM | POA: Diagnosis not present

## 2017-04-10 DIAGNOSIS — D649 Anemia, unspecified: Secondary | ICD-10-CM | POA: Diagnosis not present

## 2017-04-10 DIAGNOSIS — E785 Hyperlipidemia, unspecified: Secondary | ICD-10-CM | POA: Diagnosis not present

## 2017-04-24 DIAGNOSIS — R32 Unspecified urinary incontinence: Secondary | ICD-10-CM | POA: Diagnosis not present

## 2017-05-21 DIAGNOSIS — G309 Alzheimer's disease, unspecified: Secondary | ICD-10-CM | POA: Diagnosis not present

## 2017-05-21 DIAGNOSIS — F321 Major depressive disorder, single episode, moderate: Secondary | ICD-10-CM | POA: Diagnosis not present

## 2017-05-21 DIAGNOSIS — F0281 Dementia in other diseases classified elsewhere with behavioral disturbance: Secondary | ICD-10-CM | POA: Diagnosis not present

## 2017-05-21 DIAGNOSIS — R69 Illness, unspecified: Secondary | ICD-10-CM | POA: Diagnosis not present

## 2017-05-23 DIAGNOSIS — R32 Unspecified urinary incontinence: Secondary | ICD-10-CM | POA: Diagnosis not present

## 2017-06-18 DIAGNOSIS — G300 Alzheimer's disease with early onset: Secondary | ICD-10-CM | POA: Diagnosis not present

## 2017-06-18 DIAGNOSIS — R69 Illness, unspecified: Secondary | ICD-10-CM | POA: Diagnosis not present

## 2017-06-18 DIAGNOSIS — Z87440 Personal history of urinary (tract) infections: Secondary | ICD-10-CM | POA: Diagnosis not present

## 2017-06-20 DIAGNOSIS — N390 Urinary tract infection, site not specified: Secondary | ICD-10-CM | POA: Diagnosis not present

## 2017-06-20 DIAGNOSIS — Z79899 Other long term (current) drug therapy: Secondary | ICD-10-CM | POA: Diagnosis not present

## 2017-06-20 DIAGNOSIS — R4182 Altered mental status, unspecified: Secondary | ICD-10-CM | POA: Diagnosis not present

## 2017-06-25 DIAGNOSIS — R32 Unspecified urinary incontinence: Secondary | ICD-10-CM | POA: Diagnosis not present

## 2017-07-09 DIAGNOSIS — F0281 Dementia in other diseases classified elsewhere with behavioral disturbance: Secondary | ICD-10-CM | POA: Diagnosis not present

## 2017-07-09 DIAGNOSIS — G309 Alzheimer's disease, unspecified: Secondary | ICD-10-CM | POA: Diagnosis not present

## 2017-07-09 DIAGNOSIS — F321 Major depressive disorder, single episode, moderate: Secondary | ICD-10-CM | POA: Diagnosis not present

## 2017-07-09 DIAGNOSIS — R69 Illness, unspecified: Secondary | ICD-10-CM | POA: Diagnosis not present

## 2017-07-23 DIAGNOSIS — G309 Alzheimer's disease, unspecified: Secondary | ICD-10-CM | POA: Diagnosis not present

## 2017-07-23 DIAGNOSIS — R69 Illness, unspecified: Secondary | ICD-10-CM | POA: Diagnosis not present

## 2017-07-23 DIAGNOSIS — F321 Major depressive disorder, single episode, moderate: Secondary | ICD-10-CM | POA: Diagnosis not present

## 2017-07-23 DIAGNOSIS — F0280 Dementia in other diseases classified elsewhere without behavioral disturbance: Secondary | ICD-10-CM | POA: Diagnosis not present

## 2017-07-25 DIAGNOSIS — R32 Unspecified urinary incontinence: Secondary | ICD-10-CM | POA: Diagnosis not present

## 2017-08-15 DIAGNOSIS — R001 Bradycardia, unspecified: Secondary | ICD-10-CM | POA: Diagnosis not present

## 2017-08-15 DIAGNOSIS — N3000 Acute cystitis without hematuria: Secondary | ICD-10-CM | POA: Diagnosis not present

## 2017-08-15 DIAGNOSIS — S0093XA Contusion of unspecified part of head, initial encounter: Secondary | ICD-10-CM | POA: Diagnosis not present

## 2017-08-16 ENCOUNTER — Emergency Department (HOSPITAL_COMMUNITY): Payer: Medicare HMO

## 2017-08-16 ENCOUNTER — Other Ambulatory Visit: Payer: Self-pay

## 2017-08-16 ENCOUNTER — Emergency Department (HOSPITAL_COMMUNITY)
Admission: EM | Admit: 2017-08-16 | Discharge: 2017-08-16 | Disposition: A | Discharge status: Home / Self Care | Payer: Medicare HMO | Attending: Emergency Medicine | Admitting: Emergency Medicine

## 2017-08-16 DIAGNOSIS — Z79899 Other long term (current) drug therapy: Secondary | ICD-10-CM | POA: Insufficient documentation

## 2017-08-16 DIAGNOSIS — Y92129 Unspecified place in nursing home as the place of occurrence of the external cause: Secondary | ICD-10-CM | POA: Insufficient documentation

## 2017-08-16 DIAGNOSIS — Z9049 Acquired absence of other specified parts of digestive tract: Secondary | ICD-10-CM | POA: Insufficient documentation

## 2017-08-16 DIAGNOSIS — Z7902 Long term (current) use of antithrombotics/antiplatelets: Secondary | ICD-10-CM | POA: Insufficient documentation

## 2017-08-16 DIAGNOSIS — I5042 Chronic combined systolic (congestive) and diastolic (congestive) heart failure: Secondary | ICD-10-CM | POA: Insufficient documentation

## 2017-08-16 DIAGNOSIS — N183 Chronic kidney disease, stage 3 (moderate): Secondary | ICD-10-CM | POA: Insufficient documentation

## 2017-08-16 DIAGNOSIS — Y998 Other external cause status: Secondary | ICD-10-CM | POA: Insufficient documentation

## 2017-08-16 DIAGNOSIS — Y9389 Activity, other specified: Secondary | ICD-10-CM | POA: Insufficient documentation

## 2017-08-16 DIAGNOSIS — R0902 Hypoxemia: Secondary | ICD-10-CM | POA: Diagnosis not present

## 2017-08-16 DIAGNOSIS — I130 Hypertensive heart and chronic kidney disease with heart failure and stage 1 through stage 4 chronic kidney disease, or unspecified chronic kidney disease: Secondary | ICD-10-CM | POA: Diagnosis not present

## 2017-08-16 DIAGNOSIS — W0110XA Fall on same level from slipping, tripping and stumbling with subsequent striking against unspecified object, initial encounter: Secondary | ICD-10-CM | POA: Diagnosis not present

## 2017-08-16 DIAGNOSIS — M2550 Pain in unspecified joint: Secondary | ICD-10-CM | POA: Diagnosis not present

## 2017-08-16 DIAGNOSIS — F0390 Unspecified dementia without behavioral disturbance: Secondary | ICD-10-CM | POA: Diagnosis not present

## 2017-08-16 DIAGNOSIS — R69 Illness, unspecified: Secondary | ICD-10-CM | POA: Diagnosis not present

## 2017-08-16 DIAGNOSIS — S199XXA Unspecified injury of neck, initial encounter: Secondary | ICD-10-CM | POA: Diagnosis not present

## 2017-08-16 DIAGNOSIS — I252 Old myocardial infarction: Secondary | ICD-10-CM | POA: Diagnosis not present

## 2017-08-16 DIAGNOSIS — R0989 Other specified symptoms and signs involving the circulatory and respiratory systems: Secondary | ICD-10-CM | POA: Diagnosis not present

## 2017-08-16 DIAGNOSIS — R001 Bradycardia, unspecified: Secondary | ICD-10-CM | POA: Diagnosis not present

## 2017-08-16 DIAGNOSIS — W19XXXA Unspecified fall, initial encounter: Secondary | ICD-10-CM | POA: Diagnosis not present

## 2017-08-16 DIAGNOSIS — Z955 Presence of coronary angioplasty implant and graft: Secondary | ICD-10-CM | POA: Insufficient documentation

## 2017-08-16 DIAGNOSIS — I491 Atrial premature depolarization: Secondary | ICD-10-CM | POA: Diagnosis not present

## 2017-08-16 DIAGNOSIS — N3000 Acute cystitis without hematuria: Secondary | ICD-10-CM | POA: Diagnosis not present

## 2017-08-16 DIAGNOSIS — Z87891 Personal history of nicotine dependence: Secondary | ICD-10-CM | POA: Diagnosis not present

## 2017-08-16 DIAGNOSIS — S0083XA Contusion of other part of head, initial encounter: Secondary | ICD-10-CM

## 2017-08-16 DIAGNOSIS — Z7401 Bed confinement status: Secondary | ICD-10-CM | POA: Diagnosis not present

## 2017-08-16 DIAGNOSIS — S0990XA Unspecified injury of head, initial encounter: Secondary | ICD-10-CM | POA: Diagnosis not present

## 2017-08-16 DIAGNOSIS — I2510 Atherosclerotic heart disease of native coronary artery without angina pectoris: Secondary | ICD-10-CM | POA: Diagnosis not present

## 2017-08-16 DIAGNOSIS — S0003XA Contusion of scalp, initial encounter: Secondary | ICD-10-CM | POA: Diagnosis not present

## 2017-08-16 DIAGNOSIS — S0093XA Contusion of unspecified part of head, initial encounter: Secondary | ICD-10-CM | POA: Diagnosis not present

## 2017-08-16 DIAGNOSIS — R410 Disorientation, unspecified: Secondary | ICD-10-CM | POA: Diagnosis not present

## 2017-08-16 LAB — BASIC METABOLIC PANEL
Anion gap: 6 (ref 5–15)
BUN: 17 mg/dL (ref 6–20)
CO2: 27 mmol/L (ref 22–32)
Calcium: 9.6 mg/dL (ref 8.9–10.3)
Chloride: 109 mmol/L (ref 101–111)
Creatinine, Ser: 1.19 mg/dL — ABNORMAL HIGH (ref 0.44–1.00)
GFR calc Af Amer: 51 mL/min — ABNORMAL LOW (ref >60)
GFR calc non Af Amer: 44 mL/min — ABNORMAL LOW (ref >60)
Glucose, Bld: 96 mg/dL (ref 65–99)
Potassium: 3.8 mmol/L (ref 3.5–5.1)
SODIUM: 142 mmol/L (ref 135–145)

## 2017-08-16 LAB — URINALYSIS, ROUTINE W REFLEX MICROSCOPIC
BILIRUBIN URINE: NEGATIVE
Glucose, UA: NEGATIVE mg/dL
HGB URINE DIPSTICK: NEGATIVE
KETONES UR: NEGATIVE mg/dL
NITRITE: POSITIVE — AB
PROTEIN: NEGATIVE mg/dL
SPECIFIC GRAVITY, URINE: 1.01 (ref 1.005–1.030)
pH: 5 (ref 5.0–8.0)

## 2017-08-16 LAB — CBC WITH DIFFERENTIAL/PLATELET
ABS IMMATURE GRANULOCYTES: 0 10*3/uL (ref 0.0–0.1)
BASOS ABS: 0.1 10*3/uL (ref 0.0–0.1)
BASOS PCT: 1 %
EOS ABS: 0.3 10*3/uL (ref 0.0–0.7)
Eosinophils Relative: 4 %
HCT: 38.1 % (ref 36.0–46.0)
Hemoglobin: 11.9 g/dL — ABNORMAL LOW (ref 12.0–15.0)
Immature Granulocytes: 0 %
Lymphocytes Relative: 26 %
Lymphs Abs: 2.1 10*3/uL (ref 0.7–4.0)
MCH: 29 pg (ref 26.0–34.0)
MCHC: 31.2 g/dL (ref 30.0–36.0)
MCV: 92.7 fL (ref 78.0–100.0)
MONO ABS: 0.8 10*3/uL (ref 0.1–1.0)
Monocytes Relative: 10 %
NEUTROS ABS: 4.8 10*3/uL (ref 1.7–7.7)
Neutrophils Relative %: 59 %
PLATELETS: 194 10*3/uL (ref 150–400)
RBC: 4.11 MIL/uL (ref 3.87–5.11)
RDW: 14.5 % (ref 11.5–15.5)
WBC: 8.1 10*3/uL (ref 4.0–10.5)

## 2017-08-16 LAB — CBG MONITORING, ED: Glucose-Capillary: 91 mg/dL (ref 65–99)

## 2017-08-16 MED ORDER — SODIUM CHLORIDE 0.9 % IV BOLUS
500.0000 mL | Freq: Once | INTRAVENOUS | Status: AC
  Administered 2017-08-16: 500 mL via INTRAVENOUS

## 2017-08-16 MED ORDER — CEFTRIAXONE SODIUM 1 G IJ SOLR
1.0000 g | Freq: Once | INTRAMUSCULAR | Status: AC
  Administered 2017-08-16: 1 g via INTRAVENOUS
  Filled 2017-08-16: qty 10

## 2017-08-16 MED ORDER — CEPHALEXIN 500 MG PO CAPS: 500.0000 mg | ORAL_CAPSULE | Freq: Three times a day (TID) | ORAL | 0 refills | Status: AC

## 2017-08-16 NOTE — ED Notes (Signed)
Attempted report to [facility] x2 with no answer.

## 2017-08-16 NOTE — ED Notes (Signed)
[facility] called for transportation back to [facility] w/ no special equipment needed for transport.

## 2017-08-16 NOTE — ED Notes (Signed)
Back from CT

## 2017-08-16 NOTE — ED Provider Notes (Signed)
Kaitlyn Simmons   CSN: [id number] Arrival date & time: 08/16/17  0102     History   Chief Complaint Chief Complaint  Patient presents with  . Fall    HPI Kaitlyn Simmons is a 72 y.o. female.  HPI 72 year old female with history of coronary disease, heart failure, hypertension, vascular dementia, here with fall.  Patient reportedly is often drowsy at night.  She takes Risperdal.  She was trying to stand up with assistance when she reportedly fell asleep while they were trying to arrange her down.  She fell and struck her head.  She did not lose consciousness.  There is no seizure-like activity.  Patient is drowsy but per facility, this is her baseline after she takes her nighttime meds with no significant changes.  No recent illnesses.  Level 5 caveat invoked as remainder of history, ROS, and physical exam limited due to patient's dementia.   Past Medical History:  Diagnosis Date  . CAD (coronary artery disease)    a. s/p [place] STEMI 7/11 => tx with BMS to RCA (tx at [facility]);  b.  AL STEMI 7/12 => LHC: mRCA 100% ==> PCI with BMS to RCA; LM 10%, pCFX 50%, OM1 10%, pLAD 50%, mLAD 99%, 70%  ==>  c. staged PCI: BMS to  LAD (tx at [facility])  . Cataracts, bilateral   . Chronic systolic heart failure ([place])   . Dementia   . GERD (gastroesophageal reflux disease)   . HTN (hypertension)   . Hyperlipidemia   . Ischemic cardiomyopathy    a. EF 30-35% at time of AL STEMI 7/12;   b. IMPROVED by f/u Echo 9/13:  mild LVH, EF 45-50%, mid to dist Inf, AS, Apical HK, Gr 1 diast dysfn, MAC, mod LAE  . STEMI (ST elevation myocardial infarction) ([place]) 2011; 2012   [place]; [place]    Patient Active Problem List   Diagnosis Date Noted  . Coronary artery disease involving native coronary artery of native heart without angina pectoris   . Lower GI bleed 07/08/2016  . CKD (chronic kidney disease), stage III ([place]) 07/08/2016  . Normocytic anemia 07/08/2016  . Chronic  combined systolic and diastolic CHF (congestive heart failure) ([place]) 07/08/2016  . Rectal bleeding   . Hematochezia   . Acute blood loss anemia   . Alzheimer's dementia, late onset, with behavioral disturbance 06/21/2015  . Body mass index 40.0-44.9, adult ([place]) 12/17/2014  . Morbid obesity ([place]) 12/17/2014  . Depression 09/17/2014  . Calculus of bile duct without mention of cholecystitis or obstruction 10/08/2012  . Abnormal cholangiogram 10/08/2012  . Mucosal abnormality of duodenum 10/08/2012  . Coronary artery disease due to lipid rich plaque   . Chronic systolic CHF (congestive heart failure) ([place])   . Essential hypertension   . Hyperlipidemia   . Ischemic cardiomyopathy     Past Surgical History:  Procedure Laterality Date  . CATARACT EXTRACTION W/ INTRAOCULAR LENS IMPLANT Left ~ 2008  . CHOLECYSTECTOMY N/A 10/07/2012   Procedure: LAPAROSCOPIC CHOLECYSTECTOMY WITH INTRAOPERATIVE CHOLANGIOGRAM;  Surgeon: Shann Medal, MD;  Location: [facility]S;  Service: General;  Laterality: N/A;  . CORONARY ANGIOPLASTY WITH STENT PLACEMENT  2011; 2012   RCA; RCA & LAD  . DILATION AND CURETTAGE OF UTERUS  1970's  . ERCP N/A 10/08/2012   Procedure: ENDOSCOPIC RETROGRADE CHOLANGIOPANCREATOGRAPHY (ERCP);  Surgeon: Gatha Mayer, MD;  Location: [facility];  Service: Endoscopy;  Laterality: N/A;  . ERCP W/ SPHICTEROTOMY  11/10/2012   stones removed [place]  . TONSILLECTOMY AND ADENOIDECTOMY  1980's     OB History   None      Home Medications    Prior to Admission medications   Medication Sig Start Date End Date Taking? Authorizing Provider  acetaminophen (TYLENOL) 500 MG tablet Take 500 mg by mouth every 4 (four) hours as needed for moderate pain or headache.    [provider]  alum & mag hydroxide-simeth ([place]) 200-200-20 MG/5ML suspension Take 30 mLs by mouth every 6 (six) hours as needed for indigestion or heartburn.    [provider]  atorvastatin (LIPITOR) 80 MG  tablet Take 1 tablet (80 mg total) by mouth at bedtime. 06/27/15   Carrie Mew, MD  cephALEXin (KEFLEX) 500 MG capsule Take 1 capsule (500 mg total) by mouth 3 (three) times daily for 7 days. 08/16/17 08/23/17  Duffy Bruce, MD  clopidogrel (PLAVIX) 75 MG tablet Take 1 tablet (75 mg total) by mouth daily. 07/14/16   Orson Eva, MD  FLUoxetine (PROZAC) 10 MG capsule Take 40 mg by mouth daily.     [provider]  furosemide (LASIX) 40 MG tablet Take 1 tablet (40 mg total) by mouth every morning. 06/27/15   Carrie Mew, MD  guaifenesin (ROBITUSSIN) 100 MG/5ML syrup Take 200 mg by mouth every 6 (six) hours as needed for cough.     [provider]  lisinopril (ZESTRIL) 2.5 MG tablet Take 1 tablet (2.5 mg total) by mouth daily. 06/27/15   Carrie Mew, MD  magnesium hydroxide (MILK OF MAGNESIA) 400 MG/5ML suspension Take 30 mLs by mouth at bedtime as needed for moderate constipation.     [provider]  Melatonin 5 MG TABS Take 5 mg by mouth at bedtime.    [provider]  memantine (NAMENDA) 5 MG tablet Take 5 mg by mouth 2 (two) times daily.    [provider]  Multiple Vitamins-Minerals (OCUVITE ADULT 50+) CAPS Take 1 capsule by mouth every morning.    [provider]  Neomycin-Bacitracin-Polymyxin (TRIPLE ANTIBIOTIC) [phone] OINT Apply 1 application topically as needed (wound care).    [provider]  nitroGLYCERIN (NITROSTAT) 0.4 MG SL tablet Place 1 tablet (0.4 mg total) under the tongue every 5 (five) minutes as needed for chest pain. 06/27/15   Carrie Mew, MD  risperiDONE (RISPERDAL) 1 MG tablet Take 1 mg by mouth at bedtime.    [provider]  rivastigmine (EXELON) 1.5 MG capsule Take 1.5 mg by mouth 2 (two) times daily.    [provider]    Family History Family History  Problem Relation Age of Onset  . Heart attack Father        DECEASED  . Stroke Father        DECEASED  . Leukemia  Mother        DECEASED  . Heart attack Sister        ALIVE  . Hypertension Sister     Social History Social History   Tobacco Use  . Smoking status: Former Smoker    Packs/day: 2.00    Years: 30.00    Pack years: 60.00    Types: Cigarettes    Last attempt to quit: 10/25/2002    Years since quitting: 14.8  . Smokeless tobacco: Never Used  Substance Use Topics  . Alcohol use: No  . Drug use: No     Allergies   Patient has no known allergies.   Review of  Systems Review of Systems  Unable to perform ROS: Dementia     Physical Exam Updated Vital Signs BP 131/63   Pulse (!) 41   Temp (!) 97.4 F (36.3 C) (Axillary)   Resp 17   SpO2 93%   Physical Exam  Constitutional: She appears well-developed and well-nourished. No distress.  HENT:  Head: Normocephalic and atraumatic.  Nonpulsatile, approximately 3 x 3 cm hematoma to the right brow.  No apparent involvement of the orbits.  No palpable crepitance or deformity.  Eyes: Conjunctivae are normal.  Neck: Neck supple.  Cardiovascular: Normal rate, regular rhythm and normal heart sounds. Exam reveals no friction rub.  No murmur heard. Pulmonary/Chest: Effort normal and breath sounds normal. No respiratory distress. She has no wheezes. She has no rales.  Abdominal: She exhibits no distension.  Musculoskeletal: She exhibits no edema.  Neurological:  Drowsy but will respond to her name and painful stimuli.  Moves all extremities.  Withdraws to painful stimuli in bilateral upper and lower extremities.  Able to follow commands.  Easily falls asleep.  Speech slightly slurred.  Skin: Skin is warm. Capillary refill takes less than 2 seconds.  Psychiatric: She has a normal mood and affect.  Nursing Simmons and vitals reviewed.    ED Treatments / Results  Labs (all labs ordered are listed, but only abnormal results are displayed) Labs Reviewed  CBC WITH DIFFERENTIAL/PLATELET - Abnormal; Notable for the following components:        Result Value   Hemoglobin 11.9 (*)    All other components within normal limits  BASIC METABOLIC PANEL - Abnormal; Notable for the following components:   Creatinine, Ser 1.19 (*)    GFR calc non Af Amer 44 (*)    GFR calc Af Amer 51 (*)    All other components within normal limits  URINALYSIS, ROUTINE W REFLEX MICROSCOPIC - Abnormal; Notable for the following components:   APPearance HAZY (*)    Nitrite POSITIVE (*)    Leukocytes, UA MODERATE (*)    Bacteria, UA RARE (*)    All other components within normal limits  URINE CULTURE  CBG MONITORING, ED    EKG EKG Interpretation  Date/Time:  Friday August 16 2017 01:07:39 EDT Ventricular Rate:  49 PR Interval:    QRS Duration: 124 QT Interval:  477 QTC Calculation: 431 R Axis:   19 Text Interpretation:  Sinus bradycardia Ventricular premature complex Left bundle branch block No significant change since last tracing Confirmed by Duffy Bruce [phone]) on 08/16/2017 4:26:27 AM   Radiology Dg Chest 2 View  Result Date: 08/16/2017 CLINICAL DATA:  72 y/o  F; fall with head injury. EXAM: CHEST - 2 VIEW COMPARISON:  02/18/2018 chest radiograph. FINDINGS: Stable cardiomegaly given projection and technique. Aortic atherosclerosis with calcification. Pulmonary vascular congestion. No focal consolidation. No pleural effusion or pneumothorax. No acute osseous abnormality is evident. Right upper quadrant surgical clips, presumably cholecystectomy. IMPRESSION: Cardiomegaly and pulmonary vascular congestion. No focal consolidation. Electronically Signed   By: Kristine Garbe M.D.   On: 08/16/2017 01:58   Ct Head Wo Contrast  Result Date: 08/16/2017 CLINICAL DATA:  Patient fell and hit head. EXAM: CT HEAD WITHOUT CONTRAST CT CERVICAL SPINE WITHOUT CONTRAST TECHNIQUE: Multidetector CT imaging of the head and cervical spine was performed following the standard protocol without intravenous contrast. Multiplanar CT image reconstructions  of the cervical spine were also generated. COMPARISON:  09/14/2015 FINDINGS: CT HEAD FINDINGS Brain: Diffuse cerebral atrophy. Ventricular dilatation consistent with central  atrophy. Low-attenuation changes in the deep white matter consistent small vessel ischemia. Old encephalomalacia in the left occipital lobe consistent with old infarct. No change since prior study. No mass-effect or midline shift. No abnormal extra-axial fluid collections. Gray-white matter junctions are distinct. Basal cisterns are not effaced. No acute intracranial hemorrhage. Vascular: Intracranial arterial vascular calcifications are present. Skull: Calvarium appears intact. No acute depressed skull fractures. Sinuses/Orbits: Mucosal thickening throughout the paranasal sinuses. No acute air-fluid levels. Mastoid air cells are not opacified. Other: Small subcutaneous scalp hematoma over the right anterior frontal region. CT CERVICAL SPINE FINDINGS Alignment: Normal. Skull base and vertebrae: Skull base appears intact. No vertebral compression deformities. No focal bone lesion or bone destruction. Chronic appearing limbus vertebra at the inferior endplate of C5. Soft tissues and spinal canal: No prevertebral soft tissue swelling. No paraspinal soft tissue mass or infiltration. Disc levels: Degenerative changes with disc space narrowing and endplate hypertrophic changes most prominent at C4-5, C5-6, and C6-7 levels. Degenerative changes in the facet joints. Uncovertebral spurring prominent at C5-6. Upper chest: Vascular calcifications.  Lung apices are clear. Other: None. IMPRESSION: 1. No acute intracranial abnormalities. Chronic atrophy and small vessel ischemic changes. Old infarct in the left posterior occipital region. 2. Normal alignment of the cervical spine. Mild degenerative changes. No acute displaced fractures identified. Electronically Signed   By: Lucienne Capers M.D.   On: 08/16/2017 02:01   Ct Cervical Spine Wo  Contrast  Result Date: 08/16/2017 CLINICAL DATA:  Patient fell and hit head. EXAM: CT HEAD WITHOUT CONTRAST CT CERVICAL SPINE WITHOUT CONTRAST TECHNIQUE: Multidetector CT imaging of the head and cervical spine was performed following the standard protocol without intravenous contrast. Multiplanar CT image reconstructions of the cervical spine were also generated. COMPARISON:  09/14/2015 FINDINGS: CT HEAD FINDINGS Brain: Diffuse cerebral atrophy. Ventricular dilatation consistent with central atrophy. Low-attenuation changes in the deep white matter consistent small vessel ischemia. Old encephalomalacia in the left occipital lobe consistent with old infarct. No change since prior study. No mass-effect or midline shift. No abnormal extra-axial fluid collections. Gray-white matter junctions are distinct. Basal cisterns are not effaced. No acute intracranial hemorrhage. Vascular: Intracranial arterial vascular calcifications are present. Skull: Calvarium appears intact. No acute depressed skull fractures. Sinuses/Orbits: Mucosal thickening throughout the paranasal sinuses. No acute air-fluid levels. Mastoid air cells are not opacified. Other: Small subcutaneous scalp hematoma over the right anterior frontal region. CT CERVICAL SPINE FINDINGS Alignment: Normal. Skull base and vertebrae: Skull base appears intact. No vertebral compression deformities. No focal bone lesion or bone destruction. Chronic appearing limbus vertebra at the inferior endplate of C5. Soft tissues and spinal canal: No prevertebral soft tissue swelling. No paraspinal soft tissue mass or infiltration. Disc levels: Degenerative changes with disc space narrowing and endplate hypertrophic changes most prominent at C4-5, C5-6, and C6-7 levels. Degenerative changes in the facet joints. Uncovertebral spurring prominent at C5-6. Upper chest: Vascular calcifications.  Lung apices are clear. Other: None. IMPRESSION: 1. No acute intracranial abnormalities.  Chronic atrophy and small vessel ischemic changes. Old infarct in the left posterior occipital region. 2. Normal alignment of the cervical spine. Mild degenerative changes. No acute displaced fractures identified. Electronically Signed   By: Lucienne Capers M.D.   On: 08/16/2017 02:01    Procedures Procedures (including critical care time)  Medications Ordered in ED Medications  cefTRIAXone (ROCEPHIN) 1 g in sodium chloride 0.9 % 100 mL IVPB (0 g Intravenous Stopped 08/16/17 0426)  sodium chloride 0.9 % bolus 500 mL (0  mLs Intravenous Stopped 08/16/17 0441)     Initial Impression / Assessment and Plan / ED Course  I have reviewed the triage vital signs and the nursing notes.  Pertinent labs & imaging results that were available during my care of the patient were reviewed by me and considered in my medical decision making (see chart for details).     72 year old female here with mechanical fall.  CT imaging negative.  Patient incidentally with positive UTI and will treat as simple UTI with Rocephin dose here and Keflex as an outpatient.  Patient has normal white blood cell count, no anion gap or evidence to suggest sepsis.  Her heart rate is in the 40s to 60s which is her baseline per review of records and prior EKGs.  Blood pressure is 90s over 50s at baseline and is at her baseline here.  She did get a very small amount of fluid.  Will discharge with outpatient follow-up and fall precautions.  Final Clinical Impressions(s) / ED Diagnoses   Final diagnoses:  Contusion of face, initial encounter  Acute cystitis without hematuria    ED Discharge Orders        Ordered    cephALEXin (KEFLEX) 500 MG capsule  3 times daily     08/16/17 0426       Duffy Bruce, MD 08/16/17 [phone]

## 2017-08-16 NOTE — ED Notes (Signed)
Patient transported to CT 

## 2017-08-16 NOTE — ED Triage Notes (Signed)
Pt to ED from facility. Staff sts moving patient to bathroom when pt fell asleep. Staff unable to stop fall, pt hit head. Staff also reports mentating at baseline for bedtime. Pt very sedate on arrival, nonverbal. Hx of dementia. GCS 7.

## 2017-08-18 LAB — URINE CULTURE
Culture: >=100000 — AB
SPECIAL REQUESTS: NORMAL

## 2017-08-19 ENCOUNTER — Telehealth: Payer: Self-pay | Admitting: Emergency Medicine

## 2017-08-19 NOTE — Telephone Encounter (Signed)
Post ED Visit - Positive Culture Follow-up  Culture report reviewed by antimicrobial stewardship pharmacist:  []  Elenor Quinones, Pharm.D. []  Heide Guile, Pharm.D., BCPS AQ-ID []  Parks Neptune, Pharm.D., BCPS []  Alycia Rossetti, Pharm.D., BCPS []  [place], [place].D., BCPS, AAHIVP []  Legrand Como, Pharm.D., BCPS, AAHIVP []  Salome Arnt, PharmD, BCPS []  Wynell Balloon, PharmD []  Vincenza Hews, PharmD, BCPS [facility] PharmD  Positive urine culture Treated with cephalexin, organism sensitive to the same and no further patient follow-up is required at this time.  Hazle Nordmann 08/19/2017, 1:04 PM

## 2017-08-22 DIAGNOSIS — R32 Unspecified urinary incontinence: Secondary | ICD-10-CM | POA: Diagnosis not present

## 2017-08-27 DIAGNOSIS — F0281 Dementia in other diseases classified elsewhere with behavioral disturbance: Secondary | ICD-10-CM | POA: Diagnosis not present

## 2017-08-27 DIAGNOSIS — R69 Illness, unspecified: Secondary | ICD-10-CM | POA: Diagnosis not present

## 2017-08-27 DIAGNOSIS — F321 Major depressive disorder, single episode, moderate: Secondary | ICD-10-CM | POA: Diagnosis not present

## 2017-08-27 DIAGNOSIS — G309 Alzheimer's disease, unspecified: Secondary | ICD-10-CM | POA: Diagnosis not present

## 2017-09-09 DIAGNOSIS — R69 Illness, unspecified: Secondary | ICD-10-CM | POA: Diagnosis not present

## 2017-09-09 DIAGNOSIS — J069 Acute upper respiratory infection, unspecified: Secondary | ICD-10-CM | POA: Diagnosis not present

## 2017-09-09 DIAGNOSIS — L309 Dermatitis, unspecified: Secondary | ICD-10-CM | POA: Diagnosis not present

## 2017-09-09 DIAGNOSIS — G300 Alzheimer's disease with early onset: Secondary | ICD-10-CM | POA: Diagnosis not present

## 2017-09-24 DIAGNOSIS — R32 Unspecified urinary incontinence: Secondary | ICD-10-CM | POA: Diagnosis not present

## 2017-10-15 DIAGNOSIS — G309 Alzheimer's disease, unspecified: Secondary | ICD-10-CM | POA: Diagnosis not present

## 2017-10-15 DIAGNOSIS — F321 Major depressive disorder, single episode, moderate: Secondary | ICD-10-CM | POA: Diagnosis not present

## 2017-10-15 DIAGNOSIS — R69 Illness, unspecified: Secondary | ICD-10-CM | POA: Diagnosis not present

## 2017-10-15 DIAGNOSIS — F0281 Dementia in other diseases classified elsewhere with behavioral disturbance: Secondary | ICD-10-CM | POA: Diagnosis not present

## 2017-10-16 ENCOUNTER — Encounter (HOSPITAL_COMMUNITY): Payer: Self-pay

## 2017-10-16 ENCOUNTER — Inpatient Hospital Stay (HOSPITAL_COMMUNITY)
Admission: EM | Admit: 2017-10-16 | Discharge: 2017-10-24 | DRG: 291 | Disposition: A | Discharge status: Skilled Nursing Facility | Payer: Medicare HMO | Source: Skilled Nursing Facility | Attending: Family Medicine | Admitting: Family Medicine

## 2017-10-16 ENCOUNTER — Emergency Department (HOSPITAL_COMMUNITY): Payer: Medicare HMO

## 2017-10-16 ENCOUNTER — Other Ambulatory Visit: Payer: Self-pay

## 2017-10-16 DIAGNOSIS — Z6836 Body mass index (BMI) 36.0-36.9, adult: Secondary | ICD-10-CM

## 2017-10-16 DIAGNOSIS — R69 Illness, unspecified: Secondary | ICD-10-CM | POA: Diagnosis not present

## 2017-10-16 DIAGNOSIS — E785 Hyperlipidemia, unspecified: Secondary | ICD-10-CM | POA: Diagnosis present

## 2017-10-16 DIAGNOSIS — Z7902 Long term (current) use of antithrombotics/antiplatelets: Secondary | ICD-10-CM

## 2017-10-16 DIAGNOSIS — N183 Chronic kidney disease, stage 3 unspecified: Secondary | ICD-10-CM | POA: Diagnosis present

## 2017-10-16 DIAGNOSIS — Z79890 Hormone replacement therapy (postmenopausal): Secondary | ICD-10-CM

## 2017-10-16 DIAGNOSIS — R9431 Abnormal electrocardiogram [ECG] [EKG]: Secondary | ICD-10-CM | POA: Diagnosis present

## 2017-10-16 DIAGNOSIS — I509 Heart failure, unspecified: Secondary | ICD-10-CM | POA: Diagnosis not present

## 2017-10-16 DIAGNOSIS — Z955 Presence of coronary angioplasty implant and graft: Secondary | ICD-10-CM

## 2017-10-16 DIAGNOSIS — I5043 Acute on chronic combined systolic (congestive) and diastolic (congestive) heart failure: Secondary | ICD-10-CM | POA: Diagnosis not present

## 2017-10-16 DIAGNOSIS — I2510 Atherosclerotic heart disease of native coronary artery without angina pectoris: Secondary | ICD-10-CM | POA: Diagnosis not present

## 2017-10-16 DIAGNOSIS — N390 Urinary tract infection, site not specified: Secondary | ICD-10-CM | POA: Diagnosis not present

## 2017-10-16 DIAGNOSIS — R0603 Acute respiratory distress: Secondary | ICD-10-CM | POA: Diagnosis present

## 2017-10-16 DIAGNOSIS — I2583 Coronary atherosclerosis due to lipid rich plaque: Secondary | ICD-10-CM | POA: Diagnosis present

## 2017-10-16 DIAGNOSIS — I5023 Acute on chronic systolic (congestive) heart failure: Secondary | ICD-10-CM | POA: Diagnosis not present

## 2017-10-16 DIAGNOSIS — I130 Hypertensive heart and chronic kidney disease with heart failure and stage 1 through stage 4 chronic kidney disease, or unspecified chronic kidney disease: Secondary | ICD-10-CM | POA: Diagnosis not present

## 2017-10-16 DIAGNOSIS — I10 Essential (primary) hypertension: Secondary | ICD-10-CM

## 2017-10-16 DIAGNOSIS — R404 Transient alteration of awareness: Secondary | ICD-10-CM | POA: Diagnosis not present

## 2017-10-16 DIAGNOSIS — J80 Acute respiratory distress syndrome: Secondary | ICD-10-CM | POA: Diagnosis not present

## 2017-10-16 DIAGNOSIS — Z8249 Family history of ischemic heart disease and other diseases of the circulatory system: Secondary | ICD-10-CM

## 2017-10-16 DIAGNOSIS — R748 Abnormal levels of other serum enzymes: Secondary | ICD-10-CM | POA: Diagnosis not present

## 2017-10-16 DIAGNOSIS — Z87891 Personal history of nicotine dependence: Secondary | ICD-10-CM

## 2017-10-16 DIAGNOSIS — R0602 Shortness of breath: Secondary | ICD-10-CM | POA: Diagnosis not present

## 2017-10-16 DIAGNOSIS — I110 Hypertensive heart disease with heart failure: Secondary | ICD-10-CM | POA: Diagnosis not present

## 2017-10-16 DIAGNOSIS — I252 Old myocardial infarction: Secondary | ICD-10-CM

## 2017-10-16 DIAGNOSIS — Z79899 Other long term (current) drug therapy: Secondary | ICD-10-CM

## 2017-10-16 DIAGNOSIS — B9620 Unspecified Escherichia coli [E. coli] as the cause of diseases classified elsewhere: Secondary | ICD-10-CM | POA: Diagnosis present

## 2017-10-16 DIAGNOSIS — N3000 Acute cystitis without hematuria: Secondary | ICD-10-CM | POA: Diagnosis present

## 2017-10-16 DIAGNOSIS — I5033 Acute on chronic diastolic (congestive) heart failure: Secondary | ICD-10-CM | POA: Diagnosis not present

## 2017-10-16 DIAGNOSIS — I493 Ventricular premature depolarization: Secondary | ICD-10-CM | POA: Diagnosis present

## 2017-10-16 DIAGNOSIS — I255 Ischemic cardiomyopathy: Secondary | ICD-10-CM | POA: Diagnosis present

## 2017-10-16 DIAGNOSIS — E669 Obesity, unspecified: Secondary | ICD-10-CM | POA: Diagnosis present

## 2017-10-16 DIAGNOSIS — F0390 Unspecified dementia without behavioral disturbance: Secondary | ICD-10-CM | POA: Diagnosis present

## 2017-10-16 LAB — I-STAT TROPONIN, ED: TROPONIN I, POC: 0.05 ng/mL (ref 0.00–0.08)

## 2017-10-16 LAB — COMPREHENSIVE METABOLIC PANEL
ALT: 15 U/L (ref 0–44)
AST: 24 U/L (ref 15–41)
Albumin: 4 g/dL (ref 3.5–5.0)
Alkaline Phosphatase: 76 U/L (ref 38–126)
Anion gap: 12 (ref 5–15)
BUN: 26 mg/dL — ABNORMAL HIGH (ref 8–23)
CHLORIDE: 105 mmol/L (ref 98–111)
CO2: 24 mmol/L (ref 22–32)
CREATININE: 1.24 mg/dL — AB (ref 0.44–1.00)
Calcium: 10 mg/dL (ref 8.9–10.3)
GFR, EST AFRICAN AMERICAN: 49 mL/min — AB (ref >60)
GFR, EST NON AFRICAN AMERICAN: 42 mL/min — AB (ref >60)
Glucose, Bld: 86 mg/dL (ref 70–99)
POTASSIUM: 3.8 mmol/L (ref 3.5–5.1)
SODIUM: 141 mmol/L (ref 135–145)
Total Bilirubin: 1 mg/dL (ref 0.3–1.2)
Total Protein: 7.4 g/dL (ref 6.5–8.1)

## 2017-10-16 LAB — CBC WITH DIFFERENTIAL/PLATELET
Basophils Absolute: 0 10*3/uL (ref 0.0–0.1)
Basophils Relative: 0 %
EOS ABS: 0.1 10*3/uL (ref 0.0–0.7)
Eosinophils Relative: 1 %
HEMATOCRIT: 38.2 % (ref 36.0–46.0)
HEMOGLOBIN: 12.5 g/dL (ref 12.0–15.0)
LYMPHS ABS: 2.3 10*3/uL (ref 0.7–4.0)
LYMPHS PCT: 25 %
MCH: 29.5 pg (ref 26.0–34.0)
MCHC: 32.7 g/dL (ref 30.0–36.0)
MCV: 90.1 fL (ref 78.0–100.0)
MONOS PCT: 11 %
Monocytes Absolute: 1 10*3/uL (ref 0.1–1.0)
NEUTROS ABS: 5.7 10*3/uL (ref 1.7–7.7)
NEUTROS PCT: 63 %
Platelets: 228 10*3/uL (ref 150–400)
RBC: 4.24 MIL/uL (ref 3.87–5.11)
RDW: 15.1 % (ref 11.5–15.5)
WBC: 9.1 10*3/uL (ref 4.0–10.5)

## 2017-10-16 LAB — TROPONIN I: Troponin I: 0.08 ng/mL (ref <0.03)

## 2017-10-16 LAB — URINALYSIS, ROUTINE W REFLEX MICROSCOPIC
BILIRUBIN URINE: NEGATIVE
Glucose, UA: NEGATIVE mg/dL
HGB URINE DIPSTICK: NEGATIVE
KETONES UR: NEGATIVE mg/dL
NITRITE: POSITIVE — AB
Protein, ur: NEGATIVE mg/dL
SPECIFIC GRAVITY, URINE: 1.014 (ref 1.005–1.030)
pH: 6 (ref 5.0–8.0)

## 2017-10-16 LAB — BRAIN NATRIURETIC PEPTIDE: B Natriuretic Peptide: 244.4 pg/mL — ABNORMAL HIGH (ref 0.0–100.0)

## 2017-10-16 LAB — D-DIMER, QUANTITATIVE: D-Dimer, Quant: 0.54 ug/mL-FEU — ABNORMAL HIGH (ref 0.00–0.50)

## 2017-10-16 MED ORDER — FUROSEMIDE 10 MG/ML IJ SOLN
40.0000 mg | Freq: Once | INTRAMUSCULAR | Status: AC
  Administered 2017-10-16: 40 mg via INTRAVENOUS
  Filled 2017-10-16: qty 4

## 2017-10-16 MED ORDER — RISPERIDONE 1 MG/ML PO SOLN
1.0000 mg | Freq: Two times a day (BID) | ORAL | Status: DC
  Administered 2017-10-17 – 2017-10-24 (×16): 1 mg via ORAL
  Filled 2017-10-16 (×16): qty 1

## 2017-10-16 MED ORDER — MEMANTINE HCL 5 MG PO TABS
5.0000 mg | ORAL_TABLET | Freq: Two times a day (BID) | ORAL | Status: DC
  Administered 2017-10-17 – 2017-10-24 (×16): 5 mg via ORAL
  Filled 2017-10-16 (×16): qty 1

## 2017-10-16 MED ORDER — SODIUM CHLORIDE 0.9% FLUSH
3.0000 mL | Freq: Two times a day (BID) | INTRAVENOUS | Status: DC
  Administered 2017-10-17 – 2017-10-24 (×11): 3 mL via INTRAVENOUS

## 2017-10-16 MED ORDER — FLUOXETINE HCL 20 MG PO CAPS
40.0000 mg | ORAL_CAPSULE | Freq: Every day | ORAL | Status: DC
  Administered 2017-10-17 – 2017-10-24 (×8): 40 mg via ORAL
  Filled 2017-10-16 (×8): qty 2

## 2017-10-16 MED ORDER — ONDANSETRON HCL 4 MG/2ML IJ SOLN: 4.0000 mg | Freq: Four times a day (QID) | INTRAMUSCULAR | Status: DC | PRN

## 2017-10-16 MED ORDER — FUROSEMIDE 10 MG/ML IJ SOLN
40.0000 mg | Freq: Every day | INTRAMUSCULAR | Status: DC
  Administered 2017-10-17: 40 mg via INTRAVENOUS
  Filled 2017-10-16: qty 4

## 2017-10-16 MED ORDER — ENOXAPARIN SODIUM 30 MG/0.3ML ~~LOC~~ SOLN
30.0000 mg | Freq: Every day | SUBCUTANEOUS | Status: DC
  Administered 2017-10-17: 30 mg via SUBCUTANEOUS
  Filled 2017-10-16: qty 0.3

## 2017-10-16 MED ORDER — SODIUM CHLORIDE 0.9% FLUSH
3.0000 mL | INTRAVENOUS | Status: DC | PRN
  Administered 2017-10-18: 3 mL via INTRAVENOUS
  Filled 2017-10-16: qty 3

## 2017-10-16 MED ORDER — ATORVASTATIN CALCIUM 40 MG PO TABS
80.0000 mg | ORAL_TABLET | Freq: Every day | ORAL | Status: DC
  Administered 2017-10-17 – 2017-10-23 (×8): 80 mg via ORAL
  Filled 2017-10-16 (×8): qty 2

## 2017-10-16 MED ORDER — SODIUM CHLORIDE 0.9 % IV SOLN
1.0000 g | INTRAVENOUS | Status: DC
  Administered 2017-10-17 (×2): 1 g via INTRAVENOUS
  Filled 2017-10-16: qty 1
  Filled 2017-10-16: qty 10
  Filled 2017-10-16: qty 1

## 2017-10-16 MED ORDER — ACETAMINOPHEN 325 MG PO TABS: 650.0000 mg | ORAL_TABLET | ORAL | Status: DC | PRN

## 2017-10-16 MED ORDER — SODIUM CHLORIDE 0.9 % IV SOLN
250.0000 mL | INTRAVENOUS | Status: DC | PRN
  Administered 2017-10-17 (×2): 250 mL via INTRAVENOUS

## 2017-10-16 MED ORDER — CLOPIDOGREL BISULFATE 75 MG PO TABS
75.0000 mg | ORAL_TABLET | Freq: Every day | ORAL | Status: DC
  Administered 2017-10-17 – 2017-10-24 (×8): 75 mg via ORAL
  Filled 2017-10-16 (×8): qty 1

## 2017-10-16 NOTE — ED Triage Notes (Signed)
Pt BIBA from [facility]. Upon EMS arrival, pt had no complaints, VSS. Nursing home staff states pt was [facility], but not noted by EMS. Pt is A&O to baseline.

## 2017-10-16 NOTE — H&P (Signed)
Kaitlyn Simmons:[id number] DOB: 1945-08-21 DOA: 10/16/2017     PCP: Arlis Porta., MD   Outpatient Specialists:   CARDS:   Dr.Mc Fredderick Erb Patient arrived to ER on 10/16/17 at 1621  Patient coming from:   From facility [facility].  Chief Complaint:  Chief Complaint  Patient presents with  . Shortness of Breath    HPI: Kaitlyn Simmons is a 72 y.o. female with medical history significant of  dementia, coronary artery disease s/p PCI last in 2012, CHF EF 45%, and CKD III     Presented with   reported shortness of breath by nursing home staff although patient herself has dementia pleasantly confused and unable to provide history.  On arrival by EMS patient was no longer short of breath not endorsing any chest pain.  No recent falls.  No fevers or chills no cough Recent admission in May 2018 for rectal bleeding Regarding pertinent Chronic problems: Regarding chronic systolic/diastolic CHF and ischemic cardiomyopathy patient had a ST elevation MI in 2011 and again 2012 with cardiogenic shock in July 2012 status post BMS to mid RCA in 2011; BMS to mid LAD in July 2012  Last echo -11/23/2011 echo EF 40-45%, grade 1 DD, moderate HK mid-distal inferior myocardium  She has known history of dementia on Namenda risperidone Exelon on her baseline she is pleasantly confused but still able to complete ADLs   While in ER: Noted to have slightly elevated creatinine 1.2 from baseline of 1 BNP 244 UA showing evidence of UTI EKG showed new T wave inversions in leads V1 through V3 although patient denied any chest pain physical exam was worrisome for fluid overload troponin unremarkable chest x-ray showed mild pulmonary congestion patient was given 40 mg of Lasix  The following Work up has been ordered so far:  Orders Placed This Encounter  Procedures  . DG Chest Port 1 View  . Comprehensive metabolic panel  . CBC WITH DIFFERENTIAL  . Brain natriuretic peptide    . Urinalysis, Routine w reflex microscopic  . Initiate Carrier Fluid Protocol  . Consult to hospitalist  . Inpatient consult to Social Work  . I-stat troponin, ED  (not at [facility], [facility])  . ED EKG  . EKG 12-Lead  . Insert peripheral IV      Following Medications were ordered in ER: Medications  furosemide (LASIX) injection 40 mg (40 mg Intravenous Given 10/16/17 1826)    Significant initial  Findings: Abnormal Labs Reviewed  COMPREHENSIVE METABOLIC PANEL - Abnormal; Notable for the following components:      Result Value   BUN 26 (*)    Creatinine, Ser 1.24 (*)    GFR calc non Af Amer 42 (*)    GFR calc Af Amer 49 (*)    All other components within normal limits  BRAIN NATRIURETIC PEPTIDE - Abnormal; Notable for the following components:   B Natriuretic Peptide 244.4 (*)    All other components within normal limits  URINALYSIS, ROUTINE W REFLEX MICROSCOPIC - Abnormal; Notable for the following components:   APPearance HAZY (*)    Nitrite POSITIVE (*)    Leukocytes, UA LARGE (*)    Bacteria, UA MANY (*)    All other components within normal limits     Na 141 K 3.8  Cr    Up from baseline see below Lab Results  Component Value Date   CREATININE 1.24 (H) 10/16/2017   CREATININE 1.19 (  H) 08/16/2017   CREATININE 1.09 (H) 07/09/2016      WBC 9.1  HG/HCT stable,       Component Value Date/Time   HGB 12.5 10/16/2017 1720   HGB 12.6 10/04/2014 0922   HCT 38.2 10/16/2017 1720   HCT 37.2 10/04/2014 0922     Troponin (Point of Care Test) Recent Labs    10/16/17 1724  TROPIPOC 0.05    BNP (last 3 results) Recent Labs    10/16/17 1720  BNP 244.4*    ProBNP (last 3 results) No results for input(s): PROBNP in the last 8760 hours.  Lactic Acid, Venous No results found for: LATICACIDVEN    UA  evidence of UTI        CXR -cardiomegaly NON acute   ECG:  Personally reviewed by me showing: HR : 71 Rhythm:  NSR,   New T wave inversions in lateral  leads QTC 461     ED Triage Vitals  Enc Vitals Group     BP 10/16/17 1639 103/80     Pulse Rate 10/16/17 1639 62     Resp 10/16/17 1639 20     Temp --      Temp src --      SpO2 10/16/17 1629 99 %     Weight 10/16/17 1636 216 lb 11.4 oz (98.3 kg)     Height 10/16/17 1636 _0  (1.727 m)     Head Circumference --      Peak Flow --      Pain Score 10/16/17 1636 0     Pain Loc --      Pain Edu? --      Excl. in [place]? --   TMAX(24)@       Latest  Blood pressure 114/67, pulse (!) 57, resp. rate (!) 24, height _1  (1.727 m), weight 98.3 kg, SpO2 99 %.      Hospitalist was called for admission for CHF exacerbation   Review of Systems:    Pertinent positives include: shortness of breath at rest.  Constitutional:  No weight loss, night sweats, Fevers, chills, fatigue, weight loss  HEENT:  No headaches, Difficulty swallowing,Tooth/dental problems,Sore throat,  No sneezing, itching, ear ache, nasal congestion, post nasal drip,  Cardio-vascular:  No chest pain, Orthopnea, PND, anasarca, dizziness, palpitations.no Bilateral lower extremity swelling  GI:  No heartburn, indigestion, abdominal pain, nausea, vomiting, diarrhea, change in bowel habits, loss of appetite, melena, blood in stool, hematemesis Resp:  no  No dyspnea on exertion, No excess mucus, no productive cough, No non-productive cough, No coughing up of blood.No change in color of mucus.No wheezing. Skin:  no rash or lesions. No jaundice GU:  no dysuria, change in color of urine, no urgency or frequency. No straining to urinate.  No flank pain.  Musculoskeletal:  No joint pain or no joint swelling. No decreased range of motion. No back pain.  Psych:  No change in mood or affect. No depression or anxiety. No memory loss.  Neuro: no localizing neurological complaints, no tingling, no weakness, no double vision, no gait abnormality, no slurred speech, no confusion  All systems reviewed and apart from [place] all are  negative  Past Medical History:   Past Medical History:  Diagnosis Date  . CAD (coronary artery disease)    a. s/p [place] STEMI 7/11 => tx with BMS to RCA (tx at [facility]);  b.  AL STEMI 7/12 => LHC: mRCA 100% ==> PCI with BMS to RCA; LM  10%, pCFX 50%, OM1 10%, pLAD 50%, mLAD 99%, 70%  ==>  c. staged PCI: BMS to  LAD (tx at [facility])  . Cataracts, bilateral   . Chronic systolic heart failure ([place])   . Dementia   . GERD (gastroesophageal reflux disease)   . HTN (hypertension)   . Hyperlipidemia   . Ischemic cardiomyopathy    a. EF 30-35% at time of AL STEMI 7/12;   b. IMPROVED by f/u Echo 9/13:  mild LVH, EF 45-50%, mid to dist Inf, AS, Apical HK, Gr 1 diast dysfn, MAC, mod LAE  . STEMI (ST elevation myocardial infarction) ([place]) 2011; 2012   [place]; [place]      Past Surgical History:  Procedure Laterality Date  . CATARACT EXTRACTION W/ INTRAOCULAR LENS IMPLANT Left ~ 2008  . CHOLECYSTECTOMY N/A 10/07/2012   Procedure: LAPAROSCOPIC CHOLECYSTECTOMY WITH INTRAOPERATIVE CHOLANGIOGRAM;  Surgeon: Shann Medal, MD;  Location: [facility]S;  Service: General;  Laterality: N/A;  . CORONARY ANGIOPLASTY WITH STENT PLACEMENT  2011; 2012   RCA; RCA & LAD  . DILATION AND CURETTAGE OF UTERUS  1970's  . ERCP N/A 10/08/2012   Procedure: ENDOSCOPIC RETROGRADE CHOLANGIOPANCREATOGRAPHY (ERCP);  Surgeon: Gatha Mayer, MD;  Location: [facility];  Service: Endoscopy;  Laterality: N/A;  . ERCP W/ SPHICTEROTOMY  11/10/2012   stones removed [place]  . TONSILLECTOMY AND ADENOIDECTOMY  1980's    Social History:  Ambulatory   walker       reports that she quit smoking about 14 years ago. Her smoking use included cigarettes. She has a 60.00 pack-year smoking history. She has never used smokeless tobacco. She reports that she does not drink alcohol or use drugs.     Family History:   Family History  Problem Relation Age of Onset  . Heart attack Father        DECEASED  . Stroke Father        DECEASED  . Leukemia Mother         DECEASED  . Heart attack Sister        ALIVE  . Hypertension Sister     Allergies: No Known Allergies   Prior to Admission medications   Medication Sig Start Date End Date Taking? Authorizing Provider  acetaminophen (TYLENOL) 500 MG tablet Take 500 mg by mouth every 4 (four) hours as needed for moderate pain or headache.    [provider]  alum & mag hydroxide-simeth ([place]) 200-200-20 MG/5ML suspension Take 30 mLs by mouth every 6 (six) hours as needed for indigestion or heartburn.    [provider]  atorvastatin (LIPITOR) 80 MG tablet Take 1 tablet (80 mg total) by mouth at bedtime. 06/27/15   Carrie Mew, MD  clopidogrel (PLAVIX) 75 MG tablet Take 1 tablet (75 mg total) by mouth daily. 07/14/16   Orson Eva, MD  FLUoxetine (PROZAC) 10 MG capsule Take 40 mg by mouth daily.     [provider]  furosemide (LASIX) 40 MG tablet Take 1 tablet (40 mg total) by mouth every morning. 06/27/15   Carrie Mew, MD  guaifenesin (ROBITUSSIN) 100 MG/5ML syrup Take 200 mg by mouth every 6 (six) hours as needed for cough.     [provider]  lisinopril (ZESTRIL) 2.5 MG tablet Take 1 tablet (2.5 mg total) by mouth daily. 06/27/15   Carrie Mew, MD  magnesium hydroxide (MILK OF MAGNESIA) 400 MG/5ML suspension Take 30 mLs by mouth at bedtime as needed for moderate constipation.  [provider]  Melatonin 5 MG TABS Take 5 mg by mouth at bedtime.    [provider]  memantine (NAMENDA) 5 MG tablet Take 5 mg by mouth 2 (two) times daily.    [provider]  Multiple Vitamins-Minerals (OCUVITE ADULT 50+) CAPS Take 1 capsule by mouth every morning.    [provider]  Neomycin-Bacitracin-Polymyxin (TRIPLE ANTIBIOTIC) [phone] OINT Apply 1 application topically as needed (wound care).    [provider]  nitroGLYCERIN (NITROSTAT) 0.4 MG SL tablet Place 1 tablet (0.4 mg total) under the tongue every 5  (five) minutes as needed for chest pain. 06/27/15   Carrie Mew, MD  risperiDONE (RISPERDAL) 1 MG tablet Take 1 mg by mouth at bedtime.    [provider]  rivastigmine (EXELON) 1.5 MG capsule Take 1.5 mg by mouth 2 (two) times daily.    [provider]   Physical Exam: Blood pressure 114/67, pulse (!) 57, resp. rate (!) 24, height _0  (1.727 m), weight 98.3 kg, SpO2 99 %. 1. General:  in No Acute distress   Chronically ill  -appearing 2. Psychological: Alert and   Oriented to self 3. Head/ENT:   Moist  Mucous Membranes                          Head Non traumatic, neck supple                           Poor Dentition 4. SKIN: normal Skin turgor,  Skin clean Dry and intact no rash 5. Heart: Regular rate and rhythm no Murmur, no Rub or gallop 6. Lungs:  no wheezes or crackles   7. Abdomen: Soft,  non-tender, Non distended bowel sounds present 8. Lower extremities: no clubbing, cyanosis, or edema 9. Neurologically Grossly intact, moving all 4 extremities equally   10. MSK: Normal range of motion   LABS:     Recent Labs  Lab 10/16/17 1720  WBC 9.1  NEUTROABS 5.7  HGB 12.5  HCT 38.2  MCV 90.1  PLT 761   Basic Metabolic Panel: Recent Labs  Lab 10/16/17 1720  NA 141  K 3.8  CL 105  CO2 24  GLUCOSE 86  BUN 26*  CREATININE 1.24*  CALCIUM 10.0      Recent Labs  Lab 10/16/17 1720  AST 24  ALT 15  ALKPHOS 76  BILITOT 1.0  PROT 7.4  ALBUMIN 4.0   No results for input(s): LIPASE, AMYLASE in the last 168 hours. No results for input(s): AMMONIA in the last 168 hours.    HbA1C: No results for input(s): HGBA1C in the last 72 hours. CBG: No results for input(s): GLUCAP in the last 168 hours.    Urine analysis:    Component Value Date/Time   COLORURINE YELLOW 10/16/2017 1646   APPEARANCEUR HAZY (A) 10/16/2017 1646   LABSPEC 1.014 10/16/2017 1646   PHURINE 6.0 10/16/2017 1646   GLUCOSEU NEGATIVE 10/16/2017 1646   HGBUR NEGATIVE 10/16/2017  1646   BILIRUBINUR NEGATIVE 10/16/2017 1646   KETONESUR NEGATIVE 10/16/2017 1646   PROTEINUR NEGATIVE 10/16/2017 1646   NITRITE POSITIVE (A) 10/16/2017 1646   LEUKOCYTESUR LARGE (A) 10/16/2017 1646       Cultures:    Component Value Date/Time   SDES URINE, CATHETERIZED 08/16/2017 0250   SPECREQUEST  08/16/2017 0250    Normal Performed at [facility], [place] [address],  [place], [place] [address]    CULT >=100,000 COLONIES/mL ESCHERICHIA COLI (A) 08/16/2017 0250   REPTSTATUS 08/18/2017 FINAL 08/16/2017 0250     Radiological Exams on Admission: Dg Chest Port 1 View  Result Date: 10/16/2017 CLINICAL DATA:  Shortness of breath EXAM: PORTABLE CHEST 1 VIEW COMPARISON:  August 16, 2017 FINDINGS: No edema or consolidation. There is cardiomegaly with pulmonary vascularity normal. There is aortic atherosclerosis. No adenopathy. No bone lesions. IMPRESSION: Cardiomegaly. No edema or consolidation. There is aortic atherosclerosis. Aortic Atherosclerosis (ICD10-I70.0). Electronically Signed   By: Lowella Grip III M.D.   On: 10/16/2017 17:10    Chart has been reviewed    Assessment/Plan   72 y.o. female with medical history significant of  dementia, coronary artery disease s/p PCI last in 2012, CHF EF 45%, and CKD III   Admitted for acute on chronic combined systolic diastolic CHF exacerbation  Present on Admission:  . Acute on chronic combined systolic and diastolic CHF (congestive heart failure) ([place]) -  - admit on telemetry,  cycle cardiac enzymes, Troponin   obtain serial ECG  to evaluate for ischemia as a cause of heart failure  monitor daily weight:  Filed Weights   10/16/17 1636  Weight: 98.3 kg   Last BNP BNP (last 3 results) Recent Labs    10/16/17 1720  BNP 244.4*       diurese with IV lasix and monitor orthostatics and creatinine to avoid over diuresis.  Order echogram to evaluate EF and valves  ACE/ARBi not  ordered given soft BP in ER pls reasses     . CKD (chronic kidney disease), stage III (HCC) - avoid nephrotoxic medications . Coronary artery disease due to lipid rich plaque - on Plavix and Lipitor . Essential hypertension - stable  . Acute respiratory distress in the setting of abnormal troponin and possibly mild CHF exacerbation could be ACS equivalent we will continue to cycle cardiac enzymes monitor on telemetry cardiology consult . Acute lower UTI - strong foul smelling urine, unable to provide any hx Will treat and await results of Urine culture . Abnormal ECG - will cycle Ce, monitor on tele, given elevated troponin will email cardiology patient unable to provide her own history does not appear to be in distress.  Given abnormal EKG and positive troponin will initiate heparin drip for now Data troponin could be in the setting of CHF exacerbation but ACS could not be completely ruled out Other plan as per orders.  DVT prophylaxis:    heparin  Code Status:  FULL CODE  Will need to confirm with Family in AM    Family Communication:   Family not at  Bedside    Disposition Plan:                             Back to current facility when stable                                                                  Social Work  consulted  Consults called: none  Admission status:  obs   Level of care     tele             Toy Baker 10/16/2017, 9:35 PM    [facility]  Pager [phone]   after 2 AM please page floor coverage PA If 7AM-7PM, please contact the day team taking care of the patient  [email or web address]  Password [email or web address]

## 2017-10-16 NOTE — ED Provider Notes (Signed)
[facility] DEPT Provider Note   CSN: [id number] Arrival date & time: 10/16/17  1621     History   Chief Complaint Chief Complaint  Patient presents with  . Shortness of Breath    HPI Kaitlyn Simmons is a 72 y.o. female.  Level 5 caveat for History limited due to dementia and difficulty answering questions. But patient says yes or no. History from EMS is that patient sent for SOB. Hx of HF, CAD. On plavix but no falls. Patient states her name and shakes yes for symptoms of SOB but denies chest pain, no abdominal pain.  The history is provided by the patient and the EMS personnel.  Shortness of Breath  This is a new problem. The current episode started 6 to 12 hours ago. The problem has been gradually worsening. Pertinent negatives include no fever, no cough, no sputum production and no abdominal pain. She has tried nothing for the symptoms. The treatment provided no relief. She has had prior hospitalizations. Associated medical issues include CAD. Associated medical issues comments: CHF, HTN.    Past Medical History:  Diagnosis Date  . CAD (coronary artery disease)    a. s/p IL STEMI 7/11 => tx with BMS to RCA (tx at [facility]);  b.  AL STEMI 7/12 => LHC: mRCA 100% ==> PCI with BMS to RCA; LM 10%, pCFX 50%, OM1 10%, pLAD 50%, mLAD 99%, 70%  ==>  c. staged PCI: BMS to  LAD (tx at [facility])  . Cataracts, bilateral   . Chronic systolic heart failure ([place])   . Dementia   . GERD (gastroesophageal reflux disease)   . HTN (hypertension)   . Hyperlipidemia   . Ischemic cardiomyopathy    a. EF 30-35% at time of AL STEMI 7/12;   b. IMPROVED by f/u Echo 9/13:  mild LVH, EF 45-50%, mid to dist Inf, AS, Apical HK, Gr 1 diast dysfn, MAC, mod LAE  . STEMI (ST elevation myocardial infarction) ([place]) 2011; 2012   [place]; [place]    Patient Active Problem List   Diagnosis Date Noted  . Abnormal ECG 10/16/2017  . Acute on chronic combined systolic and diastolic CHF (congestive  heart failure) ([place]) 10/16/2017  . Acute respiratory distress 10/16/2017  . Coronary artery disease involving native coronary artery of native heart without angina pectoris   . Lower GI bleed 07/08/2016  . CKD (chronic kidney disease), stage III ([place]) 07/08/2016  . Normocytic anemia 07/08/2016  . Chronic combined systolic and diastolic CHF (congestive heart failure) ([place]) 07/08/2016  . Rectal bleeding   . Hematochezia   . Acute blood loss anemia   . Alzheimer's dementia, late onset, with behavioral disturbance 06/21/2015  . Body mass index 40.0-44.9, adult ([place]) 12/17/2014  . Morbid obesity ([place]) 12/17/2014  . Depression 09/17/2014  . Calculus of bile duct without mention of cholecystitis or obstruction 10/08/2012  . Abnormal cholangiogram 10/08/2012  . Mucosal abnormality of duodenum 10/08/2012  . Coronary artery disease due to lipid rich plaque   . Chronic systolic CHF (congestive heart failure) ([place])   . Essential hypertension   . Hyperlipidemia   . Ischemic cardiomyopathy     Past Surgical History:  Procedure Laterality Date  . CATARACT EXTRACTION W/ INTRAOCULAR LENS IMPLANT Left ~ 2008  . CHOLECYSTECTOMY N/A 10/07/2012   Procedure: LAPAROSCOPIC CHOLECYSTECTOMY WITH INTRAOPERATIVE CHOLANGIOGRAM;  Surgeon: Shann Medal, MD;  Location: [facility] ORS;  Service: General;  Laterality: N/A;  . CORONARY ANGIOPLASTY WITH STENT PLACEMENT  2011;  2012   RCA; RCA & LAD  . DILATION AND CURETTAGE OF UTERUS  1970's  . ERCP N/A 10/08/2012   Procedure: ENDOSCOPIC RETROGRADE CHOLANGIOPANCREATOGRAPHY (ERCP);  Surgeon: Gatha Mayer, MD;  Location: [facility];  Service: Endoscopy;  Laterality: N/A;  . ERCP W/ SPHICTEROTOMY  11/10/2012   stones removed [place]  . TONSILLECTOMY AND ADENOIDECTOMY  1980's     OB History   None      Home Medications    Prior to Admission medications   Medication Sig Start Date End Date Taking? Authorizing Provider  acetaminophen (TYLENOL) 500 MG tablet Take 500  mg by mouth every 4 (four) hours as needed for moderate pain or headache.    [provider]  alum & mag hydroxide-simeth ([place]) 200-200-20 MG/5ML suspension Take 30 mLs by mouth every 6 (six) hours as needed for indigestion or heartburn.    [provider]  atorvastatin (LIPITOR) 80 MG tablet Take 1 tablet (80 mg total) by mouth at bedtime. 06/27/15   Carrie Mew, MD  clopidogrel (PLAVIX) 75 MG tablet Take 1 tablet (75 mg total) by mouth daily. 07/14/16   Orson Eva, MD  FLUoxetine (PROZAC) 10 MG capsule Take 40 mg by mouth daily.     [provider]  furosemide (LASIX) 40 MG tablet Take 1 tablet (40 mg total) by mouth every morning. 06/27/15   Carrie Mew, MD  guaifenesin (ROBITUSSIN) 100 MG/5ML syrup Take 200 mg by mouth every 6 (six) hours as needed for cough.     [provider]  lisinopril (ZESTRIL) 2.5 MG tablet Take 1 tablet (2.5 mg total) by mouth daily. 06/27/15   Carrie Mew, MD  magnesium hydroxide (MILK OF MAGNESIA) 400 MG/5ML suspension Take 30 mLs by mouth at bedtime as needed for moderate constipation.     [provider]  Melatonin 5 MG TABS Take 5 mg by mouth at bedtime.    [provider]  memantine (NAMENDA) 5 MG tablet Take 5 mg by mouth 2 (two) times daily.    [provider]  Multiple Vitamins-Minerals (OCUVITE ADULT 50+) CAPS Take 1 capsule by mouth every morning.    [provider]  Neomycin-Bacitracin-Polymyxin (TRIPLE ANTIBIOTIC) [id number] OINT Apply 1 application topically as needed (wound care).    [provider]  nitroGLYCERIN (NITROSTAT) 0.4 MG SL tablet Place 1 tablet (0.4 mg total) under the tongue every 5 (five) minutes as needed for chest pain. 06/27/15   Carrie Mew, MD  risperiDONE (RISPERDAL) 1 MG tablet Take 1 mg by mouth at bedtime.    [provider]  rivastigmine (EXELON) 1.5 MG capsule Take 1.5 mg by mouth 2 (two) times daily.    [provider]    Family History Family History  Problem Relation Age of Onset  . Heart attack Father        DECEASED  . Stroke Father        DECEASED  . Leukemia Mother        DECEASED  . Heart attack Sister        ALIVE  . Hypertension Sister     Social History Social History   Tobacco Use  . Smoking status: Former Smoker    Packs/day: 2.00    Years: 30.00    Pack years: 60.00    Types: Cigarettes    Last attempt to quit: 10/25/2002    Years since quitting: 14.9  . Smokeless tobacco: Never Used  Substance Use Topics  . Alcohol  use: No  . Drug use: No     Allergies   Patient has no known allergies.   Review of Systems Review of Systems  Unable to perform ROS: Dementia  Constitutional: Negative for fever.  Respiratory: Positive for shortness of breath. Negative for cough and sputum production.   Gastrointestinal: Negative for abdominal pain.  Endocrine: Negative for cold intolerance.     Physical Exam Updated Vital Signs  ED Triage Vitals  Enc Vitals Group     BP 10/16/17 1639 103/80     Pulse Rate 10/16/17 1639 62     Resp 10/16/17 1639 20     Temp --      Temp src --      SpO2 10/16/17 1629 99 %     Weight 10/16/17 1636 216 lb 11.4 oz (98.3 kg)     Height 10/16/17 1636 _0  (1.727 m)     Head Circumference --      Peak Flow --      Pain Score 10/16/17 1636 0     Pain Loc --      Pain Edu? --      Excl. in [place]? --     Physical Exam  Constitutional: She appears well-developed and well-nourished. She appears distressed.  HENT:  Head: Normocephalic and atraumatic.  Mouth/Throat: No oropharyngeal exudate.  Eyes: Conjunctivae are normal.  Neck: Normal range of motion. Neck supple.  Cardiovascular: Normal rate and regular rhythm.  No murmur heard. Pulmonary/Chest: Tachypnea noted. No respiratory distress. She has decreased breath sounds.  Abdominal: Soft. There is no tenderness.  Musculoskeletal:       Right lower leg: She exhibits edema.        Left lower leg: She exhibits edema.  Neurological: She is alert.  Skin: Skin is warm and dry.  Psychiatric: She has a normal mood and affect.  Nursing note and vitals reviewed.    ED Treatments / Results  Labs (all labs ordered are listed, but only abnormal results are displayed) Labs Reviewed  COMPREHENSIVE METABOLIC PANEL - Abnormal; Notable for the following components:      Result Value   BUN 26 (*)    Creatinine, Ser 1.24 (*)    GFR calc non Af Amer 42 (*)    GFR calc Af Amer 49 (*)    All other components within normal limits  BRAIN NATRIURETIC PEPTIDE - Abnormal; Notable for the following components:   B Natriuretic Peptide 244.4 (*)    All other components within normal limits  URINALYSIS, ROUTINE W REFLEX MICROSCOPIC - Abnormal; Notable for the following components:   APPearance HAZY (*)    Nitrite POSITIVE (*)    Leukocytes, UA LARGE (*)    Bacteria, UA MANY (*)    All other components within normal limits  CBC WITH DIFFERENTIAL/PLATELET  I-STAT TROPONIN, ED    EKG EKG Interpretation  Date/Time:  Wednesday October 16 2017 16:41:49 EDT Ventricular Rate:  71 PR Interval:    QRS Duration: 126 QT Interval:  457 QTC Calculation: 461 R Axis:   89 Text Interpretation:  Sinus rhythm Paired ventricular premature complexes Nonspecific intraventricular conduction delay Anterior infarct, old Confirmed by Lennice Sites [id number]) on 10/16/2017 6:22:28 PM   Radiology Dg Chest Port 1 View  Result Date: 10/16/2017 CLINICAL DATA:  Shortness of breath EXAM: PORTABLE CHEST 1 VIEW COMPARISON:  August 16, 2017 FINDINGS: No edema or consolidation. There is cardiomegaly with pulmonary vascularity normal. There is aortic atherosclerosis. No adenopathy. No  bone lesions. IMPRESSION: Cardiomegaly. No edema or consolidation. There is aortic atherosclerosis. Aortic Atherosclerosis (ICD10-I70.0). Electronically Signed   By: Lowella Grip III M.D.   On: 10/16/2017 17:10     Procedures Procedures (including critical care time)  Medications Ordered in ED Medications  furosemide (LASIX) injection 40 mg (40 mg Intravenous Given 10/16/17 1826)     Initial Impression / Assessment and Plan / ED Course  I have reviewed the triage vital signs and the nursing notes.  Pertinent labs & imaging results that were available during my care of the patient were reviewed by me and considered in my medical decision making (see chart for details).     Kaitlyn Simmons is a 72 year old female history of CAD, congestive heart failure, dementia who presents to the ED with shortness of breath.  Patient with unremarkable vitals.  No fever.  EKG shows new T wave inversions in the V1 through V3 leads when compared to prior but no ST changes.  Patient denies chest pain.  History is overall difficult secondary dementia but patient is able to say yes and no.  She denies any abdominal pain.  On exam she has rales, peripheral edema.  Concern for heart failure versus pneumonia versus ACS.  Troponin within normal limits.  BNP elevated.  Chest x-ray with no signs of pneumonia, pneumothorax but some pulmonary congestion.  Patient otherwise with stable creatinine.  Electrolytes with no acute findings.  No anemia, no leukocytosis.  Patient appears to have heart failure exacerbation.  Given IV Lasix.  Patient has not had an echocardiogram in several years and to be admitted for further diuresis and further care with medicine.  Final Clinical Impressions(s) / ED Diagnoses   Final diagnoses:  Acute on chronic congestive heart failure, unspecified heart failure type [facility])    ED Discharge Orders    None       Lennice Sites, DO 10/16/17 1915

## 2017-10-16 NOTE — ED Notes (Signed)
Bed: GB15 Expected date:  Expected time:  Means of arrival:  Comments: EMS/shob/resolved

## 2017-10-16 NOTE — ED Notes (Addendum)
Pt's son-Steve Raju-called to get report on pt.  His mobile number is [phone] for any updates.

## 2017-10-16 NOTE — ED Notes (Signed)
ED TO INPATIENT HANDOFF REPORT  Name/Age/Gender Geri Seminole 72 y.o. female  Code Status Code Status History    Date Active Date Inactive Code Status Order ID Comments User Context   07/08/2016 0440 07/10/2016 1950 Full Code [id number]  Edwin Dada, MD Inpatient      Home/SNF/Other [place]  Chief Complaint SOB  Level of Care/Admitting Diagnosis ED Disposition    ED Disposition Condition Comment   Admit  The patient appears reasonably stabilized for admission considering the current resources, flow, and capabilities available in the ED at this time, and I doubt any other [facility] requiring further screening and/or treatment in the ED prior to admission is  present.       Medical History Past Medical History:  Diagnosis Date  . CAD (coronary artery disease)    a. s/p IL STEMI 7/11 => tx with BMS to RCA (tx at [facility]);  b.  AL STEMI 7/12 => LHC: mRCA 100% ==> PCI with BMS to RCA; LM 10%, pCFX 50%, OM1 10%, pLAD 50%, mLAD 99%, 70%  ==>  c. staged PCI: BMS to  LAD (tx at [facility])  . Cataracts, bilateral   . Chronic systolic heart failure ([place])   . Dementia   . GERD (gastroesophageal reflux disease)   . HTN (hypertension)   . Hyperlipidemia   . Ischemic cardiomyopathy    a. EF 30-35% at time of AL STEMI 7/12;   b. IMPROVED by f/u Echo 9/13:  mild LVH, EF 45-50%, mid to dist Inf, AS, Apical HK, Gr 1 diast dysfn, MAC, mod LAE  . STEMI (ST elevation myocardial infarction) ([place]) 2011; 2012   [place]; [place]    Allergies No Known Allergies  IV Location/Drains/Wounds Patient Lines/Drains/Airways Status   Active Line/Drains/Airways    Name:   Placement date:   Placement time:   Site:   Days:   Peripheral IV 10/16/17 Left Antecubital   10/16/17    1719    Antecubital   less than 1   External Urinary Catheter   10/16/17    1720    -   less than 1   Incision 10/07/12 Abdomen   10/07/12    1253     1835   Incision - 5 Ports Abdomen 1: Right;Lower 2: Right;Upper 3:  Medial 4: Left;Upper 5: Left;Lower   10/07/12    1334     1835          Labs/Imaging Results for orders placed or performed during the hospital encounter of 10/16/17 (from the past 48 hour(s))  Comprehensive metabolic panel     Status: Abnormal   Collection Time: 10/16/17  5:20 PM  Result Value Ref Range   Sodium 141 135 - 145 mmol/L   Potassium 3.8 3.5 - 5.1 mmol/L   Chloride 105 98 - 111 mmol/L   CO2 24 22 - 32 mmol/L   Glucose, Bld 86 70 - 99 mg/dL   BUN 26 (H) 8 - 23 mg/dL   Creatinine, Ser 1.24 (H) 0.44 - 1.00 mg/dL   Calcium 10.0 8.9 - 10.3 mg/dL   Total Protein 7.4 6.5 - 8.1 g/dL   Albumin 4.0 3.5 - 5.0 g/dL   AST 24 15 - 41 U/L   ALT 15 0 - 44 U/L   Alkaline Phosphatase 76 38 - 126 U/L   Total Bilirubin 1.0 0.3 - 1.2 mg/dL   GFR calc non Af Amer 42 (L) >60 mL/min   GFR calc Af  Amer 49 (L) >60 mL/min    Comment: (NOTE) The eGFR has been calculated using the CKD EPI equation. This calculation has not been validated in all clinical situations. eGFR's persistently <60 mL/min signify possible Chronic Kidney Disease.    Anion gap 12 5 - 15    Comment: Performed at [facility], [place] [address], [place], [place] [address]  CBC WITH DIFFERENTIAL     Status: None   Collection Time: 10/16/17  5:20 PM  Result Value Ref Range   WBC 9.1 4.0 - 10.5 K/uL   RBC 4.24 3.87 - 5.11 MIL/uL   Hemoglobin 12.5 12.0 - 15.0 g/dL   HCT 38.2 36.0 - 46.0 %   MCV 90.1 78.0 - 100.0 fL   MCH 29.5 26.0 - 34.0 pg   MCHC 32.7 30.0 - 36.0 g/dL   RDW 15.1 11.5 - 15.5 %   Platelets 228 150 - 400 K/uL   Neutrophils Relative % 63 %   Neutro Abs 5.7 1.7 - 7.7 K/uL   Lymphocytes Relative 25 %   Lymphs Abs 2.3 0.7 - 4.0 K/uL   Monocytes Relative 11 %   Monocytes Absolute 1.0 0.1 - 1.0 K/uL   Eosinophils Relative 1 %   Eosinophils Absolute 0.1 0.0 - 0.7 K/uL   Basophils Relative 0 %   Basophils Absolute 0.0 0.0 - 0.1 K/uL    Comment: Performed at [facility],  [place] [address], [place], [place] [address]  Brain natriuretic peptide     Status: Abnormal   Collection Time: 10/16/17  5:20 PM  Result Value Ref Range   B Natriuretic Peptide 244.4 (H) 0.0 - 100.0 pg/mL    Comment: Performed at [facility], [place] [address], [place], [place] [address]  I-stat troponin, ED  (not at [facility], [facility])     Status: None   Collection Time: 10/16/17  5:24 PM  Result Value Ref Range   Troponin i, poc 0.05 0.00 - 0.08 ng/mL   Comment 3            Comment: Due to the release kinetics of cTnI, a negative result within the first hours of the onset of symptoms does not rule out myocardial infarction with certainty. If myocardial infarction is still suspected, repeat the test at appropriate intervals.    Dg Chest Port 1 View  Result Date: 10/16/2017 CLINICAL DATA:  Shortness of breath EXAM: PORTABLE CHEST 1 VIEW COMPARISON:  August 16, 2017 FINDINGS: No edema or consolidation. There is cardiomegaly with pulmonary vascularity normal. There is aortic atherosclerosis. No adenopathy. No bone lesions. IMPRESSION: Cardiomegaly. No edema or consolidation. There is aortic atherosclerosis. Aortic Atherosclerosis (ICD10-I70.0). Electronically Signed   By: Lowella Grip III M.D.   On: 10/16/2017 17:10    Pending Labs Unresulted Labs (From admission, onward)    Start     Ordered   10/16/17 1646  Urinalysis, Routine w reflex microscopic  STAT,   STAT     10/16/17 1646          Vitals/Pain Today's Vitals   10/16/17 1731 10/16/17 1800 10/16/17 1830 10/16/17 1832  BP: 116/71 113/84 114/67   Pulse: 68 (!) 58 (!) 44 (!) 57  Resp: (!) 22 (!) 22 (!) 24   SpO2: 100% 99% 99%   Weight:      Height:      PainSc:        Isolation Precautions No active isolations  Medications Medications  furosemide (LASIX) injection  40 mg (40 mg Intravenous Given 10/16/17 1826)    Mobility walks with person assist

## 2017-10-17 ENCOUNTER — Observation Stay (HOSPITAL_COMMUNITY): Payer: Medicare HMO

## 2017-10-17 DIAGNOSIS — Z6836 Body mass index (BMI) 36.0-36.9, adult: Secondary | ICD-10-CM | POA: Diagnosis not present

## 2017-10-17 DIAGNOSIS — R404 Transient alteration of awareness: Secondary | ICD-10-CM | POA: Diagnosis not present

## 2017-10-17 DIAGNOSIS — I255 Ischemic cardiomyopathy: Secondary | ICD-10-CM | POA: Diagnosis not present

## 2017-10-17 DIAGNOSIS — R1311 Dysphagia, oral phase: Secondary | ICD-10-CM | POA: Diagnosis not present

## 2017-10-17 DIAGNOSIS — R748 Abnormal levels of other serum enzymes: Secondary | ICD-10-CM | POA: Diagnosis not present

## 2017-10-17 DIAGNOSIS — I5043 Acute on chronic combined systolic (congestive) and diastolic (congestive) heart failure: Secondary | ICD-10-CM | POA: Diagnosis not present

## 2017-10-17 DIAGNOSIS — I2510 Atherosclerotic heart disease of native coronary artery without angina pectoris: Secondary | ICD-10-CM | POA: Diagnosis not present

## 2017-10-17 DIAGNOSIS — I10 Essential (primary) hypertension: Secondary | ICD-10-CM | POA: Diagnosis not present

## 2017-10-17 DIAGNOSIS — I493 Ventricular premature depolarization: Secondary | ICD-10-CM | POA: Diagnosis present

## 2017-10-17 DIAGNOSIS — R41841 Cognitive communication deficit: Secondary | ICD-10-CM | POA: Diagnosis not present

## 2017-10-17 DIAGNOSIS — Z8249 Family history of ischemic heart disease and other diseases of the circulatory system: Secondary | ICD-10-CM | POA: Diagnosis not present

## 2017-10-17 DIAGNOSIS — I509 Heart failure, unspecified: Secondary | ICD-10-CM | POA: Diagnosis not present

## 2017-10-17 DIAGNOSIS — Z79890 Hormone replacement therapy (postmenopausal): Secondary | ICD-10-CM | POA: Diagnosis not present

## 2017-10-17 DIAGNOSIS — I5023 Acute on chronic systolic (congestive) heart failure: Secondary | ICD-10-CM | POA: Diagnosis not present

## 2017-10-17 DIAGNOSIS — I5033 Acute on chronic diastolic (congestive) heart failure: Secondary | ICD-10-CM | POA: Diagnosis not present

## 2017-10-17 DIAGNOSIS — E669 Obesity, unspecified: Secondary | ICD-10-CM | POA: Diagnosis present

## 2017-10-17 DIAGNOSIS — N183 Chronic kidney disease, stage 3 (moderate): Secondary | ICD-10-CM | POA: Diagnosis not present

## 2017-10-17 DIAGNOSIS — E785 Hyperlipidemia, unspecified: Secondary | ICD-10-CM | POA: Diagnosis not present

## 2017-10-17 DIAGNOSIS — R69 Illness, unspecified: Secondary | ICD-10-CM | POA: Diagnosis not present

## 2017-10-17 DIAGNOSIS — Z7401 Bed confinement status: Secondary | ICD-10-CM | POA: Diagnosis not present

## 2017-10-17 DIAGNOSIS — N3000 Acute cystitis without hematuria: Secondary | ICD-10-CM | POA: Diagnosis not present

## 2017-10-17 DIAGNOSIS — Z955 Presence of coronary angioplasty implant and graft: Secondary | ICD-10-CM | POA: Diagnosis not present

## 2017-10-17 DIAGNOSIS — Z7902 Long term (current) use of antithrombotics/antiplatelets: Secondary | ICD-10-CM | POA: Diagnosis not present

## 2017-10-17 DIAGNOSIS — R410 Disorientation, unspecified: Secondary | ICD-10-CM | POA: Diagnosis not present

## 2017-10-17 DIAGNOSIS — Z87891 Personal history of nicotine dependence: Secondary | ICD-10-CM | POA: Diagnosis not present

## 2017-10-17 DIAGNOSIS — N390 Urinary tract infection, site not specified: Secondary | ICD-10-CM | POA: Diagnosis not present

## 2017-10-17 DIAGNOSIS — M2550 Pain in unspecified joint: Secondary | ICD-10-CM | POA: Diagnosis not present

## 2017-10-17 DIAGNOSIS — R0602 Shortness of breath: Secondary | ICD-10-CM | POA: Diagnosis present

## 2017-10-17 DIAGNOSIS — R2689 Other abnormalities of gait and mobility: Secondary | ICD-10-CM | POA: Diagnosis not present

## 2017-10-17 DIAGNOSIS — R9431 Abnormal electrocardiogram [ECG] [EKG]: Secondary | ICD-10-CM | POA: Diagnosis not present

## 2017-10-17 DIAGNOSIS — Z79899 Other long term (current) drug therapy: Secondary | ICD-10-CM | POA: Diagnosis not present

## 2017-10-17 DIAGNOSIS — B9620 Unspecified Escherichia coli [E. coli] as the cause of diseases classified elsewhere: Secondary | ICD-10-CM | POA: Diagnosis not present

## 2017-10-17 DIAGNOSIS — I252 Old myocardial infarction: Secondary | ICD-10-CM | POA: Diagnosis not present

## 2017-10-17 DIAGNOSIS — R0603 Acute respiratory distress: Secondary | ICD-10-CM | POA: Diagnosis not present

## 2017-10-17 DIAGNOSIS — R32 Unspecified urinary incontinence: Secondary | ICD-10-CM | POA: Diagnosis not present

## 2017-10-17 DIAGNOSIS — F0390 Unspecified dementia without behavioral disturbance: Secondary | ICD-10-CM | POA: Diagnosis present

## 2017-10-17 DIAGNOSIS — I2583 Coronary atherosclerosis due to lipid rich plaque: Secondary | ICD-10-CM | POA: Diagnosis not present

## 2017-10-17 DIAGNOSIS — M6281 Muscle weakness (generalized): Secondary | ICD-10-CM | POA: Diagnosis not present

## 2017-10-17 DIAGNOSIS — I130 Hypertensive heart and chronic kidney disease with heart failure and stage 1 through stage 4 chronic kidney disease, or unspecified chronic kidney disease: Secondary | ICD-10-CM | POA: Diagnosis not present

## 2017-10-17 LAB — TROPONIN I
TROPONIN I: 0.08 ng/mL — AB (ref <0.03)
TROPONIN I: 0.08 ng/mL — AB (ref <0.03)

## 2017-10-17 LAB — BASIC METABOLIC PANEL
Anion gap: 10 (ref 5–15)
BUN: 20 mg/dL (ref 8–23)
CO2: 26 mmol/L (ref 22–32)
CREATININE: 1.21 mg/dL — AB (ref 0.44–1.00)
Calcium: 9.2 mg/dL (ref 8.9–10.3)
Chloride: 102 mmol/L (ref 98–111)
GFR calc Af Amer: 50 mL/min — ABNORMAL LOW (ref >60)
GFR, EST NON AFRICAN AMERICAN: 43 mL/min — AB (ref >60)
Glucose, Bld: 128 mg/dL — ABNORMAL HIGH (ref 70–99)
Potassium: 3.6 mmol/L (ref 3.5–5.1)
SODIUM: 138 mmol/L (ref 135–145)

## 2017-10-17 LAB — ECHOCARDIOGRAM COMPLETE
Height: 62 in
Weight: 3155.2 oz

## 2017-10-17 LAB — APTT: APTT: 27 s (ref 24–36)

## 2017-10-17 LAB — PROTIME-INR
INR: 1.06
PROTHROMBIN TIME: 13.7 s (ref 11.4–15.2)

## 2017-10-17 LAB — MRSA PCR SCREENING: MRSA by PCR: NEGATIVE

## 2017-10-17 LAB — TSH: TSH: 3.106 u[IU]/mL (ref 0.350–4.500)

## 2017-10-17 MED ORDER — HEPARIN (PORCINE) IN NACL 100-0.45 UNIT/ML-% IJ SOLN
800.0000 [IU]/h | INTRAMUSCULAR | Status: DC
  Administered 2017-10-17: 800 [IU]/h via INTRAVENOUS
  Filled 2017-10-17: qty 250

## 2017-10-17 MED ORDER — HEPARIN SODIUM (PORCINE) 5000 UNIT/ML IJ SOLN
5000.0000 [IU] | Freq: Three times a day (TID) | INTRAMUSCULAR | Status: DC
  Administered 2017-10-17 – 2017-10-24 (×22): 5000 [IU] via SUBCUTANEOUS
  Filled 2017-10-17 (×22): qty 1

## 2017-10-17 NOTE — Progress Notes (Signed)
  Echocardiogram 2D Echocardiogram has been performed.  Madelaine Etienne 10/17/2017, 2:28 PM

## 2017-10-17 NOTE — Consult Note (Addendum)
Cardiology Consult    Patient ID: Kaitlyn Simmons MRN: [id number], DOB/AGE: 74-Feb-1947   Admit date: 10/16/2017 Date of Consult: 10/17/2017  Primary Physician: Arlis Porta., MD Primary Cardiologist: No primary care provider on file. Requesting Provider: Lennice Sites, DO  Patient Profile    Kaitlyn Simmons is a 72 y.o. female with PMH significant for HFrEF (EF 45% 2011), ICM, CAD (s/p STEMI x2 2011/2012; BMS x3 with 2011 RCA  / 2012 mRCA, LAD), HLD, HTN, CKD (Baseline Cr 1-2), and dementia. [Patient poor historian d/t dementia and h/o 2011 encephalopathy thus PMH obtained via pt chart & by EMS.] She is being seen today for the evaluation of SOB and presumed CHF exacerbation at the request of Dr. Ronnald Nian  Past Medical History   Past Medical History:  Diagnosis Date  . CAD (coronary artery disease)    a. s/p IL STEMI 7/11 => tx with BMS to RCA (tx at [facility]);  b.  AL STEMI 7/12 => LHC: mRCA 100% ==> PCI with BMS to RCA; LM 10%, pCFX 50%, OM1 10%, pLAD 50%, mLAD 99%, 70%  ==>  c. staged PCI: BMS to  LAD (tx at [facility])  . Cataracts, bilateral   . Chronic systolic heart failure ([place])   . Dementia   . GERD (gastroesophageal reflux disease)   . HTN (hypertension)   . Hyperlipidemia   . Ischemic cardiomyopathy    a. EF 30-35% at time of AL STEMI 7/12;   b. IMPROVED by f/u Echo 9/13:  mild LVH, EF 45-50%, mid to dist Inf, AS, Apical HK, Gr 1 diast dysfn, MAC, mod LAE  . STEMI (ST elevation myocardial infarction) ([place]) 2011; 2012   [place]; [place]    Past Surgical History:  Procedure Laterality Date  . CATARACT EXTRACTION W/ INTRAOCULAR LENS IMPLANT Left ~ 2008  . CHOLECYSTECTOMY N/A 10/07/2012   Procedure: LAPAROSCOPIC CHOLECYSTECTOMY WITH INTRAOPERATIVE CHOLANGIOGRAM;  Surgeon: Shann Medal, MD;  Location: [facility] ORS;  Service: General;  Laterality: N/A;  . CORONARY ANGIOPLASTY WITH STENT PLACEMENT  2011; 2012   RCA; RCA & LAD  . DILATION AND CURETTAGE OF UTERUS  1970's  . ERCP N/A 10/08/2012    Procedure: ENDOSCOPIC RETROGRADE CHOLANGIOPANCREATOGRAPHY (ERCP);  Surgeon: Gatha Mayer, MD;  Location: [facility];  Service: Endoscopy;  Laterality: N/A;  . ERCP W/ SPHICTEROTOMY  11/10/2012   stones removed [place]  . TONSILLECTOMY AND ADENOIDECTOMY  1980's     Allergies  No Known Allergies  History of Present Illness    72 yo female with PMH significant for HFrEF (EF 45-50% 10/2011), ICM, CAD (s/p 2011 / 2012 STEMI with BMS to RCA x2 / LADx1), HLD, HTN, CKD (Baseline reported Cr 1-2), and dementia. Patient poor historian d/t dementia and HPI / PMH obtained via pt chart & by EMS. Patient reportedly lives at nursing home.  08/2010: Per care everywhere / Dr. Acie Fredrickson documentation 12/2011 - Patient s/p STEMI 08/2009 with BMS x1 to RCA at [place] STEMI 08/2010 with LHC and BMS x2 of RCA, LAD. Documented medical management of  blocker, ASA / Plavix, lisinopril, and statin with addition of Coreg and PRN Labetalol for SBP spikes >170 at that time. EF 30-35% at that time.   Patient last seen by Dr. Acie Fredrickson 01/04/2012 with note that pt doing well with some SOB likely in part 2/2 obesity. CHF documented as well controlled and plan for continued medical management with 11/23/2011 follow-up TTE that showed EF 45-50%, mid to dist  inf myocardium, apical HK, G1DD, mild LVH, mod LAE, AS. Advised to follow-up in six months.    On 10/16/17, patient presented to [facility] ED with nursing staff reporting c/o progressive SOB x 6-12 hours, resolved by presentation to [facility]. She denied fever, dry/productive cough, abdominal pain, recent falls. No reported alleviating or exacerbating factors. Prior hospitalizations reported 06/2016, rectal bleeding. She reports 60 pack year smoking history with quit date 10/25/02. She does not drink alcohol or use drugs. Family history significant for father with MI, stroke and deceased; sister with MI and HTN and living. NKDA. PTA cardiac medications included lipitor 34m, plavix 738m lisinopril  2.14m14mnd notable for qt prolonging risperidone, namenda.   In the ED, vitals signficant for BP 103/80, HR 62, RR 20, SpO2 99%. Labs significant for Cr 1.24, BUN 26, Na 141, K 3.8, WBC 9.1, Hgb 12.5, HCT 38.2. BNP 244.4. Poc Troponin 0.05 UA showing signs of infection. EKG NSR and HR 71 with new TWI V1-V3 and QTc 461. CXR with mild pulmonary congestion and 110m67msix started.   Inpatient Medications    . atorvastatin  80 mg Oral QHS  . clopidogrel  75 mg Oral Daily  . FLUoxetine  40 mg Oral Daily  . furosemide  40 mg Intravenous Daily  . memantine  5 mg Oral BID  . risperiDONE  1 mg Oral BID  . sodium chloride flush  3 mL Intravenous Q12H    Family History    Family History  Problem Relation Age of Onset  . Heart attack Father        DECEASED  . Stroke Father        DECEASED  . Leukemia Mother        DECEASED  . Heart attack Sister        ALIVE  . Hypertension Sister    She indicated that her mother is deceased. She indicated that her father is deceased. She indicated that only one of her two sisters is alive. She indicated that both of her children are alive.   Social History    Social History   Socioeconomic History  . Marital status: Single    Spouse name: Not on file  . Number of children: Not on file  . Years of education: Not on file  . Highest education level: Not on file  Occupational History  . Not on file  Social Needs  . Financial resource strain: Not on file  . Food insecurity:    Worry: Not on file    Inability: Not on file  . Transportation needs:    Medical: Not on file    Non-medical: Not on file  Tobacco Use  . Smoking status: Former Smoker    Packs/day: 2.00    Years: 30.00    Pack years: 60.00    Types: Cigarettes    Last attempt to quit: 10/25/2002    Years since quitting: 14.9  . Smokeless tobacco: Never Used  Substance and Sexual Activity  . Alcohol use: No  . Drug use: No  . Sexual activity: Never  Lifestyle  . Physical  activity:    Days per week: Not on file    Minutes per session: Not on file  . Stress: Not on file  Relationships  . Social connections:    Talks on phone: Not on file    Gets together: Not on file    Attends religious service: Not on file    Active member of club or organization:  Not on file    Attends meetings of clubs or organizations: Not on file    Relationship status: Not on file  . Intimate partner violence:    Fear of current or ex partner: Not on file    Emotionally abused: Not on file    Physically abused: Not on file    Forced sexual activity: Not on file  Other Topics Concern  . Not on file  Social History Narrative  . Not on file     Review of Systems    General:  No chills, fever, night sweats or weight changes.  Cardiovascular:  No chest pain, dyspnea on exertion, edema, orthopnea, palpitations, paroxysmal nocturnal dyspnea. Dermatological: No rash, lesions/masses Respiratory: No cough, dyspnea Urologic: No hematuria, dysuria Abdominal:   No nausea, vomiting, diarrhea, bright red blood per rectum, melena, or hematemesis Neurologic:  No visual changes, wkns, changes in mental status. All other systems reviewed and are otherwise negative except as noted above.  Physical Exam    Blood pressure 118/63, pulse 63, temperature 98 F (36.7 C), temperature source Oral, resp. rate 18, height _0  (1.575 m), weight 89.4 kg, SpO2 94 %.  General: WD WN [place], NAD Neuro: Does not respond verbally; demented. Moves all extremities spontaneously. HEENT: Normal  Neck: Supple without bruits or JVD. Lungs:  Resp regular and unlabored, CTA. Heart: RRR no s3, s4, 2/6 systolic murmur Abdomen: Soft, non-tender, non-distended, BS + x 4.  Extremities: No clubbing, cyanosis or edema. DP/PT/Radials 2+ and equal bilaterally.  Labs    Troponin [facility] of Care Test) Recent Labs    10/16/17 1724  TROPIPOC 0.05   Recent Labs    10/16/17 2156 10/17/17 0534  TROPONINI 0.08*  0.08*   Lab Results  Component Value Date   WBC 9.1 10/16/2017   HGB 12.5 10/16/2017   HCT 38.2 10/16/2017   MCV 90.1 10/16/2017   PLT 228 10/16/2017    Recent Labs  Lab 10/16/17 1720  NA 141  K 3.8  CL 105  CO2 24  BUN 26*  CREATININE 1.24*  CALCIUM 10.0  PROT 7.4  BILITOT 1.0  ALKPHOS 76  ALT 15  AST 24  GLUCOSE 86   Lab Results  Component Value Date   CHOL 129 10/04/2014   HDL 35 (L) 10/04/2014   LDLCALC 73 10/04/2014   TRIG 103 10/04/2014   Lab Results  Component Value Date   DDIMER 0.54 (H) 10/16/2017     Radiology Studies    Dg Chest Port 1 View  Result Date: 10/16/2017 CLINICAL DATA:  Shortness of breath EXAM: PORTABLE CHEST 1 VIEW COMPARISON:  August 16, 2017 FINDINGS: No edema or consolidation. There is cardiomegaly with pulmonary vascularity normal. There is aortic atherosclerosis. No adenopathy. No bone lesions. IMPRESSION: Cardiomegaly. No edema or consolidation. There is aortic atherosclerosis. Aortic Atherosclerosis (ICD10-I70.0). Electronically Signed   By: Lowella Grip III M.D.   On: 10/16/2017 17:10    ECG & Cardiac Imaging    11/23/2011 TTE Study Conclusions - Left ventricle: The cavity size was mildly dilated. Wall thickness was increased in a pattern of mild LVH. Systolic function was mildly reduced. The estimated ejection fraction was in the range of 45% to 50%. There is moderate hypokinesis of the mid-distalinferior myocardium. There is moderate hypokinesis of the mid-distalanteroseptal and apical myocardium. Doppler parameters are consistent with abnormal left ventricular relaxation (grade 1 diastolic dysfunction). Doppler parameters are consistent with high ventricular filling pressure. - Mitral valve: Calcified annulus. - Left  atrium: The atrium was moderately dilated. - Atrial septum: No defect or patent foramen ovale was identified.  10/17/17  TTE PENDING   Assessment & Plan    1. Acute on Chronic  combined systolic / diastolic CHF with h/o CAD and ICM - No CP - SOB reported at admission, currently resolved - H/o CAD / 2011 STEMI with BMS to RCA; 2012 STEMI BMS RCA, mLAD 2012 - Most recent cardiac imagine as above - Recommend updated echo - Troponin flat trending and 0.08 x3 - Ddimer 0.54 - Check lipid panel with h/o CAD  - Monitor electrolytes: K 3.6. Replete with goal 4.0 - Check Mg with goal 2.0 - Daily EKG - TSH 3.106 - Continue heparin drip with daily CBC - Continue IV lasix 51m as monitor renal function and volume status - Strict I/O; Daily wt - Continue home Plavix 761mpo qd, holding Lisinopril as below in #2, continue Lipitor 8015mo qd - Dr. CreStanford Breed see patient - further workup and plan pending his evaluation.  3. HTN - BP 118/63 - Holding ACE/ARB in setting of intermittent soft BP - Consider restarting ACE as HR/BP/ renal function allows - Holding BB with HR bradycardic at times and in setting of above. Continue to hold. - Per IM  4. HLD - Check Lipid panel - Continue statin - lipitor 20m5m- Goal LDL <70 - Per IM  5. CKD, III - Baseline Cr 1-2. Cr 1.21 - Daily BMET, CBC - Avoid nephrotoxins.  - Consider renal function if contrast cardiac studies with ample IVF.  - Monitor with ongoing IV diuresis - BMET monitor renal function and electrolytes - BP management per above and per IM  6. Remainder per IM  For questions or updates, please contact [facility] consult [email or web address] for contact info under Cardiology/STEMI.      SignDorthula Nettles-C  Pager [phone]/2019, 11:16 AM    As above, patient seen and examined.  Briefly she is a 71 y90r old female with past medical history of chronic combined systolic/diastolic congestive heart failure, coronary artery disease, hypertension, hyperlipidemia, chronic stage III kidney disease, dementia who we are asked to evaluate for elevated troponin and acute on chronic combined  systolic/diastolic CHF.  Patient cannot provide a history as she is severely demented and resides in a nursing home.  She is a no CODE BLUE.  The nursing staff apparently is felt like she had increased work of breathing and she was transferred to the hospital.  Patient cannot give history concerning chest pain or dyspnea.  Chest ray showed no edema.  Creatinine 1.21.  BNP 244.  Troponin 0 0.08, 0.08 and 0.08.  UA suggestive of UTI.  Electrocardiogram shows sinus rhythm with PVCs, prior anterior infarct.  Cannot rule out inferior infarct.  1 acute on chronic combined systolic/diastolic congestive heart failure-patient is not markedly volume overloaded on examination.  BNP minimally elevated.  She received IV Lasix x1.  Would convert to her home dose of Lasix which is 40 mg daily.  2 minimally elevated troponin-we cannot obtain history concerning chest pain as patient is severely demented.  Troponin is minimally elevated but there is no clear trend.  No acute ST changes.  Not consistent with acute coronary syndrome.  Given dementia she is not a candidate for aggressive cardiac evaluation.  Continue Plavix and statin.  3 coronary artery disease-continue medical therapy with Plavix and statin.  4 UTI per primary care.  5 hypertension-blood pressure is controlled.  Would continue preadmission medications.  6 dementia-patient is not a candidate for aggressive cardiac evaluation given severity of dementia.  She is a no CODE BLUE.  [facility] will sign off.   Medication Recommendations: Continue preadmission medications. Other recommendations (labs, testing, etc): No further cardiac testing. Follow up as an outpatient: Patient can follow-up with Dr. Acie Fredrickson in 3 months.  Kirk Ruths, MD

## 2017-10-17 NOTE — Care Management Note (Signed)
Case Management Note  Patient Details  Name: KISSY CIELO MRN: [id number] Date of Birth: 1946/02/18  Subjective/Objective:  Admitted w/CHF. From SNF-[facility]-memory care. CHF screen-only 1 adm/69months.EF 40%, has pcp. CSW already following for return back to SNF.PT cons.                  Action/Plan:d/c SNF-memory care.   Expected Discharge Date:  (unknown)               Expected Discharge Plan:  Home/Self Care  In-House Referral:     Discharge planning Services  CM Consult  Post Acute Care Choice:    Choice offered to:     DME Arranged:    DME Agency:     HH Arranged:    HH Agency:     Status of Service:     If discussed at [employer], dates discussed:    Additional Comments:  Dessa Phi, RN 10/17/2017, 1:01 PM

## 2017-10-17 NOTE — Progress Notes (Signed)
PROGRESS NOTE [facility]   Kaitlyn Simmons   XNT:[id number] DOB: 06-21-1945  DOA: 10/16/2017 PCP: Arlis Porta., MD   Brief Narrative:  Kaitlyn Simmons is a 72 year old female with medical history significant for dementia, CAD, combined CHF, hypertension and CKD stage III who presented to the emergency department complaining of shortness of breath.  Patient unable to provide history as she is mostly nonverbal.  Upon ED evaluation lab work-up revealed creatinine at 1.2, BNP of 244, UA concerning for UTI. Chest x-ray showed pulmonary congestion.  Patient was admitted with working diagnosis of acute CHF exacerbation.  Subjective: Patient seen and examined, she is nonverbal but not yes or no to questions.  Daughter at bedside.  Denies chest pain, shortness of breath has improved slightly.  Patient off oxygen.  No leg swelling.  Assessment & Plan: Acute on chronic combined systolic and diastolic heart failure Last echo on record in 2013 EF 40 to 45% with grade 1 diastolic dysfunction moderate hypokinesia in mid-distal inferior myocardium.  Patient was started on IV Lasix 40 mg daily, will continue for now.  Good diuresis. Monitor renal function closely. Strict I&O, Monitor daily weight low salt diet. Get ECHO, Monitor EKG   Elevated troponin  EKG with no signs of ischemic changes, likely demands ischemia.  Await echocardiogram, however given comorbidities unlikely will need further ischemic work-up. Troponin trend has been flat.  Will discontinue heparin drip.  Cardiology was consulted will await recommendation.   UTI Urine concerning of UTI with large WBC and positive nitrates Patient started on empiric ceftriaxone, urine culture pending  CKD stage III Creatinine seems to be at baseline Avoid nephrotoxic agent and hypotension Continue to monitor renal function  Hypertension BP stable Patient on Lasix, will resume lisinopril. Her BP closely  CAD  Asymptomatic, continue  plavix   DVT prophylaxis: Heparin  Code Status: Full Code Family Communication: Daughter at bedside  Disposition Plan: Home in 1-2 days pending on diuresis   Consultants:   Cardiology  Procedures:   None  Antimicrobials:  Ceftriaxone 8/21   Objective: Vitals:   10/16/17 2316 10/16/17 2319 10/17/17 0536 10/17/17 0706  BP:  (!) 111/46 118/63   Pulse:  (!) 55 63   Resp:   18   Temp:  99.2 F (37.3 C) 98 F (36.7 C)   TempSrc:  Oral Oral   SpO2:  99% 94%   Weight: 90.1 kg   89.4 kg  Height: 5\' 2"  (1.575 m)       Intake/Output Summary (Last 24 hours) at 10/17/2017 1151 Last data filed at 10/17/2017 0600 Gross per 24 hour  Intake 146.91 ml  Output 400 ml  Net -253.09 ml   Filed Weights   10/16/17 1636 10/16/17 2316 10/17/17 0706  Weight: 98.3 kg 90.1 kg 89.4 kg    Examination:  General exam: NAD, nonverbal HEENT: No JVD, OP clear  Respiratory system: Decreased breath sounds bilaterally, bibasilar crackles, no wheezing Cardiovascular system: S1 & S2 heard, RRR. No JVD, murmurs, rubs or gallops Gastrointestinal system: Abdomen is nondistended, soft and nontender. Normal bowel sounds heard. Central nervous system: Alert and oriented.  Extremities: Trace lower extremity edema bilaterally   Data Reviewed: I have personally reviewed following labs and imaging studies  CBC: Recent Labs  Lab 10/16/17 1720  WBC 9.1  NEUTROABS 5.7  HGB 12.5  HCT 38.2  MCV 90.1  PLT 967   Basic Metabolic Panel: Recent Labs  Lab 10/16/17 1720 10/17/17 1016  NA  141 138  K 3.8 3.6  CL 105 102  CO2 24 26  GLUCOSE 86 128*  BUN 26* 20  CREATININE 1.24* 1.21*  CALCIUM 10.0 9.2   GFR: Estimated Creatinine Clearance: 43 mL/min (A) (by C-G formula based on SCr of 1.21 mg/dL (H)). Liver Function Tests: Recent Labs  Lab 10/16/17 1720  AST 24  ALT 15  ALKPHOS 76  BILITOT 1.0  PROT 7.4  ALBUMIN 4.0   No results for input(s): LIPASE, AMYLASE in the last 168 hours. No  results for input(s): AMMONIA in the last 168 hours. Coagulation Profile: Recent Labs  Lab 10/17/17 0218  INR 1.06   Cardiac Enzymes: Recent Labs  Lab 10/16/17 2156 10/17/17 0534 10/17/17 1016  TROPONINI 0.08* 0.08* 0.08*   BNP (last 3 results) No results for input(s): PROBNP in the last 8760 hours. HbA1C: No results for input(s): HGBA1C in the last 72 hours. CBG: No results for input(s): GLUCAP in the last 168 hours. Lipid Profile: No results for input(s): CHOL, HDL, LDLCALC, TRIG, CHOLHDL, LDLDIRECT in the last 72 hours. Thyroid Function Tests: Recent Labs    10/17/17 0534  TSH 3.106   Anemia Panel: No results for input(s): VITAMINB12, FOLATE, FERRITIN, TIBC, IRON, RETICCTPCT in the last 72 hours. Sepsis Labs: No results for input(s): PROCALCITON, LATICACIDVEN in the last 168 hours.  No results found for this or any previous visit (from the past 240 hour(s)).    Radiology Studies: Dg Chest Port 1 View  Result Date: 10/16/2017 CLINICAL DATA:  Shortness of breath EXAM: PORTABLE CHEST 1 VIEW COMPARISON:  August 16, 2017 FINDINGS: No edema or consolidation. There is cardiomegaly with pulmonary vascularity normal. There is aortic atherosclerosis. No adenopathy. No bone lesions. IMPRESSION: Cardiomegaly. No edema or consolidation. There is aortic atherosclerosis. Aortic Atherosclerosis (ICD10-I70.0). Electronically Signed   By: Lowella Grip III M.D.   On: 10/16/2017 17:10    Scheduled Meds: . atorvastatin  80 mg Oral QHS  . clopidogrel  75 mg Oral Daily  . FLUoxetine  40 mg Oral Daily  . furosemide  40 mg Intravenous Daily  . memantine  5 mg Oral BID  . risperiDONE  1 mg Oral BID  . sodium chloride flush  3 mL Intravenous Q12H   Continuous Infusions: . sodium chloride 250 mL (10/17/17 0045)  . cefTRIAXone (ROCEPHIN)  IV 1 g (10/17/17 0046)  . heparin 800 Units/hr (10/17/17 0529)     LOS: 0 days    Time spent: Total of 35 minutes spent with pt, greater than  50% of which was spent in discussion of  treatment, counseling and coordination of care   Chipper Oman, MD Pager: Text Page via [email or web address]   If 7PM-7AM, please contact night-coverage [email or web address] 10/17/2017, 11:51 AM   Note - This record has been created using [employer]. Chart creation errors have been sought, but may not always have been located. Such creation errors do not reflect on the standard of medical care.

## 2017-10-17 NOTE — Progress Notes (Signed)
ANTICOAGULATION CONSULT NOTE - Initial Consult  Pharmacy Consult for IV heparin Indication: chest pain/ACS  No Known Allergies  Patient Measurements: Height: _0  (157.5 cm) Weight: 198 lb 11.2 oz (90.1 kg) IBW/kg (Calculated) : 50.1 Heparin Dosing Weight: 62 kg  Vital Signs: Temp: 99.2 F (37.3 C) (08/21 2319) Temp Source: Oral (08/21 2319) BP: 111/46 (08/21 2319) Pulse Rate: 55 (08/21 2319)  Labs: Recent Labs    10/16/17 1720 10/16/17 2156  HGB 12.5  --   HCT 38.2  --   PLT 228  --   CREATININE 1.24*  --   TROPONINI  --  0.08*    Estimated Creatinine Clearance: 42.2 mL/min (A) (by C-G formula based on SCr of 1.24 mg/dL (H)).   Medical History: Past Medical History:  Diagnosis Date  . CAD (coronary artery disease)    a. s/p IL STEMI 7/11 => tx with BMS to RCA (tx at [facility]);  b.  AL STEMI 7/12 => LHC: mRCA 100% ==> PCI with BMS to RCA; LM 10%, pCFX 50%, OM1 10%, pLAD 50%, mLAD 99%, 70%  ==>  c. staged PCI: BMS to  LAD (tx at [facility])  . Cataracts, bilateral   . Chronic systolic heart failure ([place])   . Dementia   . GERD (gastroesophageal reflux disease)   . HTN (hypertension)   . Hyperlipidemia   . Ischemic cardiomyopathy    a. EF 30-35% at time of AL STEMI 7/12;   b. IMPROVED by f/u Echo 9/13:  mild LVH, EF 45-50%, mid to dist Inf, AS, Apical HK, Gr 1 diast dysfn, MAC, mod LAE  . STEMI (ST elevation myocardial infarction) ([place]) 2011; 2012   [place]; [place]    Medications:  Scheduled:  . atorvastatin  80 mg Oral QHS  . clopidogrel  75 mg Oral Daily  . FLUoxetine  40 mg Oral Daily  . furosemide  40 mg Intravenous Daily  . memantine  5 mg Oral BID  . risperiDONE  1 mg Oral BID  . sodium chloride flush  3 mL Intravenous Q12H   Infusions:  . sodium chloride 250 mL (10/17/17 0045)  . cefTRIAXone (ROCEPHIN)  IV 1 g (10/17/17 0046)    Assessment: 32 yoF c/o SOB. Troponin = 0.08  IV heparin for ACS.  Goal of Therapy:  Heparin level 0.3-0.7 units/ml Monitor platelets  by anticoagulation protocol: Yes   Plan:  Baseline aptt and PT/INR stat No bolus as patient received 30 mg Lovenox 8/22 0038 Start heparin drip at 800 units/hr Daily CBC/HL  Check 1st HL in 8 hours  Lawana Pai R 10/17/2017,2:30 AM

## 2017-10-17 NOTE — Evaluation (Signed)
Physical Therapy Evaluation Patient Details Name: Kaitlyn Simmons MRN: [id number] DOB: 1945-12-11 Today's Date: 10/17/2017   History of Present Illness  72 YO female admitted to ED from SNF on 8/22 for shortness of breath, potential UTI. PMH significant for advanced dementia, CAD, CHF, HTN, CKD III.   Clinical Impression  Pt is a 72 YO female with advanced dementia admitted from SNF on 8/22 for shortness of breath and potential UTI. Other PMH as stated above. Pt requires max to total assist for all mobility. Daughter not at bedside to determine if this is baseline. PT recommending return to SNF and pt may benefit from PT there, but unsure if pt was truly ambulating and performing other mobility herself prior to admission or not. Pt with no acute PT needs at this time.     Follow Up Recommendations SNF;Supervision/Assistance - 24 hour(return to [facility])    Equipment Recommendations  None recommended by PT    Recommendations for Other Services       Precautions / Restrictions Precautions Precautions: Fall Restrictions Weight Bearing Restrictions: No      Mobility  Bed Mobility Overal bed mobility: Needs Assistance Bed Mobility: Supine to Sit;Sit to Supine     Supine to sit: Max assist;HOB elevated Sit to supine: Max assist;HOB elevated   General bed mobility comments: Max assist for supine<>sit for LE movement to EOB, trunk elevation, scooting. Verbal cuing attempted but very little success; short verbal commands (example "scoot", "stand") and tactile cuing more effective. Total assist for scooting up in bed.   Transfers Overall transfer level: Needs assistance Equipment used: Rolling walker (2 wheeled) Transfers: Sit to/from Stand Sit to Stand: Max assist         General transfer comment: Mod assist for power up, trunk elevation, hip extension. attempts x2. Took 3 small steps when standing, 3 small steps back to bed. Max guarding and RW movement with stepping.    Ambulation/Gait                Stairs            Wheelchair Mobility    Modified Rankin (Stroke Patients Only)       Balance Overall balance assessment: Needs assistance Sitting-balance support: Bilateral upper extremity supported;Feet supported Sitting balance-Leahy Scale: Poor   Postural control: Posterior lean Standing balance support: Bilateral upper extremity supported Standing balance-Leahy Scale: Poor                               Pertinent Vitals/Pain Pain Assessment: Faces Faces Pain Scale: No hurt    Home Living Family/patient expects to be discharged to:: Skilled nursing facility([facility])                 Additional Comments: pt unable to give history due to advanced dementia, RN states pt ambulated with RW per previous note     Prior Function Level of Independence: Needs assistance(no one at bedside to answer PLOF questions, pt unable to answer)               Hand Dominance        Extremity/Trunk Assessment   Upper Extremity Assessment Upper Extremity Assessment: Generalized weakness    Lower Extremity Assessment Lower Extremity Assessment: Generalized weakness       Communication   Communication: Expressive difficulties(responds yes, no )  Cognition Arousal/Alertness: Awake/alert Behavior During Therapy: Anxious Overall Cognitive Status: History of cognitive impairments -  at baseline                                 General Comments: Pt with advanced dementia, able to state yes/no when instructed with short commands for mobility       General Comments      Exercises     Assessment/Plan    PT Assessment All further PT needs can be met in the next venue of care  PT Problem List Decreased strength;Decreased cognition;Decreased activity tolerance;Decreased knowledge of use of DME;Decreased balance;Decreased safety awareness;Decreased mobility       PT Treatment Interventions       PT Goals (Current goals can be found in the Care Plan section)  Acute Rehab PT Goals PT Goal Formulation: With patient Time For Goal Achievement: 10/17/17 Potential to Achieve Goals: Fair    Frequency     Barriers to discharge        Co-evaluation               AM-PAC PT "6 Clicks" Daily Activity  Outcome Measure Difficulty turning over in bed (including adjusting bedclothes, sheets and blankets)?: Unable Difficulty moving from lying on back to sitting on the side of the bed? : Unable Difficulty sitting down on and standing up from a chair with arms (e.g., wheelchair, bedside commode, etc,.)?: Unable Help needed moving to and from a bed to chair (including a wheelchair)?: A Lot Help needed walking in hospital room?: Total Help needed climbing 3-5 steps with a railing? : Total 6 Click Score: 7    End of Session Equipment Utilized During Treatment: Gait belt Activity Tolerance: Patient limited by fatigue Patient left: in bed;with bed alarm set;with call bell/phone within reach;with nursing/sitter in room Nurse Communication: Mobility status PT Visit Diagnosis: Other abnormalities of gait and mobility (R26.89);Unsteadiness on feet (R26.81)    Time: 1740-1810 PT Time Calculation (min) (ACUTE ONLY): 30 min   Charges:   PT Evaluation $PT Eval Low Complexity: 1 Low PT Treatments $Therapeutic Activity: 8-22 mins        Kaitlyn Simmons, PT, DPT  Pager # [phone]    Kaitlyn Simmons 10/17/2017, 6:32 PM

## 2017-10-17 NOTE — Progress Notes (Signed)
Patient received from the ED in stable condition. No family with the patient at the time of admission; unable to complete admission history.

## 2017-10-18 DIAGNOSIS — I5023 Acute on chronic systolic (congestive) heart failure: Secondary | ICD-10-CM

## 2017-10-18 DIAGNOSIS — I5033 Acute on chronic diastolic (congestive) heart failure: Secondary | ICD-10-CM

## 2017-10-18 DIAGNOSIS — F0391 Unspecified dementia with behavioral disturbance: Secondary | ICD-10-CM

## 2017-10-18 LAB — CBC
HEMATOCRIT: 42.4 % (ref 36.0–46.0)
Hemoglobin: 13.3 g/dL (ref 12.0–15.0)
MCH: 29.4 pg (ref 26.0–34.0)
MCHC: 31.4 g/dL (ref 30.0–36.0)
MCV: 93.8 fL (ref 78.0–100.0)
Platelets: 209 10*3/uL (ref 150–400)
RBC: 4.52 MIL/uL (ref 3.87–5.11)
RDW: 15.3 % (ref 11.5–15.5)
WBC: 6.3 10*3/uL (ref 4.0–10.5)

## 2017-10-18 LAB — BASIC METABOLIC PANEL
Anion gap: 10 (ref 5–15)
BUN: 19 mg/dL (ref 8–23)
CHLORIDE: 107 mmol/L (ref 98–111)
CO2: 24 mmol/L (ref 22–32)
CREATININE: 1.08 mg/dL — AB (ref 0.44–1.00)
Calcium: 9.4 mg/dL (ref 8.9–10.3)
GFR calc Af Amer: 58 mL/min — ABNORMAL LOW (ref >60)
GFR calc non Af Amer: 50 mL/min — ABNORMAL LOW (ref >60)
GLUCOSE: 87 mg/dL (ref 70–99)
Potassium: 3.6 mmol/L (ref 3.5–5.1)
SODIUM: 141 mmol/L (ref 135–145)

## 2017-10-18 LAB — MAGNESIUM: MAGNESIUM: 2.1 mg/dL (ref 1.7–2.4)

## 2017-10-18 MED ORDER — CEPHALEXIN 500 MG PO CAPS: 500.0000 mg | ORAL_CAPSULE | Freq: Four times a day (QID) | ORAL | 0 refills | Status: DC

## 2017-10-18 MED ORDER — FUROSEMIDE 40 MG PO TABS
40.0000 mg | ORAL_TABLET | Freq: Every morning | ORAL | Status: DC
  Administered 2017-10-18 – 2017-10-19 (×2): 40 mg via ORAL
  Filled 2017-10-18 (×2): qty 1

## 2017-10-18 MED ORDER — CEPHALEXIN 500 MG PO CAPS
500.0000 mg | ORAL_CAPSULE | Freq: Four times a day (QID) | ORAL | Status: DC
  Administered 2017-10-18 – 2017-10-23 (×16): 500 mg via ORAL
  Filled 2017-10-18 (×19): qty 1

## 2017-10-18 NOTE — NC FL2 (Signed)
[place] LEVEL OF CARE SCREENING TOOL     IDENTIFICATION  Patient Name: Kaitlyn Simmons Birthdate: [birth year] Sex: female Admission Date (Current Location): 10/16/2017  Csf - [place] and [place] Number:  Herbalist and Address:  [facility],  [place], [place]      Provider Number: [id number]  Attending Physician Name and Address:  Tawni Millers  Relative Name and Phone Number:       Current Level of Care: Hospital Recommended Level of Care: [place] Prior Approval Number:    Date Approved/Denied: 10/18/17 PASRR Number: [id number]  Discharge Plan: SNF    Current Diagnoses: Patient Active Problem List   Diagnosis Date Noted  . CHF (congestive heart failure) ([place]) 10/17/2017  . Abnormal ECG 10/16/2017  . Acute on chronic combined systolic and diastolic CHF (congestive heart failure) ([place]) 10/16/2017  . Acute respiratory distress 10/16/2017  . Acute lower UTI 10/16/2017  . CHF exacerbation ([place]) 10/16/2017  . Coronary artery disease involving native coronary artery of native heart without angina pectoris   . Lower GI bleed 07/08/2016  . CKD (chronic kidney disease), stage III ([place]) 07/08/2016  . Normocytic anemia 07/08/2016  . Chronic combined systolic and diastolic CHF (congestive heart failure) ([place]) 07/08/2016  . Rectal bleeding   . Hematochezia   . Acute blood loss anemia   . Alzheimer's dementia, late onset, with behavioral disturbance 06/21/2015  . Body mass index 40.0-44.9, adult ([place]) 12/17/2014  . Morbid obesity ([place]) 12/17/2014  . Depression 09/17/2014  . Calculus of bile duct without mention of cholecystitis or obstruction 10/08/2012  . Abnormal cholangiogram 10/08/2012  . Mucosal abnormality of duodenum 10/08/2012  . Coronary artery disease due to lipid rich plaque   . Chronic systolic CHF (congestive heart failure) ([place])   . Essential hypertension   . Hyperlipidemia   .  Ischemic cardiomyopathy     Orientation RESPIRATION BLADDER Height & Weight     Self  Normal External catheter, Incontinent Weight: 195 lb 3.2 oz (88.5 kg) Height:  5\' 2"  (157.5 cm)  BEHAVIORAL SYMPTOMS/MOOD NEUROLOGICAL BOWEL NUTRITION STATUS      Continent Diet(see dc summary)  AMBULATORY STATUS COMMUNICATION OF NEEDS Skin   Extensive Assist Verbally Normal                       Personal Care Assistance Level of Assistance  Bathing, Feeding, Dressing Bathing Assistance: Limited assistance Feeding assistance: Independent Dressing Assistance: Limited assistance     Functional Limitations Info  Sight, Hearing, Speech Sight Info: Adequate Hearing Info: Adequate Speech Info: Adequate    SPECIAL CARE FACTORS FREQUENCY  PT (By licensed PT), OT (By licensed OT)     PT Frequency: 5x/week OT Frequency: 5x/week            Contractures Contractures Info: Not present    Additional Factors Info  Code Status, Allergies Code Status Info: Full Allergies Info: NKA           Current Medications (10/18/2017):  This is the current hospital active medication list Current Facility-Administered Medications  Medication Dose Route Frequency Provider Last Rate Last Dose  . 0.9 %  sodium chloride infusion  250 mL Intravenous PRN Toy Baker, MD 10 mL/hr at 10/18/17 0600    . acetaminophen (TYLENOL) tablet 650 mg  650 mg Oral Q4H PRN Doutova, Anastassia, MD      . atorvastatin (LIPITOR) tablet 80 mg  80 mg Oral QHS Doutova,  Anastassia, MD   80 mg at 10/17/17 2236  . cephALEXin (KEFLEX) capsule 500 mg  500 mg Oral Q6H Arrien, Jimmy Picket, MD      . clopidogrel (PLAVIX) tablet 75 mg  75 mg Oral Daily Doutova, Anastassia, MD   75 mg at 10/18/17 1140  . FLUoxetine (PROZAC) capsule 40 mg  40 mg Oral Daily Doutova, Anastassia, MD   40 mg at 10/18/17 1140  . furosemide (LASIX) tablet 40 mg  40 mg Oral q morning - 10a Barrett, Rhonda G, PA-C   40 mg at 10/18/17 1141  . heparin  injection 5,000 Units  5,000 Units Subcutaneous Q8H Patrecia Pour, Christean Grief, MD   5,000 Units at 10/18/17 0510  . memantine (NAMENDA) tablet 5 mg  5 mg Oral BID Toy Baker, MD   5 mg at 10/18/17 1141  . ondansetron (ZOFRAN) injection 4 mg  4 mg Intravenous Q6H PRN Doutova, Anastassia, MD      . risperiDONE (RISPERDAL) 1 MG/ML oral solution 1 mg  1 mg Oral BID Doutova, Anastassia, MD   1 mg at 10/18/17 1139  . sodium chloride flush (NS) 0.9 % injection 3 mL  3 mL Intravenous Q12H Doutova, Anastassia, MD   3 mL at 10/18/17 1141  . sodium chloride flush (NS) 0.9 % injection 3 mL  3 mL Intravenous PRN Toy Baker, MD         Discharge Medications: Please see discharge summary for a list of discharge medications.  Relevant Imaging Results:  Relevant Lab Results:   Additional Information SSN:  [id number]  Servando Snare, LCSW

## 2017-10-18 NOTE — Discharge Summary (Addendum)
Physician Discharge Summary  Kaitlyn Simmons ERD:[id number] DOB: February 14, 1946 DOA: 10/16/2017  PCP: Arlis Porta., MD  Admit date: 10/16/2017 Discharge date: 10/21/2017 Admitted From: SNF  Disposition:  SNF  Recommendations for Outpatient Follow-up and new medication changes:  Follow up with Dr. Luan Pulling in 7 days Start lasix 20 mg daily 8/30.adjust the dose up or down as needed when she received lasix 40 mg she tends to drop her BP.She also has poor oral intake. Home Health: na  Equipment/Devices: na    Discharge Condition: stable  CODE STATUS: full  Diet recommendation: Heart healthy   Brief/Interim Summary: 72 year old female who presented with dyspnea.  She does have significant past medical history for coronary artery disease with ischemic cardiomyopathy, ejection fraction 45%, coronary artery disease, chronic kidney disease stage III and dementia.  Patient reported dyspnea at the skilled nursing facility, that prompted her to be transfer to the hospital.  On her initial physical examination her blood pressure was 103/80, heart rate 62, respiratory rate 20, oxygen saturation 99%.  Her lungs no significant rales or wheezing, heart S1-S2 present and rhythmic, abdomen soft nontender, no significant lower extremity edema.   Sodium 141, potassium 3.8, chloride 105, bicarb 24 glucose 86, BUN 26, creatinine 1.24, BNP 244, white count 9.1, hemoglobin 12.5, hematocrit 38.2, platelets 228, urinalysis 21-50 white cells, specific gravity 1.014.  Her chest x-ray had increased interstitial markings bilaterally.  EKG sinus rhythm, left axis deviation, positive PACs and PVCs, poor R wave progression.  Patient was admitted to the hospital with the working diagnosis of acute decompensation of chronic diastolic heart failure, complicated by a urinary tract infection.   1.  Acute on chronic systolic heart failure exacerbation/ischemic cardiomyopathy.  Patient was admitted to the medical ward, she was  placed on a remote telemetry monitor, she received diuresis with IV furosemide, and negative fluid balance was achieved (-1,832 ml) with improvement of her symptoms.  Further work-up with echocardiography showed worsening on LV systolic function down to 25 to 35% with akinesis of the apical and lateral septal/apical myocardium.  Severe hypokinesis of the basilar inferior myocardium.  Continue antiplatelet therapy with clopidogrel.dc ace due to soft bp.  2.  Stage III chronic kidney disease.  Kidney function remained stable with serum creatinine 1.0-1.2, discharge potassium 3.6, serum bicarb 24.restart furosemide at 20 mg 8/30 due to soft bp.  3.  E. coli urinary tract infection, present on admission.  Patient was placed on antibiotic therapy IV ceftriaxone with improvement of her symptoms.finished keflex course.  4.  Dementia.  No significant agitation, patient will continue fluoxetine, memantine, risperidone, and melatonin.     Discharge Diagnoses:  Active Problems:   Coronary artery disease due to lipid rich plaque   Essential hypertension   CKD (chronic kidney disease), stage III (HCC)   Abnormal ECG   Acute on chronic combined systolic and diastolic CHF (congestive heart failure) (HCC)   Acute respiratory distress   Acute lower UTI   CHF exacerbation (HCC)   CHF (congestive heart failure) ([place])    Discharge Instructions   Allergies as of 10/18/2017   No Known Allergies     Medication List    TAKE these medications   acetaminophen 500 MG tablet Commonly known as:  TYLENOL Take 500 mg by mouth 2 (two) times daily as needed (knee pain).   atorvastatin 80 MG tablet Commonly known as:  LIPITOR Take 1 tablet (80 mg total) by mouth at bedtime.   cephALEXin 500 MG capsule  Commonly known as:  KEFLEX Take 1 capsule (500 mg total) by mouth 4 (four) times daily for 3 days.   clopidogrel 75 MG tablet Commonly known as:  PLAVIX Take 1 tablet (75 mg total) by mouth daily.    dimethicone-zinc oxide cream Apply 1 application topically daily. And as needed   FLUoxetine 10 MG capsule Commonly known as:  PROZAC Take 40 mg by mouth daily.   furosemide 40 MG tablet Commonly known as:  LASIX Take 1 tablet (40 mg total) by mouth every morning. What changed:  how much to take   lisinopril 2.5 MG tablet Commonly known as:  PRINIVIL,ZESTRIL Take 1 tablet (2.5 mg total) by mouth daily.   Melatonin 5 MG Tabs Take 5 mg by mouth at bedtime.   memantine 5 MG tablet Commonly known as:  NAMENDA Take 5 mg by mouth 2 (two) times daily.   S[place]ard 200-200-20 MG/5ML suspension Generic drug:  alum & mag hydroxide-simeth Take 30 mLs by mouth every 6 (six) hours as needed for indigestion or heartburn.   nitroGLYCERIN 0.4 MG SL tablet Commonly known as:  NITROSTAT Place 1 tablet (0.4 mg total) under the tongue every 5 (five) minutes as needed for chest pain.   nystatin powder Generic drug:  nystatin Apply 1 application topically 2 (two) times daily.   OCUVITE ADULT 50+ Caps Take 1 capsule by mouth every morning.   risperiDONE 1 MG/ML oral solution Commonly known as:  RISPERDAL Take 1 mL by mouth 2 (two) times daily.      Follow-up Information    Nahser, Wonda Cheng, MD Follow up.   Specialty:  Cardiology Why:  The office will call. Contact information: [place] [address] [address] [phone]          No Known Allergies  Consultations:  Cardiology    Procedures/Studies: Dg Chest Port 1 View  Result Date: 10/16/2017 CLINICAL DATA:  Shortness of breath EXAM: PORTABLE CHEST 1 VIEW COMPARISON:  August 16, 2017 FINDINGS: No edema or consolidation. There is cardiomegaly with pulmonary vascularity normal. There is aortic atherosclerosis. No adenopathy. No bone lesions. IMPRESSION: Cardiomegaly. No edema or consolidation. There is aortic atherosclerosis. Aortic Atherosclerosis (ICD10-I70.0). Electronically Signed   By: Lowella Grip  III M.D.   On: 10/16/2017 17:10       Subjective: Patient with no apparent dyspnea, apparently close to her baseline, her daughter is at the bedside  Discharge Exam: Vitals:   10/17/17 2049 10/18/17 0501  BP: (!) 131/116 (!) 107/53  Pulse: (!) 59 (!) 52  Resp: 18 16  Temp: 98.2 F (36.8 C) 98 F (36.7 C)  SpO2: 99% 97%   Vitals:   10/17/17 0706 10/17/17 1328 10/17/17 2049 10/18/17 0501  BP:  104/67 (!) 131/116 (!) 107/53  Pulse:  62 (!) 59 (!) 52  Resp:  18 18 16   Temp:  98 F (36.7 C) 98.2 F (36.8 C) 98 F (36.7 C)  TempSrc:  Oral Oral Oral  SpO2:  97% 99% 97%  Weight: 89.4 kg   88.5 kg  Height:        General: Not in pain or dyspnea Neurology: Awake and alert, non focal  E ENT: no pallor, no icterus, oral mucosa moist Cardiovascular: No JVD. S1-S2 present, rhythmic, no gallops, rubs, or murmurs. No lower extremity edema. Pulmonary: postive breath sounds bilaterally, adequate air movement, no wheezing, rhonchi or rales. Gastrointestinal. Abdomen with no organomegaly, non tender, no rebound or guarding Skin. No rashes  Musculoskeletal: no joint deformities   The results of significant diagnostics from this hospitalization (including imaging, microbiology, ancillary and laboratory) are listed below for reference.     Microbiology: Recent Results (from the past 240 hour(s))  Urine Culture     Status: Abnormal (Preliminary result)   Collection Time: 10/16/17  9:37 PM  Result Value Ref Range Status   Specimen Description   Final    URINE, CLEAN CATCH Performed at [facility], [place] [address], [place], [place] [address]    Special Requests   Final    NONE Performed at [facility], [place] [address], [place], [place] [address]    Culture (A)  Final    >=100,000 COLONIES/mL ESCHERICHIA COLI SUSCEPTIBILITIES TO FOLLOW Performed at [facility], [place] [address] [place], [place] [address]    Report Status PENDING   Incomplete  MRSA PCR Screening     Status: None   Collection Time: 10/17/17  4:09 PM  Result Value Ref Range Status   MRSA by PCR NEGATIVE NEGATIVE Final    Comment:        The GeneXpert MRSA Assay (FDA approved for NASAL specimens only), is one component of a comprehensive MRSA colonization surveillance program. It is not intended to diagnose MRSA infection nor to guide or monitor treatment for MRSA infections. Performed at [facility], [place] [address], [place], [place] [address]      Labs: BNP (last 3 results) Recent Labs    10/16/17 1720  BNP 941.7*   Basic Metabolic Panel: Recent Labs  Lab 10/16/17 1720 10/17/17 1016 10/18/17 0516  NA 141 138 141  K 3.8 3.6 3.6  CL 105 102 107  CO2 24 26 24   GLUCOSE 86 128* 87  BUN 26* 20 19  CREATININE 1.24* 1.21* 1.08*  CALCIUM 10.0 9.2 9.4  MG  --   --  2.1   Liver Function Tests: Recent Labs  Lab 10/16/17 1720  AST 24  ALT 15  ALKPHOS 76  BILITOT 1.0  PROT 7.4  ALBUMIN 4.0   No results for input(s): LIPASE, AMYLASE in the last 168 hours. No results for input(s): AMMONIA in the last 168 hours. CBC: Recent Labs  Lab 10/16/17 1720  WBC 9.1  NEUTROABS 5.7  HGB 12.5  HCT 38.2  MCV 90.1  PLT 228   Cardiac Enzymes: Recent Labs  Lab 10/16/17 2156 10/17/17 0534 10/17/17 1016  TROPONINI 0.08* 0.08* 0.08*   BNP: Invalid input(s): POCBNP CBG: No results for input(s): GLUCAP in the last 168 hours. D-Dimer Recent Labs    10/16/17 2156  DDIMER 0.54*   Hgb A1c No results for input(s): HGBA1C in the last 72 hours. Lipid Profile No results for input(s): CHOL, HDL, LDLCALC, TRIG, CHOLHDL, LDLDIRECT in the last 72 hours. Thyroid function studies Recent Labs    10/17/17 0534  TSH 3.106   Anemia work up No results for input(s): VITAMINB12, FOLATE, FERRITIN, TIBC, IRON, RETICCTPCT in the last 72 hours. Urinalysis    Component Value Date/Time   COLORURINE YELLOW 10/16/2017 1646    APPEARANCEUR HAZY (A) 10/16/2017 1646   LABSPEC 1.014 10/16/2017 1646   PHURINE 6.0 10/16/2017 1646   GLUCOSEU NEGATIVE 10/16/2017 1646   HGBUR NEGATIVE 10/16/2017 1646   BILIRUBINUR NEGATIVE 10/16/2017 1646   KETONESUR NEGATIVE 10/16/2017 1646   PROTEINUR NEGATIVE 10/16/2017 1646   NITRITE POSITIVE (A) 10/16/2017 1646   LEUKOCYTESUR LARGE (A) 10/16/2017 1646   Sepsis Labs Invalid input(s):  PROCALCITONIN,  WBC,  LACTICIDVEN Microbiology Recent Results (from the past 240 hour(s))  Urine Culture     Status: Abnormal (Preliminary result)   Collection Time: 10/16/17  9:37 PM  Result Value Ref Range Status   Specimen Description   Final    URINE, CLEAN CATCH Performed at [facility], [place] [address] [place], [place] [address]    Special Requests   Final    NONE Performed at [facility], [place] [address], [place], [place] [address]    Culture (A)  Final    >=100,000 COLONIES/mL ESCHERICHIA COLI SUSCEPTIBILITIES TO FOLLOW Performed at [facility], [place] [address], [place], [place] [address]    Report Status PENDING  Incomplete  MRSA PCR Screening     Status: None   Collection Time: 10/17/17  4:09 PM  Result Value Ref Range Status   MRSA by PCR NEGATIVE NEGATIVE Final    Comment:        The GeneXpert MRSA Assay (FDA approved for NASAL specimens only), is one component of a comprehensive MRSA colonization surveillance program. It is not intended to diagnose MRSA infection nor to guide or monitor treatment for MRSA infections. Performed at [facility], [place] [address] [place], [place] [address]      Time coordinating discharge: 45 minutes  SIGNED:   Tawni Millers, MD  [facility] 10/18/2017, 1:24 PM Pager [phone]  If 7PM-7AM, please contact night-coverage [email or web address] Password [email or web address]

## 2017-10-18 NOTE — Clinical Social Work Note (Signed)
Clinical Social Work Assessment  Patient Details  Name: Kaitlyn Simmons MRN: [id number] Date of Birth: April 19, 1945  Date of referral:  10/18/17               Reason for consult:  Facility Placement                Permission sought to share information with:  Case Manager, Customer service manager, Family Supports Permission granted to share information::  No(Patietn not oriented)  Name::     Runner, broadcasting/film/video::  SNF  Relationship::  daughter  Contact Information:     Housing/Transportation Living arrangements for the past 2 months:  [place] of Information:  Adult Children Patient Interpreter Needed:  None Criminal Activity/Legal Involvement Pertinent to Current Situation/Hospitalization:  No - Comment as needed Significant Relationships:  Adult Children, Warehouse manager Lives with:  Facility Resident Do you feel safe going back to the place where you live?  Yes Need for family participation in patient care:  Yes (Comment)  Care giving concerns:  No care giving concerns at the time of assessment.    Social Worker assessment / plan:  LCSW consulted for SNF placement.   Patient is a resident at [facility]  Patient is a 72 y.o. female with medical history significant of dementia, coronary artery disease s/p PCI last in 2012, CHF EF 45%, and CKD III. Admitted for Abnormal ECG.   LCSW met at bedside with patient and daughter, Santiago Glad.  Patient is not oriented. PT recommending SNF. Daughter is agreeable to SNF prior to return to ALF. Patient has [employer] and will need pre auth. According to daughter patient ambulates independently at facility. Patient requires some assistance with bathing and dressing. Daughter reports patient has recently had issues with incontinence.   PLAN: SNF at dc if insurance approves. Facility will need to start auth.    Employment status:  Retired Nurse, adult PT  Recommendations:  [place] / Referral to community resources:     Patient/Family's Response to care:  Daughter is thankful for LCSW visit and dc coordination.   Patient/Family's Understanding of and Emotional Response to Diagnosis, Current Treatment, and Prognosis:  Daughter states that after seeing her mother work with PT she feels rehab is appropriate prior to returning to [facility].   Emotional Assessment Appearance:  Appears stated age Attitude/Demeanor/Rapport:    Affect (typically observed):  Calm Orientation:  Oriented to Self Alcohol / Substance use:  Not Applicable Psych involvement (Current and /or in the community):  No (Comment)  Discharge Needs  Concerns to be addressed:  No discharge needs identified Readmission within the last 30 days:  No Current discharge risk:  None Barriers to Discharge:  No SNF bed, Insurance Authorization   Servando Snare, LCSW 10/18/2017, 3:43 PM

## 2017-10-18 NOTE — Progress Notes (Signed)
Physical Therapy Treatment Patient Details Name: Kaitlyn Simmons MRN: [id number] DOB: August 26, 1945 Today's Date: 10/18/2017    History of Present Illness 72 YO female admitted to ED from SNF on 8/22 for shortness of breath, potential UTI. PMH significant for advanced dementia, CAD, CHF, HTN, CKD III.     PT Comments    PT spoke with pt's daughter at pt's bedside today. Daughter states that pt was ambulating 80-100 feet at a time with RW at [facility] prior to admission, so pt is not at baseline functioning. PT reassessed plan for pt, and will pick her up on caseload to work towards returning to [facility] of ambulation. Care plan goals added.   Pt with decreased engagement in session today, requiring total assist for bed mobility and sitting EOB tasks. Pt with one period of brief agitation while sitting EOB, with look of distress and grabbing at PT's arms. Daughter at bedside states pt does better with mobility in the afternoon/evening. Will continue to follow acutely to progress mobility as able.     Follow Up Recommendations  SNF;Supervision/Assistance - 24 hour(return to [facility])     Equipment Recommendations  None recommended by PT    Recommendations for Other Services       Precautions / Restrictions Precautions Precautions: Fall  Restrictions Weight Bearing Restrictions: No    Mobility  Bed Mobility Overal bed mobility: Needs Assistance Bed Mobility: Supine to Sit;Sit to Supine     Supine to sit: HOB elevated;Total assist;+2 for physical assistance Sit to supine: HOB elevated;Total assist;+2 for physical assistance   General bed mobility comments: Total assist for trunk elevation/lowering, scooting. Use of bed pads to scoot pt to EOB and back into bed. Pt with very little engagement and with eye closing at EOB, with no trunk engagement to stay upright. Pt sat EOB for 2 minutes before fatiguing and becoming slightly agitated.   Transfers Overall transfer level:  (unable to transfer today due to lack of pt engagement and agitation upon sitting on EOB for 2 minutes)                  Ambulation/Gait                 Stairs             Wheelchair Mobility    Modified Rankin (Stroke Patients Only)       Balance Overall balance assessment: Needs assistance Sitting-balance support: Bilateral upper extremity supported;Feet supported Sitting balance-Leahy Scale: Zero Sitting balance - Comments: no trunk engagement today Postural control: Posterior lean                                  Cognition Arousal/Alertness: Awake/alert Behavior During Therapy: Anxious;Agitated(1 period of agitation when sitting EOB, grabbed PT's arms and looked frustrated) Overall Cognitive Status: History of cognitive impairments - at baseline                                 General Comments: Pt with advanced dementia, able to state yes/no when instructed with short commands for mobility       Exercises      General Comments        Pertinent Vitals/Pain Pain Assessment: Faces Faces Pain Scale: No hurt    Home Living  Prior Function            PT Goals (current goals can now be found in the care plan section) Acute Rehab PT Goals PT Goal Formulation: With patient/family Time For Goal Achievement: 10/18/17 Potential to Achieve Goals: Fair    Frequency    Min 3X/week      PT Plan Other (comment)(Daughter at bedside states pt is not at baseline, would benefit from PT acutely to return to PLOF)    Co-evaluation              AM-PAC PT "6 Clicks" Daily Activity  Outcome Measure  Difficulty turning over in bed (including adjusting bedclothes, sheets and blankets)?: Unable Difficulty moving from lying on back to sitting on the side of the bed? : Unable Difficulty sitting down on and standing up from a chair with arms (e.g., wheelchair, bedside commode, etc,.)?:  Unable Help needed moving to and from a bed to chair (including a wheelchair)?: Total Help needed walking in hospital room?: Total Help needed climbing 3-5 steps with a railing? : Total 6 Click Score: 6    End of Session   Activity Tolerance: Patient limited by fatigue Patient left: in bed;with bed alarm set;with call bell/phone within reach;with nursing/sitter in room Nurse Communication: Mobility status PT Visit Diagnosis: Muscle weakness (generalized) (M62.81);Other abnormalities of gait and mobility (R26.89)     Time: 8937-3428 PT Time Calculation (min) (ACUTE ONLY): 13 min  Charges:  $Therapeutic Activity: 8-22 mins                     Georgine Wiltse Conception Chancy, PT, DPT  Pager # [phone]     Kimyah Frein D Quanah Majka 10/18/2017, 1:30 PM

## 2017-10-18 NOTE — Progress Notes (Addendum)
Progress Note  Patient Name: Kaitlyn Simmons Date of Encounter: 10/18/2017  Primary Cardiologist: Dr Acie Fredrickson 2013, Dr Stanford Breed 09/2017   Subjective   Does not want to be examined, pulls the covers back up when moved. Does not answer questions. Breathing quietly on room air, in no obvious distress.   Inpatient Medications    Scheduled Meds: . atorvastatin  80 mg Oral QHS  . clopidogrel  75 mg Oral Daily  . FLUoxetine  40 mg Oral Daily  . furosemide  40 mg Intravenous Daily  . heparin  5,000 Units Subcutaneous Q8H  . memantine  5 mg Oral BID  . risperiDONE  1 mg Oral BID  . sodium chloride flush  3 mL Intravenous Q12H   Continuous Infusions: . sodium chloride 10 mL/hr at 10/18/17 0600  . cefTRIAXone (ROCEPHIN)  IV Stopped (10/17/17 2313)   PRN Meds: sodium chloride, acetaminophen, ondansetron (ZOFRAN) IV, sodium chloride flush   Vital Signs    Vitals:   10/17/17 0706 10/17/17 1328 10/17/17 2049 10/18/17 0501  BP:  104/67 (!) 131/116 (!) 107/53  Pulse:  62 (!) 59 (!) 52  Resp:  18 18 16   Temp:  98 F (36.7 C) 98.2 F (36.8 C) 98 F (36.7 C)  TempSrc:  Oral Oral Oral  SpO2:  97% 99% 97%  Weight: 89.4 kg   88.5 kg  Height:        Intake/Output Summary (Last 24 hours) at 10/18/2017 0736 Last data filed at 10/18/2017 0600 Gross per 24 hour  Intake 220.57 ml  Output 1800 ml  Net -1579.43 ml   Filed Weights   10/16/17 2316 10/17/17 0706 10/18/17 0501  Weight: 90.1 kg 89.4 kg 88.5 kg    Telemetry    Junctional vs sinus bradycardia w/ HR 40s and PVCs - Personally Reviewed  ECG    08/21, sinus brady w/ HR up to 71 due to PVCs  - Personally Reviewed  Physical Exam   General: Well developed, elderly, female appearing in no acute distress. Head: Normocephalic, atraumatic.  Neck: Supple without bruits, JVD not elevated. Lungs:  Resp regular and unlabored, CTA. (pt not taking deep breaths) Heart: Slightly irreg R&R, S1, S2, no S3, S4, soft murmur; no  rub. Abdomen: Soft, non-tender, non-distended with normoactive bowel sounds. No hepatomegaly. No rebound/guarding. No obvious abdominal masses. Extremities: No clubbing, cyanosis, no edema. Distal pedal pulses are 2+ bilaterally. Neuro: Moves all extremities spontaneously. Not answering questions  Labs    Hematology Recent Labs  Lab 10/16/17 1720  WBC 9.1  RBC 4.24  HGB 12.5  HCT 38.2  MCV 90.1  MCH 29.5  MCHC 32.7  RDW 15.1  PLT 228    Chemistry Recent Labs  Lab 10/16/17 1720 10/17/17 1016 10/18/17 0516  NA 141 138 141  K 3.8 3.6 3.6  CL 105 102 107  CO2 24 26 24   GLUCOSE 86 128* 87  BUN 26* 20 19  CREATININE 1.24* 1.21* 1.08*  CALCIUM 10.0 9.2 9.4  PROT 7.4  --   --   ALBUMIN 4.0  --   --   AST 24  --   --   ALT 15  --   --   ALKPHOS 76  --   --   BILITOT 1.0  --   --   GFRNONAA 42* 43* 50*  GFRAA 49* 50* 58*  ANIONGAP 12 10 10      Cardiac Enzymes Recent Labs  Lab 10/16/17 2156 10/17/17 0534 10/17/17 1016  TROPONINI 0.08* 0.08* 0.08*    Recent Labs  Lab 10/16/17 1724  TROPIPOC 0.05     BNP Recent Labs  Lab 10/16/17 1720  BNP 244.4*     DDimer  Recent Labs  Lab 10/16/17 2156  DDIMER 0.54*     Radiology    Dg Chest Port 1 View  Result Date: 10/16/2017 CLINICAL DATA:  Shortness of breath EXAM: PORTABLE CHEST 1 VIEW COMPARISON:  August 16, 2017 FINDINGS: No edema or consolidation. There is cardiomegaly with pulmonary vascularity normal. There is aortic atherosclerosis. No adenopathy. No bone lesions. IMPRESSION: Cardiomegaly. No edema or consolidation. There is aortic atherosclerosis. Aortic Atherosclerosis (ICD10-I70.0). Electronically Signed   By: Lowella Grip III M.D.   On: 10/16/2017 17:10     Cardiac Studies   ECHO:  10/17/2017 - Left ventricle: The cavity size was normal. Systolic function was   severely reduced. The estimated ejection fraction was in the   range of 25% to 30%. Akinesis of the apicalanteroseptal and    apical myocardium. Severe hypokinesis of the basalinferior   myocardium. Doppler parameters are consistent with abnormal left   ventricular relaxation (grade 1 diastolic dysfunction). - Aortic valve: Mildly calcified annulus. Trileaflet; normal   thickness leaflets. Valve area (VTI): 2.18 cm^2. Valve area   (Vmax): 2.49 cm^2. Valve area (Vmean): 2.14 cm^2. - Aortic root: The aortic root was mildly dilated. - Mitral valve: Mildly calcified annulus. There was mild regurgitation. - Left atrium: The atrium was moderately to severely dilated.  Patient Profile     72 y.o. female w/ hx HFrEF (EF 45% 2011), ICM, CAD (s/p STEMI x2 2011/2012; BMS x3 with 2011 RCA  / 2012 mRCA, LAD), HLD, HTN, CKD (Baseline Cr 1-2), and dementia, was admitted 08/21 with SOB>>CHF exacerbation.   Assessment & Plan    1.  Acute on chronic combined systolic and diastolic CHF (congestive heart failure) ([place]) - pt not markedly volume overloaded on exam 08/22 - got IV Lasix 08/22, think ok to change to po Lasix today - resume Lasix at 40 mg qd (was on 20 mg qd) - renal function stable after IV Lasix.  2.  Coronary artery disease due to lipid rich plaque, elevated troponin - no sig elevation in troponin, flat trend - EF decreased from previous measurement (2011) - hx LAD, RCA stents 2011/12 - no acute ECG changes - med rx w/ Plavix, atorva 80 mg  - no BB due to resting bradycardia - discuss adding ACE/ARB w/ MD, but hold off for now as Cr elevated, baseline unclear.  Otherwise, per IM, cards can see prn. Active Problems:   Essential hypertension   CKD (chronic kidney disease), stage III (HCC)   Abnormal ECG   Acute respiratory distress   Acute lower UTI   CHF exacerbation (HCC)   CHF (congestive heart failure) (HCC)   Signed, Rosaria Ferries , PA-C 7:36 AM 10/18/2017 Pager: [phone] As above, patient seen and examined.  Patient does not respond to questions but opens her eyes.  Significantly demented.   She appears to be euvolemic on examination.  As outlined in previous notes she is not a candidate for aggressive cardiac evaluation.  Would continue Lasix 40 mg daily.  Check potassium and renal function in 1 week.  Follow-up with Dr. Cel[facility] will sign off.   Medication Recommendations:  Continue present meds as outlined (lasix, plavix and lipitor) Other recommendations (labs, testing, etc):  No other recommendations Follow up as an outpatient:  Dr  Nahser 3 months Kirk Ruths

## 2017-10-19 DIAGNOSIS — N390 Urinary tract infection, site not specified: Secondary | ICD-10-CM

## 2017-10-19 DIAGNOSIS — I5043 Acute on chronic combined systolic (congestive) and diastolic (congestive) heart failure: Secondary | ICD-10-CM

## 2017-10-19 DIAGNOSIS — R9431 Abnormal electrocardiogram [ECG] [EKG]: Secondary | ICD-10-CM

## 2017-10-19 LAB — BASIC METABOLIC PANEL
ANION GAP: 10 (ref 5–15)
BUN: 17 mg/dL (ref 8–23)
CALCIUM: 9.9 mg/dL (ref 8.9–10.3)
CO2: 25 mmol/L (ref 22–32)
Chloride: 106 mmol/L (ref 98–111)
Creatinine, Ser: 1.01 mg/dL — ABNORMAL HIGH (ref 0.44–1.00)
GFR calc Af Amer: >60 mL/min (ref >60)
GFR calc non Af Amer: 54 mL/min — ABNORMAL LOW (ref >60)
GLUCOSE: 97 mg/dL (ref 70–99)
POTASSIUM: 3.6 mmol/L (ref 3.5–5.1)
Sodium: 141 mmol/L (ref 135–145)

## 2017-10-19 LAB — URINE CULTURE

## 2017-10-19 MED ORDER — LIP MEDEX EX OINT
TOPICAL_OINTMENT | CUTANEOUS | Status: AC
  Administered 2017-10-19: 19:00:00
  Filled 2017-10-19: qty 7

## 2017-10-19 NOTE — Progress Notes (Signed)
PROGRESS NOTE    Kaitlyn Simmons  ZOX:[id number] DOB: [birth year] DOA: 10/16/2017 PCP: Arlis Porta., MD  Brief Narrative: 72 year old female who presented with dyspnea. She does have significant past medical history for coronary artery disease with ischemic cardiomyopathy, ejection fraction 45%, coronary artery disease, chronic kidney disease stage III and dementia. Patient reported dyspnea at the skilled nursing facility, that prompted her to be transfer to the hospital. On her initial physical examination her blood pressure was 103/80, heart rate 62, respiratory rate 20, oxygen saturation 99%.Her lungs no significant rales or wheezing, heart S1-S2 present and rhythmic, abdomen soft nontender, no significant lower extremity edema.  Sodium 141, potassium 3.8, chloride 105, bicarb 24 glucose 86, BUN 26, creatinine 1.24, BNP 244, white count 9.1, hemoglobin 12.5, hematocrit 38.2, platelets 228, urinalysis 21-50 white cells, specific gravity 1.014.  Her chest x-ray had increased interstitial markings bilaterally.  EKG sinus rhythm, left axis deviation, positive PACs and PVCs, poor R wave progression.  Patient was admitted to the hospital with the working diagnosis of acute decompensation of chronic diastolic heart failure,complicated by a urinary tract infection  Assessment & Plan:   Active Problems:   Coronary artery disease due to lipid rich plaque   Essential hypertension   CKD (chronic kidney disease), stage III (HCC)   Abnormal ECG   Acute on chronic combined systolic and diastolic CHF (congestive heart failure) (HCC)   Acute respiratory distress   Acute lower UTI   CHF exacerbation (HCC)   CHF (congestive heart failure) (HCC)  Acute on chronic combined systolic and diastolic heart failure Last echo on record in 2013 EF 40 to 45% with grade 1 diastolic dysfunction moderate hypokinesia in mid-distal inferior myocardium.  Patient was started on IV Lasix 40 mg daily, will  continue for now.  Good diuresis. Monitor renal function closely. Strict I&O, Monitor daily weight low salt diet. Get ECHO, Monitor EKG   Elevated troponin  EKG with no signs of ischemic changes, likely demands ischemia.  Await echocardiogram, however given comorbidities unlikely will need further ischemic work-up. Troponin trend has been flat.  Will discontinue heparin drip.  Cardiology was consulted will await recommendation.   UTI Urine concerning of UTI with large WBC and positive nitrates Patient started on empiric ceftriaxone, urine culture pending  CKD stage III Creatinine seems to be at baseline Avoid nephrotoxic agent and hypotension Continue to monitor renal function  Hypertension BP stable Patient on Lasix, will resume lisinopril. Her BP closely  CAD  Asymptomatic, continue plavix    DVT prophylaxis:HEPARIN Code Status:FULL Family Communication:NONE Disposition PlanTBD  Consultants: CARDIOLOGY  Procedures:NONE Antimicrobials ROCEPHIN Subjective: Resting in bed she does not appear to be in any distress she is nonverbal however she makes good eye contact.   Objective: Vitals:   10/18/17 2128 10/19/17 0500 10/19/17 0508 10/19/17 1319  BP: 116/66  102/71 99/79  Pulse: 81  (!) 53 (!) 52  Resp: 20  14 16   Temp: 98.3 F (36.8 C)  (!) 97.4 F (36.3 C) 97.9 F (36.6 C)  TempSrc: Oral  Oral Oral  SpO2: 97%  96% 97%  Weight:  87.9 kg    Height:        Intake/Output Summary (Last 24 hours) at 10/19/2017 1430 Last data filed at 10/19/2017 1000 Gross per 24 hour  Intake 360 ml  Output 1100 ml  Net -740 ml   Filed Weights   10/17/17 0706 10/18/17 0501 10/19/17 0500  Weight: 89.4 kg 88.5 kg 87.9 kg  Examination:  General exam: Appears calm and comfortable  Respiratory system: Clear to auscultation. Respiratory effort normal. Cardiovascular system: S1 & S2 heard, RRR. No JVD, murmurs, rubs, gallops or clicks. No pedal edema. Gastrointestinal  system: Abdomen is nondistended, soft and nontender. No organomegaly or masses felt. Normal bowel sounds heard. Extremities: Trace edema bilaterally. Skin: No rashes, lesions or ulcers    Data Reviewed: I have personally reviewed following labs and imaging studies  CBC: Recent Labs  Lab 10/16/17 1720 10/18/17 1323  WBC 9.1 6.3  NEUTROABS 5.7  --   HGB 12.5 13.3  HCT 38.2 42.4  MCV 90.1 93.8  PLT 228 169   Basic Metabolic Panel: Recent Labs  Lab 10/16/17 1720 10/17/17 1016 10/18/17 0516 10/19/17 0542  NA 141 138 141 141  K 3.8 3.6 3.6 3.6  CL 105 102 107 106  CO2 24 26 24 25   GLUCOSE 86 128* 87 97  BUN 26* 20 19 17   CREATININE 1.24* 1.21* 1.08* 1.01*  CALCIUM 10.0 9.2 9.4 9.9  MG  --   --  2.1  --    GFR: Estimated Creatinine Clearance: 51.1 mL/min (A) (by C-G formula based on SCr of 1.01 mg/dL (H)). Liver Function Tests: Recent Labs  Lab 10/16/17 1720  AST 24  ALT 15  ALKPHOS 76  BILITOT 1.0  PROT 7.4  ALBUMIN 4.0   No results for input(s): LIPASE, AMYLASE in the last 168 hours. No results for input(s): AMMONIA in the last 168 hours. Coagulation Profile: Recent Labs  Lab 10/17/17 0218  INR 1.06   Cardiac Enzymes: Recent Labs  Lab 10/16/17 2156 10/17/17 0534 10/17/17 1016  TROPONINI 0.08* 0.08* 0.08*   BNP (last 3 results) No results for input(s): PROBNP in the last 8760 hours. HbA1C: No results for input(s): HGBA1C in the last 72 hours. CBG: No results for input(s): GLUCAP in the last 168 hours. Lipid Profile: No results for input(s): CHOL, HDL, LDLCALC, TRIG, CHOLHDL, LDLDIRECT in the last 72 hours. Thyroid Function Tests: Recent Labs    10/17/17 0534  TSH 3.106   Anemia Panel: No results for input(s): VITAMINB12, FOLATE, FERRITIN, TIBC, IRON, RETICCTPCT in the last 72 hours. Sepsis Labs: No results for input(s): PROCALCITON, LATICACIDVEN in the last 168 hours.  Recent Results (from the past 240 hour(s))  Urine Culture     Status:  Abnormal   Collection Time: 10/16/17  9:37 PM  Result Value Ref Range Status   Specimen Description   Final    URINE, CLEAN CATCH Performed at [facility], [place] [address] [place], [place] [address]    Special Requests   Final    NONE Performed at [facility], [place] [address] [place], [place] [address]    Culture >=100,000 COLONIES/mL ESCHERICHIA COLI (A)  Final   Report Status 10/19/2017 FINAL  Final   Organism ID, Bacteria ESCHERICHIA COLI (A)  Final      Susceptibility   Escherichia coli - MIC*    AMPICILLIN 4 SENSITIVE Sensitive     CEFAZOLIN <=4 SENSITIVE Sensitive     CEFTRIAXONE <=1 SENSITIVE Sensitive     CIPROFLOXACIN <=0.25 SENSITIVE Sensitive     GENTAMICIN <=1 SENSITIVE Sensitive     IMIPENEM <=0.25 SENSITIVE Sensitive     NITROFURANTOIN <=16 SENSITIVE Sensitive     TRIMETH/SULFA <=20 SENSITIVE Sensitive     AMPICILLIN/SULBACTAM 4 SENSITIVE Sensitive     PIP/TAZO <=4 SENSITIVE Sensitive     Extended ESBL NEGATIVE Sensitive     * >=  100,000 COLONIES/mL ESCHERICHIA COLI  MRSA PCR Screening     Status: None   Collection Time: 10/17/17  4:09 PM  Result Value Ref Range Status   MRSA by PCR NEGATIVE NEGATIVE Final    Comment:        The GeneXpert MRSA Assay (FDA approved for NASAL specimens only), is one component of a comprehensive MRSA colonization surveillance program. It is not intended to diagnose MRSA infection nor to guide or monitor treatment for MRSA infections. Performed at [facility], [place] [address], [place], [place] [address]          Radiology Studies: No results found.      Scheduled Meds: . atorvastatin  80 mg Oral QHS  . cephALEXin  500 mg Oral Q6H  . clopidogrel  75 mg Oral Daily  . FLUoxetine  40 mg Oral Daily  . furosemide  40 mg Oral q morning - 10a  . heparin  5,000 Units Subcutaneous Q8H  . memantine  5 mg Oral BID  . risperiDONE  1 mg Oral BID  . sodium chloride  flush  3 mL Intravenous Q12H   Continuous Infusions: . sodium chloride 10 mL/hr at 10/18/17 0600     LOS: 2 days       Georgette Shell, MD [facility]   If 7PM-7AM, please contact night-coverage [email or web address] Password [email or web address] 10/19/2017, 2:30 PM

## 2017-10-20 DIAGNOSIS — R0603 Acute respiratory distress: Secondary | ICD-10-CM

## 2017-10-20 MED ORDER — SODIUM CHLORIDE 0.9 % IV SOLN
INTRAVENOUS | Status: AC
  Administered 2017-10-20: 10:00:00 via INTRAVENOUS

## 2017-10-20 NOTE — Progress Notes (Signed)
Manual BP 96/60, NP on call notified. No new orders at this time. Will continue to monitor.

## 2017-10-20 NOTE — Progress Notes (Signed)
PROGRESS NOTE    Kaitlyn Simmons  BBC:[id number] DOB: 1945/10/06 DOA: 10/16/2017 PCP: Arlis Porta., MD   Brief Narrative:  72 year old female who presented with dyspnea. She does have significant past medical history for coronary artery disease with ischemic cardiomyopathy, ejection fraction 45%, coronaryarterydisease, chronic kidney diseasestage III and dementia. Patient reported dyspnea at the skilled nursing facility, that prompted herto betransfer to the hospital.On her initial physical examinationherblood pressurewas103/80, heart rate 62, respiratory rate 20, oxygen saturation 99%.Her lungsnosignificant rales or wheezing, heart S1-S2 present and rhythmic, abdomen soft nontender, no significant lower extremity edema.Sodium 141, potassium 3.8, chloride 105, bicarb 24 glucose 86, BUN 26, creatinine 1.24, BNP 244, white count 9.1, hemoglobin 12.5, hematocrit 38.2, platelets 228,urinalysis 21-50 white cells,specific gravity 1.014.Her chest x-ray had increasedinterstitial markings bilaterally.EKG sinus rhythm, left axis deviation, positive PACs and PVCs, poor R wave progression.  Patient was admitted to the hospital withtheworking diagnosis of acute decompensation of chronic diastolic heart failure,complicated by a urinary tract infection  Assessment & Plan:   Active Problems:   Coronary artery disease due to lipid rich plaque   Essential hypertension   CKD (chronic kidney disease), stage III (HCC)   Abnormal ECG   Acute on chronic combined systolic and diastolic CHF (congestive heart failure) (HCC)   Acute respiratory distress   Acute lower UTI   CHF exacerbation (HCC)   CHF (congestive heart failure) (HCC)  Acute on chronic combined systolic and diastolic heart failure Last echo on record in 2013EF 40 to 45% with grade 1 diastolic dysfunction moderate hypokinesiain mid-distal inferior myocardium. Patient was started on IV Lasix 40 mg daily, however  blood pressures were soft and became more softer after status starting the Lasix.  And she does appear to be on the dry side.  I will hold the Lasix today and give her slow IV hydration.   Echo-severely decreased systolic function with ejection fraction 25 to 30%.  Severe hypokinesis of the basal inferior myocardium.  Grade 1 diastolic dysfunction.  Elevated troponin  EKG with no signs of ischemic changes, likely demands ischemia. Cardiology has seen the patient and recommends medical management.  They recommended patient to be discharged on 40 mg Lasix daily prior to admission she was on 20 mg of Lasix daily.  UTI positive E. coli sensitive to Rocephin.  CKD stage III Creatinine seems to be at baseline Avoid nephrotoxic agent and hypotension Continue to monitor renal function  Hypertension blood pressure soft hold Lasix DC lisinopril.  DVT prophylaxis heparin Code Status: Full code Family Communication: None available Disposition Plan: Plan to discharge to SNF when ready Consultants:  Cardiology Procedures: None Antimicrobials Keflex Subjective: She is resting in bed in no acute distress he does not speak a lot.  But it looks ensure I and tries to follow a conversation.  Objective: Vitals:   10/19/17 2104 10/20/17 0442 10/20/17 0554 10/20/17 0557  BP: 110/80 (!) 97/56 (!) 82/62 96/60  Pulse: (!) 108 (!) 41  (!) 46  Resp: 18 18    Temp: 98.9 F (37.2 C) 98 F (36.7 C)    TempSrc:      SpO2: 97% 95%    Weight:  86.9 kg    Height:        Intake/Output Summary (Last 24 hours) at 10/20/2017 0927 Last data filed at 10/19/2017 1736 Gross per 24 hour  Intake 600 ml  Output -  Net 600 ml   Filed Weights   10/18/17 0501 10/19/17 0500 10/20/17 0442  Weight: 88.5 kg 87.9 kg 86.9 kg    Examination:  General exam: Appears calm and comfortable  Respiratory system: Crackles to auscultation. Respiratory effort normal. Cardiovascular system: S1 & S2 heard, RRR. No JVD,  murmurs, rubs, gallops or clicks. No pedal edema. Gastrointestinal system: Abdomen is nondistended, soft and nontender. No organomegaly or masses felt. Normal bowel sounds heard. Central nervous system: Alert and oriented. No focal neurological deficits. Extremities: Symmetric 5 x 5 power. Skin: No rashes, lesions or ulcers Psychiatry: Judgement and insight appear normal. Mood & affect appropriate.     Data Reviewed: I have personally reviewed following labs and imaging studies  CBC: Recent Labs  Lab 10/16/17 1720 10/18/17 1323  WBC 9.1 6.3  NEUTROABS 5.7  --   HGB 12.5 13.3  HCT 38.2 42.4  MCV 90.1 93.8  PLT 228 053   Basic Metabolic Panel: Recent Labs  Lab 10/16/17 1720 10/17/17 1016 10/18/17 0516 10/19/17 0542  NA 141 138 141 141  K 3.8 3.6 3.6 3.6  CL 105 102 107 106  CO2 24 26 24 25   GLUCOSE 86 128* 87 97  BUN 26* 20 19 17   CREATININE 1.24* 1.21* 1.08* 1.01*  CALCIUM 10.0 9.2 9.4 9.9  MG  --   --  2.1  --    GFR: Estimated Creatinine Clearance: 50.7 mL/min (A) (by C-G formula based on SCr of 1.01 mg/dL (H)). Liver Function Tests: Recent Labs  Lab 10/16/17 1720  AST 24  ALT 15  ALKPHOS 76  BILITOT 1.0  PROT 7.4  ALBUMIN 4.0   No results for input(s): LIPASE, AMYLASE in the last 168 hours. No results for input(s): AMMONIA in the last 168 hours. Coagulation Profile: Recent Labs  Lab 10/17/17 0218  INR 1.06   Cardiac Enzymes: Recent Labs  Lab 10/16/17 2156 10/17/17 0534 10/17/17 1016  TROPONINI 0.08* 0.08* 0.08*   BNP (last 3 results) No results for input(s): PROBNP in the last 8760 hours. HbA1C: No results for input(s): HGBA1C in the last 72 hours. CBG: No results for input(s): GLUCAP in the last 168 hours. Lipid Profile: No results for input(s): CHOL, HDL, LDLCALC, TRIG, CHOLHDL, LDLDIRECT in the last 72 hours. Thyroid Function Tests: No results for input(s): TSH, T4TOTAL, FREET4, T3FREE, THYROIDAB in the last 72 hours. Anemia  Panel: No results for input(s): VITAMINB12, FOLATE, FERRITIN, TIBC, IRON, RETICCTPCT in the last 72 hours. Sepsis Labs: No results for input(s): PROCALCITON, LATICACIDVEN in the last 168 hours.  Recent Results (from the past 240 hour(s))  Urine Culture     Status: Abnormal   Collection Time: 10/16/17  9:37 PM  Result Value Ref Range Status   Specimen Description   Final    URINE, CLEAN CATCH Performed at [facility], [place] [address], [place], [place] [address]    Special Requests   Final    NONE Performed at [facility], [place] [address], [place], [place] [address]    Culture >=100,000 COLONIES/mL ESCHERICHIA COLI (A)  Final   Report Status 10/19/2017 FINAL  Final   Organism ID, Bacteria ESCHERICHIA COLI (A)  Final      Susceptibility   Escherichia coli - MIC*    AMPICILLIN 4 SENSITIVE Sensitive     CEFAZOLIN <=4 SENSITIVE Sensitive     CEFTRIAXONE <=1 SENSITIVE Sensitive     CIPROFLOXACIN <=0.25 SENSITIVE Sensitive     GENTAMICIN <=1 SENSITIVE Sensitive     IMIPENEM <=0.25 SENSITIVE Sensitive     NITROFURANTOIN <=16  SENSITIVE Sensitive     TRIMETH/SULFA <=20 SENSITIVE Sensitive     AMPICILLIN/SULBACTAM 4 SENSITIVE Sensitive     PIP/TAZO <=4 SENSITIVE Sensitive     Extended ESBL NEGATIVE Sensitive     * >=100,000 COLONIES/mL ESCHERICHIA COLI  MRSA PCR Screening     Status: None   Collection Time: 10/17/17  4:09 PM  Result Value Ref Range Status   MRSA by PCR NEGATIVE NEGATIVE Final    Comment:        The GeneXpert MRSA Assay (FDA approved for NASAL specimens only), is one component of a comprehensive MRSA colonization surveillance program. It is not intended to diagnose MRSA infection nor to guide or monitor treatment for MRSA infections. Performed at [facility], [place] [address] [place], [place] [address]          Radiology Studies: No results found.      Scheduled Meds: . atorvastatin  80 mg  Oral QHS  . cephALEXin  500 mg Oral Q6H  . clopidogrel  75 mg Oral Daily  . FLUoxetine  40 mg Oral Daily  . heparin  5,000 Units Subcutaneous Q8H  . memantine  5 mg Oral BID  . risperiDONE  1 mg Oral BID  . sodium chloride flush  3 mL Intravenous Q12H   Continuous Infusions: . sodium chloride 10 mL/hr at 10/18/17 0600  . sodium chloride       LOS: 3 days     Georgette Shell, MD [facility]   If 7PM-7AM, please contact night-coverage [email or web address] Password [facility] 10/20/2017, 9:27 AM

## 2017-10-21 ENCOUNTER — Encounter (HOSPITAL_COMMUNITY): Payer: Self-pay

## 2017-10-21 MED ORDER — SODIUM CHLORIDE 0.9 % IV SOLN
INTRAVENOUS | Status: DC
  Administered 2017-10-21 – 2017-10-22 (×3): via INTRAVENOUS

## 2017-10-21 MED ORDER — FUROSEMIDE 20 MG PO TABS: ORAL_TABLET | ORAL | 11 refills | Status: DC

## 2017-10-21 MED ORDER — POTASSIUM CHLORIDE ER 10 MEQ PO TBCR: EXTENDED_RELEASE_TABLET | ORAL | 0 refills | Status: DC

## 2017-10-21 NOTE — Care Management Note (Signed)
Case Management Note  Patient Details  Name: Kaitlyn Simmons MRN: [id number] Date of Birth: [birth year]  Subjective/Objective:  PT -recc SNF. CSW following for SNF. IVF @ 100.                  Action/Plan:d/c SNF.   Expected Discharge Date:  10/19/17               Expected Discharge Plan:  Skilled Nursing Facility  In-House Referral:  Clinical Social Work  Discharge planning Services  CM Consult  Post Acute Care Choice:    Choice offered to:     DME Arranged:    DME Agency:     HH Arranged:    [place] Agency:     Status of Service:  In process, will continue to follow  If discussed at Long Length of Stay Meetings, dates discussed:    Additional Comments:  Dessa Phi, RN 10/21/2017, 4:06 PM

## 2017-10-21 NOTE — Care Management Important Message (Signed)
Important Message  Patient Details  Name: Kaitlyn Simmons MRN: [id number] Date of Birth: [birth year]   Medicare Important Message Given:  Yes    Kerin Salen 10/21/2017, 1:23 [place] Message  Patient Details  Name: Kaitlyn Simmons MRN: [id number] Date of Birth: 03-Jul-1945   Medicare Important Message Given:  Yes    Kerin Salen 10/21/2017, 1:23 PM

## 2017-10-21 NOTE — Progress Notes (Signed)
Pt BP soft this am, 92/70, NP on call notified. No new orders at this time. Will continue to monitor.

## 2017-10-21 NOTE — Progress Notes (Signed)
Physical Therapy Treatment Patient Details Name: Kaitlyn Simmons MRN: [id number] DOB: September 09, 1945 Today's Date: 10/21/2017    History of Present Illness 72 YO female admitted to ED from SNF on 8/22 for shortness of breath, potential UTI. PMH significant for advanced dementia, CAD, CHF, HTN, CKD III.     PT Comments    Pt with very little engagement with PT during session, but able to maintain brief eye contact. Pt with improved sitting balance this session, but otherwise requires max to total assist for bed mobility and transfer tasks. Pt completely reliant on PT support while standing. Last session, daughter stated that the pt is more active, alert, and mobile in the late afternoon and evening. Will continue to follow acutely to progress mobility as able. Current PT plan remains appropriate after D/C     Follow Up Recommendations  SNF;Supervision/Assistance - 24 hour     Equipment Recommendations  None recommended by PT    Recommendations for Other Services       Precautions / Restrictions Precautions Precautions: Fall Restrictions Weight Bearing Restrictions: No    Mobility  Bed Mobility Overal bed mobility: Needs Assistance Bed Mobility: Supine to Sit;Sit to Supine     Supine to sit: HOB elevated;Total assist;+2 for physical assistance Sit to supine: HOB elevated;Total assist;+2 for physical assistance   General bed mobility comments: Total assist for trunk elevation/lowering, scooting. Pt with continued difficulty with engagement and eye closing at EOB. Able to sit with UE support and no PT trunk support today. Pt sat EOB for 4 minutes while NT bathed pt.   Transfers Overall transfer level: Needs assistance(unable to transfer today due to lack of pt engagement and agitation upon sitting on EOB for 2 minutes) Equipment used: Rolling walker (2 wheeled) Transfers: Sit to/from Stand Sit to Stand: +2 physical assistance;Max assist         General transfer comment: Max  assist +2 for sit to stand, requiring max steadying and trunk support while standing. Pt stood for 2 minutes for bed to be changed. Unable to stand unsupported, posterior lean with standing. Very little eye contact engagement with PT.   Ambulation/Gait                 Stairs             Wheelchair Mobility    Modified Rankin (Stroke Patients Only)       Balance Overall balance assessment: Needs assistance Sitting-balance support: Feet supported Sitting balance-Leahy Scale: Poor Sitting balance - Comments: sat for approximately 1 minute with hands in lap, feet supported, and no PT support for balance.  Postural control: Posterior lean(with fatigue ) Standing balance support: Bilateral upper extremity supported Standing balance-Leahy Scale: Zero Standing balance comment: required max-total PT assist to stand and remain standing.                             Cognition Arousal/Alertness: Awake/alert Behavior During Therapy: Anxious;Agitated(1 period of agitation when sitting EOB, grabbed PT's arms and looked frustrated) Overall Cognitive Status: History of cognitive impairments - at baseline                                 General Comments: Pt with advanced dementia, able to state yes/no when instructed with short commands for mobility       Exercises      General Comments  Pertinent Vitals/Pain Pain Assessment: No/denies pain Faces Pain Scale: No hurt    Home Living                      Prior Function            PT Goals (current goals can now be found in the care plan section) Acute Rehab PT Goals PT Goal Formulation: With patient/family Time For Goal Achievement: 10/18/17 Potential to Achieve Goals: Fair Progress towards PT goals: Not progressing toward goals - comment(very little pt-initated or assisted mobility last 2 sessions.)    Frequency    Min 3X/week      PT Plan Current plan remains  appropriate    Co-evaluation              AM-PAC PT "6 Clicks" Daily Activity  Outcome Measure  Difficulty turning over in bed (including adjusting bedclothes, sheets and blankets)?: Unable Difficulty moving from lying on back to sitting on the side of the bed? : Unable Difficulty sitting down on and standing up from a chair with arms (e.g., wheelchair, bedside commode, etc,.)?: Unable Help needed moving to and from a bed to chair (including a wheelchair)?: Total Help needed walking in hospital room?: Total Help needed climbing 3-5 steps with a railing? : Total 6 Click Score: 6    End of Session   Activity Tolerance: Patient limited by lethargy Patient left: in bed;with bed alarm set;with nursing/sitter in room;with call bell/phone within reach Nurse Communication: Mobility status PT Visit Diagnosis: Muscle weakness (generalized) (M62.81);Other abnormalities of gait and mobility (R26.89)     Time: 8453-6468 PT Time Calculation (min) (ACUTE ONLY): 22 min  Charges:  $Therapeutic Activity: 8-22 mins                     Daysean Tinkham Conception Chancy, PT, DPT  Pager # [phone]     Lovelyn Sheeran D Asher Babilonia 10/21/2017, 1:56 PM

## 2017-10-21 NOTE — Care Management Note (Signed)
Case Management Note  Patient Details  Name: YANELY MAST MRN: [id number] Date of Birth: 12/17/1945  Subjective/Objective:  D/c plan SNF-CSW following.                  Action/Plan:d/c SNF.   Expected Discharge Date:  10/19/17               Expected Discharge Plan:  Skilled Nursing Facility  In-House Referral:  Clinical Social Work  Discharge planning Services  CM Consult  Post Acute Care Choice:    Choice offered to:     DME Arranged:    DME Agency:     HH Arranged:    [place] Agency:     Status of Service:  In process, will continue to follow  If discussed at Long Length of Stay Meetings, dates discussed:    Additional Comments:  Dessa Phi, RN 10/21/2017, 11:31 AM

## 2017-10-22 DIAGNOSIS — N183 Chronic kidney disease, stage 3 (moderate): Secondary | ICD-10-CM

## 2017-10-22 NOTE — Discharge Summary (Signed)
Physician Discharge Summary  Kaitlyn Simmons SPQ:[id number] DOB: [birth year] DOA: 10/16/2017  PCP: Arlis Porta., MD  Admit date: 10/16/2017 Discharge date: 10/22/2017  Admitted From: NH Disposition:NH  Recommendations for Outpatient Follow-up:  1. Follow up with PCP in 1-2 weeks 2. Please obtain BMP/CBC in one week  [place] Equipment/Devices NONE  Discharge Condition STABLE CODE STATUS FULL Diet recommendation: CARDIAC Brief/Interim Summary:72 year old female who presented with dyspnea. She does have significant past medical history for coronary artery disease with ischemic cardiomyopathy, ejection fraction 45%, coronary artery disease, chronic kidney disease stage III and dementia. Patient reported dyspnea at the skilled nursing facility, that prompted her to be transfer to the hospital. On her initial physical examination her blood pressure was 103/80, heart rate 62, respiratory rate 20, oxygen saturation 99%.Her lungs no significant rales or wheezing, heart S1-S2 present and rhythmic, abdomen soft nontender, no significant lower extremity edema.  Sodium 141, potassium 3.8, chloride 105, bicarb 24 glucose 86, BUN 26, creatinine 1.24, BNP 244, white count 9.1, hemoglobin 12.5, hematocrit 38.2, platelets 228, urinalysis 21-50 white cells, specific gravity 1.014.  Her chest x-ray had increased interstitial markings bilaterally.  EKG sinus rhythm, left axis deviation, positive PACs and PVCs, poor R wave progression.  Patient was admitted to the hospital with the working diagnosis of acute decompensation of chronic diastolic heart failure,complicated by a urinary tract infection.  Discharge Diagnoses:  Active Problems:   Coronary artery disease due to lipid rich plaque   Essential hypertension   CKD (chronic kidney disease), stage III (HCC)   Abnormal ECG   Acute on chronic combined systolic and diastolic CHF (congestive heart failure) (HCC)   Acute respiratory  distress   Acute lower UTI   CHF exacerbation (HCC)   CHF (congestive heart failure) ([place])  1.  Acute on chronic systolic heart failure exacerbation/ischemic cardiomyopathy.  Patient was admitted to the medical ward, she was placed on a remote telemetry monitor, she received diuresis with IV furosemide, and negative fluid balance was achieved (-1,832 ml) with improvement of her symptoms.  Further work-up with echocardiography showed worsening on LV systolic function down to 25 to 35% with akinesis of the apical and lateral septal/apical myocardium.  Severe hypokinesis of the basilar inferior myocardium.  Continue antiplatelet therapy with clopidogrel.dc ace due to soft bp.  2.  Stage III chronic kidney disease.  Kidney function remained stable with serum creatinine 1.0-1.2, discharge potassium 3.6, serum bicarb 24.restart furosemide at 20 mg 8/30 due to soft bp.  3.  E. coli urinary tract infection, present on admission.  Patient was placed on antibiotic therapy IV ceftriaxone with improvement of her symptoms.finished keflex course.  4.  Dementia.  No significant agitation, patient will continue fluoxetine, memantine, risperidone, and melatonin.    Discharge Instructions  Discharge Instructions    Diet - low sodium heart healthy   Complete by:  As directed    Diet - low sodium heart healthy   Complete by:  As directed    Discharge instructions   Complete by:  As directed    Please follow with primary care in 7 days.   Increase activity slowly   Complete by:  As directed    Increase activity slowly   Complete by:  As directed      Allergies as of 10/22/2017   No Known Allergies     Medication List    STOP taking these medications   lisinopril 2.5 MG tablet Commonly known as:  PRINIVIL,ZESTRIL  TAKE these medications   acetaminophen 500 MG tablet Commonly known as:  TYLENOL Take 500 mg by mouth 2 (two) times daily as needed (knee pain).   atorvastatin 80 MG  tablet Commonly known as:  LIPITOR Take 1 tablet (80 mg total) by mouth at bedtime.   clopidogrel 75 MG tablet Commonly known as:  PLAVIX Take 1 tablet (75 mg total) by mouth daily.   dimethicone-zinc oxide cream Apply 1 application topically daily. And as needed   FLUoxetine 10 MG capsule Commonly known as:  PROZAC Take 40 mg by mouth daily.   furosemide 20 MG tablet Commonly known as:  LASIX Start 8/30 What changed:    medication strength  how much to take  how to take this  when to take this  additional instructions   Melatonin 5 MG Tabs Take 5 mg by mouth at bedtime.   memantine 5 MG tablet Commonly known as:  NAMENDA Take 5 mg by mouth 2 (two) times daily.   [place] 200-200-20 MG/5ML suspension Generic drug:  alum & mag hydroxide-simeth Take 30 mLs by mouth every 6 (six) hours as needed for indigestion or heartburn.   nitroGLYCERIN 0.4 MG SL tablet Commonly known as:  NITROSTAT Place 1 tablet (0.4 mg total) under the tongue every 5 (five) minutes as needed for chest pain.   nystatin powder Generic drug:  nystatin Apply 1 application topically 2 (two) times daily.   OCUVITE ADULT 50+ Caps Take 1 capsule by mouth every morning.   potassium chloride 10 MEQ tablet Commonly known as:  K-DUR Start taking it 8/30 with lasix   risperiDONE 1 MG/ML oral solution Commonly known as:  RISPERDAL Take 1 mL by mouth 2 (two) times daily.      Follow-up Information    Nahser, Wonda Cheng, MD Follow up.   Specialty:  Cardiology Why:  The office will call. Contact information: [place] [address] [phone]        Arlis Porta., MD Follow up in 1 week(s).   Specialty:  Family Medicine         No Known Allergies  Consultations:     Procedures/Studies: Dg Chest Port 1 View  Result Date: 10/16/2017 CLINICAL DATA:  Shortness of breath EXAM: PORTABLE CHEST 1 VIEW COMPARISON:  August 16, 2017 FINDINGS: No edema or  consolidation. There is cardiomegaly with pulmonary vascularity normal. There is aortic atherosclerosis. No adenopathy. No bone lesions. IMPRESSION: Cardiomegaly. No edema or consolidation. There is aortic atherosclerosis. Aortic Atherosclerosis (ICD10-I70.0). Electronically Signed   By: Lowella Grip III M.D.   On: 10/16/2017 17:10    (Echo, Carotid, EGD, Colonoscopy, ERCP)    Subjective:   Discharge Exam: Vitals:   10/22/17 0511 10/22/17 1332  BP: 116/66 (!) 110/47  Pulse: (!) 52 (!) 59  Resp: 18 18  Temp: 97.8 F (36.6 C) 97.7 F (36.5 C)  SpO2: 95% 98%   Vitals:   10/21/17 1348 10/21/17 2056 10/22/17 0511 10/22/17 1332  BP: (!) 124/92 119/68 116/66 (!) 110/47  Pulse: (!) 54 (!) 58 (!) 52 (!) 59  Resp:  18 18 18   Temp: 98.1 F (36.7 C) 98.5 F (36.9 C) 97.8 F (36.6 C) 97.7 F (36.5 C)  TempSrc: Oral  Oral Oral  SpO2: 96% 97% 95% 98%  Weight:   88.2 kg   Height:        General: Pt is alert, awake, not in acute distress Cardiovascular: RRR, S1/S2 +, no  rubs, no gallops Respiratory: CTA bilaterally, no wheezing, no rhonchi Abdominal: Soft, NT, ND, bowel sounds + Extremities: no edema, no cyanosis    The results of significant diagnostics from this hospitalization (including imaging, microbiology, ancillary and laboratory) are listed below for reference.     Microbiology: Recent Results (from the past 240 hour(s))  Urine Culture     Status: Abnormal   Collection Time: 10/16/17  9:37 PM  Result Value Ref Range Status   Specimen Description   Final    URINE, CLEAN CATCH Performed at [facility], [place] [address], [place], [place] [address]    Special Requests   Final    NONE Performed at [facility], [place] [address] [place], [place] [address]    Culture >=100,000 COLONIES/mL ESCHERICHIA COLI (A)  Final   Report Status 10/19/2017 FINAL  Final   Organism ID, Bacteria ESCHERICHIA COLI (A)  Final      Susceptibility    Escherichia coli - MIC*    AMPICILLIN 4 SENSITIVE Sensitive     CEFAZOLIN <=4 SENSITIVE Sensitive     CEFTRIAXONE <=1 SENSITIVE Sensitive     CIPROFLOXACIN <=0.25 SENSITIVE Sensitive     GENTAMICIN <=1 SENSITIVE Sensitive     IMIPENEM <=0.25 SENSITIVE Sensitive     NITROFURANTOIN <=16 SENSITIVE Sensitive     TRIMETH/SULFA <=20 SENSITIVE Sensitive     AMPICILLIN/SULBACTAM 4 SENSITIVE Sensitive     PIP/TAZO <=4 SENSITIVE Sensitive     Extended ESBL NEGATIVE Sensitive     * >=100,000 COLONIES/mL ESCHERICHIA COLI  MRSA PCR Screening     Status: None   Collection Time: 10/17/17  4:09 PM  Result Value Ref Range Status   MRSA by PCR NEGATIVE NEGATIVE Final    Comment:        The GeneXpert MRSA Assay (FDA approved for NASAL specimens only), is one component of a comprehensive MRSA colonization surveillance program. It is not intended to diagnose MRSA infection nor to guide or monitor treatment for MRSA infections. Performed at [facility], [place] [address], [place], [place] [address]      Labs: BNP (last 3 results) Recent Labs    10/16/17 1720  BNP 703.5*   Basic Metabolic Panel: Recent Labs  Lab 10/16/17 1720 10/17/17 1016 10/18/17 0516 10/19/17 0542  NA 141 138 141 141  K 3.8 3.6 3.6 3.6  CL 105 102 107 106  CO2 24 26 24 25   GLUCOSE 86 128* 87 97  BUN 26* 20 19 17   CREATININE 1.24* 1.21* 1.08* 1.01*  CALCIUM 10.0 9.2 9.4 9.9  MG  --   --  2.1  --    Liver Function Tests: Recent Labs  Lab 10/16/17 1720  AST 24  ALT 15  ALKPHOS 76  BILITOT 1.0  PROT 7.4  ALBUMIN 4.0   No results for input(s): LIPASE, AMYLASE in the last 168 hours. No results for input(s): AMMONIA in the last 168 hours. CBC: Recent Labs  Lab 10/16/17 1720 10/18/17 1323  WBC 9.1 6.3  NEUTROABS 5.7  --   HGB 12.5 13.3  HCT 38.2 42.4  MCV 90.1 93.8  PLT 228 209   Cardiac Enzymes: Recent Labs  Lab 10/16/17 2156 10/17/17 0534 10/17/17 1016  TROPONINI 0.08*  0.08* 0.08*   BNP: Invalid input(s): POCBNP CBG: No results for input(s): GLUCAP in the last 168 hours. D-Dimer No results for input(s): DDIMER in the last 72 hours. Hgb A1c No results for input(s): HGBA1C  in the last 72 hours. Lipid Profile No results for input(s): CHOL, HDL, LDLCALC, TRIG, CHOLHDL, LDLDIRECT in the last 72 hours. Thyroid function studies No results for input(s): TSH, T4TOTAL, T3FREE, THYROIDAB in the last 72 hours.  Invalid input(s): FREET3 Anemia work up No results for input(s): VITAMINB12, FOLATE, FERRITIN, TIBC, IRON, RETICCTPCT in the last 72 hours. Urinalysis    Component Value Date/Time   COLORURINE YELLOW 10/16/2017 1646   APPEARANCEUR HAZY (A) 10/16/2017 1646   LABSPEC 1.014 10/16/2017 1646   PHURINE 6.0 10/16/2017 1646   GLUCOSEU NEGATIVE 10/16/2017 1646   HGBUR NEGATIVE 10/16/2017 1646   BILIRUBINUR NEGATIVE 10/16/2017 1646   KETONESUR NEGATIVE 10/16/2017 1646   PROTEINUR NEGATIVE 10/16/2017 1646   NITRITE POSITIVE (A) 10/16/2017 1646   LEUKOCYTESUR LARGE (A) 10/16/2017 1646   Sepsis Labs Invalid input(s): PROCALCITONIN,  WBC,  LACTICIDVEN Microbiology Recent Results (from the past 240 hour(s))  Urine Culture     Status: Abnormal   Collection Time: 10/16/17  9:37 PM  Result Value Ref Range Status   Specimen Description   Final    URINE, CLEAN CATCH Performed at [facility], [place] [address], [place], [place] [address]    Special Requests   Final    NONE Performed at [facility], [place] [address], [place], [place] [address]    Culture >=100,000 COLONIES/mL ESCHERICHIA COLI (A)  Final   Report Status 10/19/2017 FINAL  Final   Organism ID, Bacteria ESCHERICHIA COLI (A)  Final      Susceptibility   Escherichia coli - MIC*    AMPICILLIN 4 SENSITIVE Sensitive     CEFAZOLIN <=4 SENSITIVE Sensitive     CEFTRIAXONE <=1 SENSITIVE Sensitive     CIPROFLOXACIN <=0.25 SENSITIVE Sensitive     GENTAMICIN <=1  SENSITIVE Sensitive     IMIPENEM <=0.25 SENSITIVE Sensitive     NITROFURANTOIN <=16 SENSITIVE Sensitive     TRIMETH/SULFA <=20 SENSITIVE Sensitive     AMPICILLIN/SULBACTAM 4 SENSITIVE Sensitive     PIP/TAZO <=4 SENSITIVE Sensitive     Extended ESBL NEGATIVE Sensitive     * >=100,000 COLONIES/mL ESCHERICHIA COLI  MRSA PCR Screening     Status: None   Collection Time: 10/17/17  4:09 PM  Result Value Ref Range Status   MRSA by PCR NEGATIVE NEGATIVE Final    Comment:        The GeneXpert MRSA Assay (FDA approved for NASAL specimens only), is one component of a comprehensive MRSA colonization surveillance program. It is not intended to diagnose MRSA infection nor to guide or monitor treatment for MRSA infections. Performed at [facility], [place] [address] [place],  [address]      Time coordinating discharge: 35 minutes  SIGNED:   Georgette Shell, MD  [facility] 10/22/2017, 2:34 PM Pager   If 7PM-7AM, please contact night-coverage [email or web address] Password [email or web address]

## 2017-10-22 NOTE — Progress Notes (Signed)
CSW following to assist with discharge planning to SNF for ST rehab. Per chart review, patient has very little engagement with PT and not progressing towards goals.  CSW spoke with patient's daughter Pauline Good) regarding discharge planning and patient's lack of participation with PT. Patient's daughter verbalized understanding and reported that she was agreeable to patient going to SNF for [facility] term care. Patient's daughter reported that patient has special assistance Medicaid and SSI that is covering the cost for ALF. CSW agreed to seek [facility] term care SNF placement for patient.  CSW currently searching for [facility] term care Medicaid bed at SNF for patient. CSW will continue to follow and assist with discharge planning.   [name], [place] Social Worker [facility] Cell#: [phone]

## 2017-10-22 NOTE — Progress Notes (Signed)
Patient received a bed offer at [facility] for Kaitlyn Simmons term care. CSW updated patient's daughter, patient's daughter accepted bed offer.   CSW followed up with [name] staff, staff reported that they are currently checking patient's Medicaid and that patient's daughter was not available to complete paperwork today.   Patient will continue to follow and assist with discharge planning.  [name], [place] Social Worker [facility] Cell#: [phone]

## 2017-10-23 ENCOUNTER — Encounter (HOSPITAL_COMMUNITY): Payer: Self-pay

## 2017-10-23 DIAGNOSIS — R32 Unspecified urinary incontinence: Secondary | ICD-10-CM | POA: Diagnosis not present

## 2017-10-23 DIAGNOSIS — I509 Heart failure, unspecified: Secondary | ICD-10-CM

## 2017-10-23 MED ORDER — MIDODRINE HCL 5 MG PO TABS
5.0000 mg | ORAL_TABLET | Freq: Two times a day (BID) | ORAL | Status: DC
  Administered 2017-10-23 – 2017-10-24 (×2): 5 mg via ORAL
  Filled 2017-10-23 (×3): qty 1

## 2017-10-23 NOTE — Progress Notes (Signed)
[facility] PROGRESS NOTE  Kaitlyn Simmons HYI:[id number] DOB: November 30, 1945 DOA: 10/16/2017 PCP: Arlis Porta., MD   Narrative: 72 year old female with severe dementia, EF 45% CKD 2 to 3 days status post PCI 2012, HLD, normochromic anemia prior GI bleed 06/2016 admitted from [name] with dyspnea eventual diagnosis was acute exacerbation of chronic systolic heart failure  A & Plan-  Acute exacerbation of systolic heart failure-ACE held because of low blood pressure-diuresed this admission l 1.7 -on hold because of relative hypotension Echocardiogram 8/22 shows a decrease in the EF to 25% Discussed with daughter and is aware of decreased EF and it is unlikely patient would benefit from any type of intervention given severe dementia  Hypotension-seems like this is constitutional and in keeping with a low EF-trend 5 mg 3 times daily  CAD-history of stenting-STEMI 2011, 2012 BMS to RCA 2011 and LAD 2012-continue only Plavix--no DAPt 2/2/ GIB, statin-cannot give ACE  Stage III CKD-BUN/creatinine within regular range-we will get labs in a.m.  E. coli cystitis >100,000-received 6 days of antibiotics total discontinued Keflex prior to discharge 8/28  Severe dementia-nonverbal and sometimes despondent-continue Prozac 40 daily, Namenda 5 twice daily, Risperdal 1 mg twice daily  Prior GI bleed-stable at this time   Full code, inpatient, await skilled facility   [place], MD  [facility] Direct contact: [phone] --[place] [place]  --[email or web address]; password [email or web address]  7PM-7AM contact night coverage as above 10/23/2017, 4:17 PM  LOS: 6 days   Consultants:  no  Procedures:  no  Antimicrobials:  no  Interval history/Subjective: Non verbal  Objective:  Vitals:  Vitals:   10/23/17 1300 10/23/17 1303  BP:  (!) 93/49  Pulse:  (!) 52  Resp:  16  Temp: 98.2 F (36.8 C)   SpO2:  96%    Exam:  [place] alert non verbal and non coherent eomi ncat s1 s 2no  m/r/g cta b No SL No le edema  I have personally reviewed the following:   Labs:  n  Imaging studies:  n  Medical tests:  n   Test discussed with performing physician:  n  Decision to obtain old records:  n  Review and summation of old records:  n  Scheduled Meds: . atorvastatin  80 mg Oral QHS  . clopidogrel  75 mg Oral Daily  . FLUoxetine  40 mg Oral Daily  . heparin  5,000 Units Subcutaneous Q8H  . memantine  5 mg Oral BID  . midodrine  5 mg Oral BID WC  . risperiDONE  1 mg Oral BID  . sodium chloride flush  3 mL Intravenous Q12H   Continuous Infusions: . sodium chloride Stopped (10/18/17 1651)    Active Problems:   Coronary artery disease due to lipid rich plaque   Essential hypertension   CKD (chronic kidney disease), stage III (HCC)   Abnormal ECG   Acute on chronic combined systolic and diastolic CHF (congestive heart failure) (HCC)   Acute respiratory distress   Acute lower UTI   CHF exacerbation (HCC)   CHF (congestive heart failure) ([place])   LOS: 6 days

## 2017-10-23 NOTE — Care Management Note (Signed)
Case Management Note  Patient Details  Name: Kaitlyn Simmons MRN: [id number] Date of Birth: 04-22-1945  Subjective/Objective: d/c order. D/c SNF-CSW following.                   Action/Plan:d/c SNF.   Expected Discharge Date:  10/22/17               Expected Discharge Plan:  Skilled Nursing Facility  In-House Referral:  Clinical Social Work  Discharge planning Services  CM Consult  Post Acute Care Choice:    Choice offered to:     DME Arranged:    DME Agency:     HH Arranged:    [place] Agency:     Status of Service:  Completed, signed off  If discussed at [employer], dates discussed:    Additional Comments:  Dessa Phi, RN 10/23/2017, 10:47 AM

## 2017-10-23 NOTE — Progress Notes (Signed)
CSW followed up with [facility] SNF to inquire about ability to accept patient today. Staff reported that patient's Medicaid is currently under review and that she will call CSW with an update. CSW will continue to follow and assist with discharge planning.  [name], [place] Social Worker [facility] Cell#: [phone]

## 2017-10-24 ENCOUNTER — Encounter (HOSPITAL_COMMUNITY): Payer: Self-pay

## 2017-10-24 DIAGNOSIS — R69 Illness, unspecified: Secondary | ICD-10-CM | POA: Diagnosis not present

## 2017-10-24 DIAGNOSIS — M6281 Muscle weakness (generalized): Secondary | ICD-10-CM | POA: Diagnosis not present

## 2017-10-24 DIAGNOSIS — R41841 Cognitive communication deficit: Secondary | ICD-10-CM | POA: Diagnosis not present

## 2017-10-24 DIAGNOSIS — R2689 Other abnormalities of gait and mobility: Secondary | ICD-10-CM | POA: Diagnosis not present

## 2017-10-24 DIAGNOSIS — R1311 Dysphagia, oral phase: Secondary | ICD-10-CM | POA: Diagnosis not present

## 2017-10-24 LAB — CBC WITH DIFFERENTIAL/PLATELET
BASOS ABS: 0 10*3/uL (ref 0.0–0.1)
BASOS PCT: 0 %
EOS ABS: 0.2 10*3/uL (ref 0.0–0.7)
Eosinophils Relative: 3 %
HEMATOCRIT: 42.6 % (ref 36.0–46.0)
HEMOGLOBIN: 13.5 g/dL (ref 12.0–15.0)
Lymphocytes Relative: 26 %
Lymphs Abs: 2 10*3/uL (ref 0.7–4.0)
MCH: 29.6 pg (ref 26.0–34.0)
MCHC: 31.7 g/dL (ref 30.0–36.0)
MCV: 93.4 fL (ref 78.0–100.0)
Monocytes Absolute: 0.8 10*3/uL (ref 0.1–1.0)
Monocytes Relative: 10 %
NEUTROS PCT: 61 %
Neutro Abs: 4.8 10*3/uL (ref 1.7–7.7)
Platelets: 196 10*3/uL (ref 150–400)
RBC: 4.56 MIL/uL (ref 3.87–5.11)
RDW: 15.6 % — ABNORMAL HIGH (ref 11.5–15.5)
WBC: 7.9 10*3/uL (ref 4.0–10.5)

## 2017-10-24 LAB — RENAL FUNCTION PANEL
ANION GAP: 8 (ref 5–15)
Albumin: 3.5 g/dL (ref 3.5–5.0)
BUN: 15 mg/dL (ref 8–23)
CALCIUM: 9.9 mg/dL (ref 8.9–10.3)
CO2: 23 mmol/L (ref 22–32)
CREATININE: 1.06 mg/dL — AB (ref 0.44–1.00)
Chloride: 109 mmol/L (ref 98–111)
GFR, EST AFRICAN AMERICAN: 59 mL/min — AB (ref >60)
GFR, EST NON AFRICAN AMERICAN: 51 mL/min — AB (ref >60)
Glucose, Bld: 91 mg/dL (ref 70–99)
PHOSPHORUS: 3.5 mg/dL (ref 2.5–4.6)
Potassium: 4 mmol/L (ref 3.5–5.1)
SODIUM: 140 mmol/L (ref 135–145)

## 2017-10-24 MED ORDER — FLUOXETINE HCL 10 MG PO CAPS: 40.0000 mg | ORAL_CAPSULE | Freq: Every day | ORAL | 0 refills | Status: DC

## 2017-10-24 MED ORDER — MIDODRINE HCL 5 MG PO TABS: 5.0000 mg | ORAL_TABLET | Freq: Two times a day (BID) | ORAL | Status: AC

## 2017-10-24 MED ORDER — RISPERIDONE 1 MG/ML PO SOLN: 1.0000 mg | Freq: Two times a day (BID) | ORAL | 0 refills | Status: AC

## 2017-10-24 MED ORDER — FUROSEMIDE 20 MG PO TABS: 20.0000 mg | ORAL_TABLET | ORAL | 11 refills | Status: AC

## 2017-10-24 NOTE — Clinical Social Work Placement (Signed)
Patient received and accepted bed offer at [facility]. Facility aware of patient's discharge and confirmed bed offer. PTAR contacted, patient's family notified. Patient's RN can call report to [phone] Room 222B, packet complete. CSW signing off, no other needs identified at this time.  CLINICAL SOCIAL WORK PLACEMENT  NOTE  Date:  10/24/2017  Patient Details  Name: Kaitlyn MOSSA MRN: [id number] Date of Birth: 12-25-1945  Clinical Social Work is seeking post-discharge placement for this patient at the [place] level of care (*CSW will initial, date and re-position this form in  chart as items are completed):  Yes   Patient/family provided with [facility] Work Department's list of facilities offering this level of care within the geographic area requested by the patient (or if unable, by the patient's family).  Yes   Patient/family informed of their freedom to choose among providers that offer the needed level of care, that participate in Medicare, Medicaid or managed care program needed by the patient, have an available bed and are willing to accept the patient.  Yes   Patient/family informed of [facility]'s ownership interest in [facility] and [facility], as well as of the fact that they are under no obligation to receive care at these facilities.  PASRR submitted to EDS on       PASRR number received on       Existing PASRR number confirmed on 10/18/17     FL2 transmitted to all facilities in geographic area requested by pt/family on 10/18/17     FL2 transmitted to all facilities within larger geographic area on       Patient informed that his/her managed care company has contracts with or will negotiate with certain facilities, including the following:        Yes   Patient/family informed of bed offers received.  Patient chooses bed at [facility]     Physician recommends and patient chooses bed at        Patient to be transferred to [facility] on 10/24/17.  Patient to be transferred to facility by PTAR     Patient family notified on 10/24/17 of transfer.  Name of family member notified:  Pauline Good     PHYSICIAN       Additional Comment:    _______________________________________________ Burnis Medin, LCSW 10/24/2017, 1:52 PM

## 2017-10-24 NOTE — Progress Notes (Signed)
CSW following to assist with discharge planning to SNF.  CSW followed up with [facility] SN[facility] staff member Tammy, staff reported that [facility] is requesting updated physical therapy notes. Staff reported that they will need a [employer] authorization denial before they can accept patient under Medicaid benefit.   CSW updated patient's RN that patient needs updated physical therapy notes for insurance authorization purposes, RN agreed to page patient's physical therapist once patient wakes up.  CSW updated patient's attending MD.  CSW will continue to follow and assist with discharge planning.  Patient needs a decision from [facility] regarding insurance authorization for SNF. If patient is denied for insurance authorization for SNF, patient can admit to SNF under Medicaid benefit for [facility] term care.  [name], [place] Social Worker [facility] Cell#: [phone]

## 2017-10-24 NOTE — Progress Notes (Signed)
Physical Therapy Treatment Patient Details Name: Kaitlyn Simmons MRN: [id number] DOB: August 23, 1945 Today's Date: 10/24/2017    History of Present Illness 72 YO female admitted to ED from SNF on 8/22 for shortness of breath, potential UTI. PMH significant for advanced dementia, CAD, CHF, HTN, CKD III.     PT Comments    Pt more engaged in PT today, demonstrated by eye contact. Pt with total assist to perform mobility tasks, but was able to stand with total support from PT and RN. PT recommendation remains SNF to return pt to PLOF of ambulating with RW. Plan is to potentially d/c pt today.    Follow Up Recommendations  SNF;Supervision/Assistance - 24 hour     Equipment Recommendations  None recommended by PT    Recommendations for Other Services       Precautions / Restrictions Precautions Precautions: Fall Restrictions Weight Bearing Restrictions: No    Mobility  Bed Mobility Overal bed mobility: Needs Assistance Bed Mobility: Supine to Sit;Sit to Supine     Supine to sit: HOB elevated;Total assist Sit to supine: HOB elevated;Total assist;+2 for physical assistance   General bed mobility comments: Total assist for trunk elevation/lowering, scooting. Pt sat EOB with bilat LEs on the ground and hands in lap. Able to support self for 30 seconds at a time before fatiguing or becoming disengageed with PT. Pt  Pt with periods engagement via eye contact, but fleeting. Sit to supine total +2 for trunk and bilat LE management. Increased time to perform all bed mobility tasks.  Transfers Overall transfer level: Needs assistance(unable to transfer today due to lack of pt engagement and agitation upon sitting on EOB for 2 minutes) Equipment used: Rolling walker (2 wheeled) Transfers: Sit to/from Stand Sit to Stand: +2 physical assistance;Total assist         General transfer comment: Total assist +2 for sit to stand. Able to stand for 30 seconds before quickly sitting back down. Pt  required total trunk support to remain upright during standing.   Ambulation/Gait                 Stairs             Wheelchair Mobility    Modified Rankin (Stroke Patients Only)       Balance Overall balance assessment: Needs assistance Sitting-balance support: Feet supported Sitting balance-Leahy Scale: Poor Sitting balance - Comments: sat for approximately 0.5 minute with hands in lap, feet supported, and no PT support for balance.  Postural control: Posterior lean Standing balance support: Bilateral upper extremity supported Standing balance-Leahy Scale: Zero Standing balance comment: total assist +2 for standing                             Cognition Arousal/Alertness: Awake/alert Behavior During Therapy: Anxious(1 period of agitation when sitting EOB, grabbed PT's arms and looked frustrated) Overall Cognitive Status: History of cognitive impairments - at baseline                                 General Comments: Pt with advanced dementia, able to state yes/no when instructed with short commands for mobility       Exercises      General Comments        Pertinent Vitals/Pain Pain Assessment: Faces Faces Pain Scale: No hurt    Home Living  Prior Function            PT Goals (current goals can now be found in the care plan section) Acute Rehab PT Goals PT Goal Formulation: With patient Time For Goal Achievement: 10/25/17 Potential to Achieve Goals: Fair Progress towards PT goals: Progressing toward goals(more engagement today, able to stand with support)    Frequency    Min 3X/week      PT Plan Current plan remains appropriate    Co-evaluation              AM-PAC PT "6 Clicks" Daily Activity  Outcome Measure  Difficulty turning over in bed (including adjusting bedclothes, sheets and blankets)?: Unable Difficulty moving from lying on back to sitting on the side of the  bed? : Unable Difficulty sitting down on and standing up from a chair with arms (e.g., wheelchair, bedside commode, etc,.)?: Unable Help needed moving to and from a bed to chair (including a wheelchair)?: Total Help needed walking in hospital room?: Total Help needed climbing 3-5 steps with a railing? : Total 6 Click Score: 6    End of Session Equipment Utilized During Treatment: Gait belt Activity Tolerance: Patient limited by lethargy Patient left: in bed;with bed alarm set;with nursing/sitter in room;with call bell/phone within reach Nurse Communication: Mobility status PT Visit Diagnosis: Muscle weakness (generalized) (M62.81);Other abnormalities of gait and mobility (R26.89)     Time: 2336-1224 PT Time Calculation (min) (ACUTE ONLY): 23 min  Charges:  $Therapeutic Activity: 23-37 mins                     Aldean Pipe Conception Chancy, PT, DPT  Pager # [phone]     Novella Abraha D Nevaen Tredway 10/24/2017, 12:45 PM

## 2017-10-24 NOTE — Discharge Summary (Signed)
Physician Discharge Summary  Kaitlyn Simmons YQM:[id number] DOB: 04/26/1945 DOA: 10/16/2017  PCP: Arlis Porta., MD  Admit date: 10/16/2017 Discharge date: 10/24/2017  Admitted From: NH Disposition:NH  Recommendations for Outpatient Follow-up:  1. Follow up with PCP in 1-2 weeks 2. Please obtain BMP/CBC in one week 3. Recommend q. other daily dosing of Lasix-have discontinued lisinopril this admission and added Midodrin for blood pressure 4. Will invariably decline mentally given dementia-please consider OP goals of care  Home Health NONE Equipment/Devices NONE  Discharge Condition STABLE CODE STATUS FULL Diet recommendation: CARDIAC Brief/Interim Summary:72 year old female who presented with dyspnea. She does have significant past medical history for coronary artery disease with ischemic cardiomyopathy, ejection fraction 45%, coronary artery disease, chronic kidney disease stage III and dementia. Patient reported dyspnea at the skilled nursing facility, that prompted her to be transfer to the hospital. On her initial physical examination her blood pressure was 103/80, heart rate 62, respiratory rate 20, oxygen saturation 99%.Her lungs no significant rales or wheezing, heart S1-S2 present and rhythmic, abdomen soft nontender, no significant lower extremity edema.  Sodium 141, potassium 3.8, chloride 105, bicarb 24 glucose 86, BUN 26, creatinine 1.24, BNP 244, white count 9.1, hemoglobin 12.5, hematocrit 38.2, platelets 228, urinalysis 21-50 white cells, specific gravity 1.014.  Her chest x-ray had increased interstitial markings bilaterally.  EKG sinus rhythm, left axis deviation, positive PACs and PVCs, poor R wave progression.  Patient was admitted to the hospital with the working diagnosis of acute decompensation of chronic diastolic heart failure,complicated by a urinary tract infection.  Discharge Diagnoses:  Active Problems:   Coronary artery disease due to lipid rich  plaque   Essential hypertension   CKD (chronic kidney disease), stage III (HCC)   Abnormal ECG   Acute on chronic combined systolic and diastolic CHF (congestive heart failure) (HCC)   Acute respiratory distress   Acute lower UTI   CHF exacerbation (HCC)   CHF (congestive heart failure) ([place])  1.  Acute on chronic systolic heart failure exacerbation/ischemic cardiomyopathy.  Patient was admitted to the medical ward, she was placed on a remote telemetry monitor, she received diuresis with IV furosemide, and negative fluid balance was achieved (-1,6 ml) with improvement of her symptoms.  Further work-up with echocardiography showed worsening on LV systolic function down to 25 to 35% with akinesis of the apical and lateral septal/apical myocardium.--I discussed this with her daughter and we both agreed that overall patient prognosis is poor she would not be a great Candidate-for cardiac cath-her ace inhibitor unfortunately had to be discontinued she should be on Q other daily dosing of Lasix  2.  Stage III chronic kidney disease.  Kidney function remained stable with serum creatinine in the 1 range throughout hospitalization  3.  E. coli urinary tract infection, present on admission.  Patient was placed on antibiotic therapy IV ceftriaxone with improvement of her symptoms.finished keflex course this hospital admission  4.  Dementia stage VII.  No significant agitation, patient will continue fluoxetine, memantine, risperidone, and melatonin.    Discharge Instructions  Discharge Instructions    Call MD for:  difficulty breathing, headache or visual disturbances   Complete by:  As directed    Call MD for:  persistant nausea and vomiting   Complete by:  As directed    Diet - low sodium heart healthy   Complete by:  As directed    Diet - low sodium heart healthy   Complete by:  As directed  Diet - low sodium heart healthy   Complete by:  As directed    Discharge instructions   Complete  by:  As directed    Please follow with primary care in 7 days.   Increase activity slowly   Complete by:  As directed    Increase activity slowly   Complete by:  As directed    Increase activity slowly   Complete by:  As directed      Allergies as of 10/24/2017   No Known Allergies     Medication List    STOP taking these medications   lisinopril 2.5 MG tablet Commonly known as:  PRINIVIL,ZESTRIL     TAKE these medications   acetaminophen 500 MG tablet Commonly known as:  TYLENOL Take 500 mg by mouth 2 (two) times daily as needed (knee pain).   atorvastatin 80 MG tablet Commonly known as:  LIPITOR Take 1 tablet (80 mg total) by mouth at bedtime.   clopidogrel 75 MG tablet Commonly known as:  PLAVIX Take 1 tablet (75 mg total) by mouth daily.   dimethicone-zinc oxide cream Apply 1 application topically daily. And as needed   FLUoxetine 10 MG capsule Commonly known as:  PROZAC Take 4 capsules (40 mg total) by mouth daily.   furosemide 20 MG tablet Commonly known as:  LASIX Take 1 tablet (20 mg total) by mouth every other day. Start 8/30 What changed:    medication strength  how much to take  when to take this  additional instructions   Melatonin 5 MG Tabs Take 5 mg by mouth at bedtime.   memantine 5 MG tablet Commonly known as:  NAMENDA Take 5 mg by mouth 2 (two) times daily.   midodrine 5 MG tablet Commonly known as:  PROAMATINE Take 1 tablet (5 mg total) by mouth 2 (two) times daily with a meal.   MINTOX 469-629-52 MG/5ML suspension Generic drug:  alum & mag hydroxide-simeth Take 30 mLs by mouth every 6 (six) hours as needed for indigestion or heartburn.   nitroGLYCERIN 0.4 MG SL tablet Commonly known as:  NITROSTAT Place 1 tablet (0.4 mg total) under the tongue every 5 (five) minutes as needed for chest pain.   nystatin powder Generic drug:  nystatin Apply 1 application topically 2 (two) times daily.   OCUVITE ADULT 50+ Caps Take 1  capsule by mouth every morning.   potassium chloride 10 MEQ tablet Commonly known as:  K-DUR Start taking it 8/30 with lasix   risperiDONE 1 MG/ML oral solution Commonly known as:  RISPERDAL Take 1 mL (1 mg total) by mouth 2 (two) times daily.      Follow-up Information    Nahser, Wonda Cheng, MD Follow up.   Specialty:  Cardiology Why:  The office will call. Contact information: [address] [place] [address] [phone]        Arlis Porta., MD Follow up in 1 week(s).   Specialty:  Family Medicine         No Known Allergies  Consultations:     Procedures/Studies: Dg Chest Port 1 View  Result Date: 10/16/2017 CLINICAL DATA:  Shortness of breath EXAM: PORTABLE CHEST 1 VIEW COMPARISON:  August 16, 2017 FINDINGS: No edema or consolidation. There is cardiomegaly with pulmonary vascularity normal. There is aortic atherosclerosis. No adenopathy. No bone lesions. IMPRESSION: Cardiomegaly. No edema or consolidation. There is aortic atherosclerosis. Aortic Atherosclerosis (ICD10-I70.0). Electronically Signed   By: Lowella Grip III M.D.  On: 10/16/2017 17:10   (Echo, Carotid, EGD, Colonoscopy, ERCP)    Subjective:   Discharge Exam: Vitals:   10/23/17 2043 10/24/17 0618  BP: 107/64 102/68  Pulse: (!) 59 62  Resp: 16 17  Temp: (!) 97.5 F (36.4 C) 97.9 F (36.6 C)  SpO2: 98% 99%   Vitals:   10/23/17 1645 10/23/17 2043 10/24/17 0500 10/24/17 0618  BP: (!) 95/53 107/64  102/68  Pulse: 60 (!) 59  62  Resp:  16  17  Temp: 98.1 F (36.7 C) (!) 97.5 F (36.4 C)  97.9 F (36.6 C)  TempSrc: Oral Oral  Oral  SpO2: 97% 98%  99%  Weight:   90.2 kg   Height:        General: Pt is alert, awake, not in acute distress Cardiovascular: RRR, S1/S2 +, no rubs, no gallops Respiratory: CTA bilaterally, no wheezing, no rhonchi Abdominal: Soft, NT, ND, bowel sounds + Extremities: no edema, no cyanosis    The results of significant  diagnostics from this hospitalization (including imaging, microbiology, ancillary and laboratory) are listed below for reference.     Microbiology: Recent Results (from the past 240 hour(s))  Urine Culture     Status: Abnormal   Collection Time: 10/16/17  9:37 PM  Result Value Ref Range Status   Specimen Description   Final    URINE, CLEAN CATCH Performed at [facility], [place] [address], [place], [place] [address]    Special Requests   Final    NONE Performed at [facility], [place] [address], [place], [place] [address]    Culture >=100,000 COLONIES/mL ESCHERICHIA COLI (A)  Final   Report Status 10/19/2017 FINAL  Final   Organism ID, Bacteria ESCHERICHIA COLI (A)  Final      Susceptibility   Escherichia coli - MIC*    AMPICILLIN 4 SENSITIVE Sensitive     CEFAZOLIN <=4 SENSITIVE Sensitive     CEFTRIAXONE <=1 SENSITIVE Sensitive     CIPROFLOXACIN <=0.25 SENSITIVE Sensitive     GENTAMICIN <=1 SENSITIVE Sensitive     IMIPENEM <=0.25 SENSITIVE Sensitive     NITROFURANTOIN <=16 SENSITIVE Sensitive     TRIMETH/SULFA <=20 SENSITIVE Sensitive     AMPICILLIN/SULBACTAM 4 SENSITIVE Sensitive     PIP/TAZO <=4 SENSITIVE Sensitive     Extended ESBL NEGATIVE Sensitive     * >=100,000 COLONIES/mL ESCHERICHIA COLI  MRSA PCR Screening     Status: None   Collection Time: 10/17/17  4:09 PM  Result Value Ref Range Status   MRSA by PCR NEGATIVE NEGATIVE Final    Comment:        The GeneXpert MRSA Assay (FDA approved for NASAL specimens only), is one component of a comprehensive MRSA colonization surveillance program. It is not intended to diagnose MRSA infection nor to guide or monitor treatment for MRSA infections. Performed at [facility], [place] [address], [place], [place] [address]      Labs: BNP (last 3 results) Recent Labs    10/16/17 1720  BNP 914.7*   Basic Metabolic Panel: Recent Labs  Lab 10/18/17 0516  10/19/17 0542 10/24/17 0517  NA 141 141 140  K 3.6 3.6 4.0  CL 107 106 109  CO2 24 25 23   GLUCOSE 87 97 91  BUN 19 17 15   CREATININE 1.08* 1.01* 1.06*  CALCIUM 9.4 9.9 9.9  MG 2.1  --   --   PHOS  --   --  3.5  Liver Function Tests: Recent Labs  Lab 10/24/17 0517  ALBUMIN 3.5   No results for input(s): LIPASE, AMYLASE in the last 168 hours. No results for input(s): AMMONIA in the last 168 hours. CBC: Recent Labs  Lab 10/18/17 1323 10/24/17 0517  WBC 6.3 7.9  NEUTROABS  --  4.8  HGB 13.3 13.5  HCT 42.4 42.6  MCV 93.8 93.4  PLT 209 196   Cardiac Enzymes: No results for input(s): CKTOTAL, CKMB, CKMBINDEX, TROPONINI in the last 168 hours. BNP: Invalid input(s): POCBNP CBG: No results for input(s): GLUCAP in the last 168 hours. D-Dimer No results for input(s): DDIMER in the last 72 hours. Hgb A1c No results for input(s): HGBA1C in the last 72 hours. Lipid Profile No results for input(s): CHOL, HDL, LDLCALC, TRIG, CHOLHDL, LDLDIRECT in the last 72 hours. Thyroid function studies No results for input(s): TSH, T4TOTAL, T3FREE, THYROIDAB in the last 72 hours.  Invalid input(s): FREET3 Anemia work up No results for input(s): VITAMINB12, FOLATE, FERRITIN, TIBC, IRON, RETICCTPCT in the last 72 hours. Urinalysis    Component Value Date/Time   COLORURINE YELLOW 10/16/2017 1646   APPEARANCEUR HAZY (A) 10/16/2017 1646   LABSPEC 1.014 10/16/2017 1646   PHURINE 6.0 10/16/2017 1646   GLUCOSEU NEGATIVE 10/16/2017 1646   HGBUR NEGATIVE 10/16/2017 1646   BILIRUBINUR NEGATIVE 10/16/2017 1646   KETONESUR NEGATIVE 10/16/2017 1646   PROTEINUR NEGATIVE 10/16/2017 1646   NITRITE POSITIVE (A) 10/16/2017 1646   LEUKOCYTESUR LARGE (A) 10/16/2017 1646   Sepsis Labs Invalid input(s): PROCALCITONIN,  WBC,  LACTICIDVEN Microbiology Recent Results (from the past 240 hour(s))  Urine Culture     Status: Abnormal   Collection Time: 10/16/17  9:37 PM  Result Value Ref Range Status    Specimen Description   Final    URINE, CLEAN CATCH Performed at [facility], [place] [address] [place], [place] [address]    Special Requests   Final    NONE Performed at [facility], [place] [address] [place], [place] [address]    Culture >=100,000 COLONIES/mL ESCHERICHIA COLI (A)  Final   Report Status 10/19/2017 FINAL  Final   Organism ID, Bacteria ESCHERICHIA COLI (A)  Final      Susceptibility   Escherichia coli - MIC*    AMPICILLIN 4 SENSITIVE Sensitive     CEFAZOLIN <=4 SENSITIVE Sensitive     CEFTRIAXONE <=1 SENSITIVE Sensitive     CIPROFLOXACIN <=0.25 SENSITIVE Sensitive     GENTAMICIN <=1 SENSITIVE Sensitive     IMIPENEM <=0.25 SENSITIVE Sensitive     NITROFURANTOIN <=16 SENSITIVE Sensitive     TRIMETH/SULFA <=20 SENSITIVE Sensitive     AMPICILLIN/SULBACTAM 4 SENSITIVE Sensitive     PIP/TAZO <=4 SENSITIVE Sensitive     Extended ESBL NEGATIVE Sensitive     * >=100,000 COLONIES/mL ESCHERICHIA COLI  MRSA PCR Screening     Status: None   Collection Time: 10/17/17  4:09 PM  Result Value Ref Range Status   MRSA by PCR NEGATIVE NEGATIVE Final    Comment:        The GeneXpert MRSA Assay (FDA approved for NASAL specimens only), is one component of a comprehensive MRSA colonization surveillance program. It is not intended to diagnose MRSA infection nor to guide or monitor treatment for MRSA infections. Performed at [facility], [place] [address], [place], [place] [address]      Time coordinating discharge: 35 minutes  SIGNED:   Nita Sells, MD  [facility]  10/24/2017, 11:25 AM Pager   If 7PM-7AM, please contact night-coverage [email or web address] Password [email or web address]

## 2017-10-24 NOTE — Care Management Important Message (Signed)
Important Message  Patient Details  Name: Kaitlyn Simmons MRN: [id number] Date of Birth: 1945/12/12   Medicare Important Message Given:  Yes    Kerin Salen 10/24/2017, 10:54 AMImportant Message  Patient Details  Name: Kaitlyn Simmons MRN: [id number] Date of Birth: 1945-11-23   Medicare Important Message Given:  Yes    Kerin Salen 10/24/2017, 10:54 AM

## 2017-10-24 NOTE — Progress Notes (Signed)
CSW contacted by [facility] staff member Tammy and informed that they can accept patient today under Medicaid benefit. CSW updated patient's attending MD. CSW will continue to follow and assist with discharge planning.   [name], [place] Social Worker [facility] Cell#: [phone]

## 2017-10-25 ENCOUNTER — Encounter: Payer: Self-pay | Admitting: Adult Health

## 2017-10-25 ENCOUNTER — Non-Acute Institutional Stay (SKILLED_NURSING_FACILITY): Payer: Medicare HMO | Admitting: Adult Health

## 2017-10-25 DIAGNOSIS — I2583 Coronary atherosclerosis due to lipid rich plaque: Secondary | ICD-10-CM | POA: Diagnosis not present

## 2017-10-25 DIAGNOSIS — R69 Illness, unspecified: Secondary | ICD-10-CM | POA: Diagnosis not present

## 2017-10-25 DIAGNOSIS — F418 Other specified anxiety disorders: Secondary | ICD-10-CM

## 2017-10-25 DIAGNOSIS — I951 Orthostatic hypotension: Secondary | ICD-10-CM

## 2017-10-25 DIAGNOSIS — F0281 Dementia in other diseases classified elsewhere with behavioral disturbance: Secondary | ICD-10-CM

## 2017-10-25 DIAGNOSIS — M6281 Muscle weakness (generalized): Secondary | ICD-10-CM | POA: Diagnosis not present

## 2017-10-25 DIAGNOSIS — N183 Chronic kidney disease, stage 3 unspecified: Secondary | ICD-10-CM

## 2017-10-25 DIAGNOSIS — F0390 Unspecified dementia without behavioral disturbance: Secondary | ICD-10-CM

## 2017-10-25 DIAGNOSIS — E785 Hyperlipidemia, unspecified: Secondary | ICD-10-CM

## 2017-10-25 DIAGNOSIS — I2510 Atherosclerotic heart disease of native coronary artery without angina pectoris: Secondary | ICD-10-CM | POA: Diagnosis not present

## 2017-10-25 DIAGNOSIS — G301 Alzheimer's disease with late onset: Secondary | ICD-10-CM

## 2017-10-25 NOTE — Progress Notes (Signed)
Location:   [facility] Room Number: [place] of Service:  SNF (31)   CODE STATUS: Full Code  No Known Allergies  Chief Complaint  Patient presents with  . Hospitalization Follow-up    Hospital Follow up    HPI:  She is a 72 year old woman who has been hospitalized from 10-16-17 through 10-24-17. She had been hospitalized for acute on chronic systolic heart failure; stage 3 ckd; dementia and uti. She is unable to participate in the hpi or ros. There are no reports of agitation; no indications of pain; no change appetite. She will continue to be followed for her chronic illnesses cad; ckd stage 3 dementia. There are no nursing concerns at this time.    Past Medical History:  Diagnosis Date  . CAD (coronary artery disease)    a. s/p IL STEMI 7/11 => tx with BMS to RCA (tx at [facility]);  b.  AL STEMI 7/12 => LHC: mRCA 100% ==> PCI with BMS to RCA; LM 10%, pCFX 50%, OM1 10%, pLAD 50%, mLAD 99%, 70%  ==>  c. staged PCI: BMS to  LAD (tx at [facility])  . Cataracts, bilateral   . Chronic systolic heart failure ([place])   . Dementia   . GERD (gastroesophageal reflux disease)   . HTN (hypertension)   . Hyperlipidemia   . Ischemic cardiomyopathy    a. EF 30-35% at time of AL STEMI 7/12;   b. IMPROVED by f/u Echo 9/13:  mild LVH, EF 45-50%, mid to dist Inf, AS, Apical HK, Gr 1 diast dysfn, MAC, mod LAE  . STEMI (ST elevation myocardial infarction) ([place]) 2011; 2012   [place]; [place]    Past Surgical History:  Procedure Laterality Date  . CATARACT EXTRACTION W/ INTRAOCULAR LENS IMPLANT Left ~ 2008  . CHOLECYSTECTOMY N/A 10/07/2012   Procedure: LAPAROSCOPIC CHOLECYSTECTOMY WITH INTRAOPERATIVE CHOLANGIOGRAM;  Surgeon: Shann Medal, MD;  Location: [facility];  Service: General;  Laterality: N/A;  . CORONARY ANGIOPLASTY WITH STENT PLACEMENT  2011; 2012   RCA; RCA & LAD  . DILATION AND CURETTAGE OF UTERUS  1970's  . ERCP N/A 10/08/2012   Procedure: ENDOSCOPIC RETROGRADE CHOLANGIOPANCREATOGRAPHY  (ERCP);  Surgeon: Gatha Mayer, MD;  Location: [facility];  Service: Endoscopy;  Laterality: N/A;  . ERCP W/ SPHICTEROTOMY  11/10/2012   stones removed [place]  . TONSILLECTOMY AND ADENOIDECTOMY  1980's    Social History   Socioeconomic History  . Marital status: Single    Spouse name: Not on file  . Number of children: Not on file  . Years of education: Not on file  . Highest education level: Not on file  Occupational History  . Not on file  Social Needs  . Financial resource strain: Not on file  . Food insecurity:    Worry: Not on file    Inability: Not on file  . Transportation needs:    Medical: Not on file    Non-medical: Not on file  Tobacco Use  . Smoking status: Former Smoker    Packs/day: 2.00    Years: 30.00    Pack years: 60.00    Types: Cigarettes    Last attempt to quit: 10/25/2002    Years since quitting: 15.0  . Smokeless tobacco: Never Used  Substance and Sexual Activity  . Alcohol use: No  . Drug use: No  . Sexual activity: Never  Lifestyle  . Physical activity:    Days per week: Not on file  Minutes per session: Not on file  . Stress: Not on file  Relationships  . Social connections:    Talks on phone: Not on file    Gets together: Not on file    Attends religious service: Not on file    Active member of club or organization: Not on file    Attends meetings of clubs or organizations: Not on file    Relationship status: Not on file  . Intimate partner violence:    Fear of current or ex partner: Not on file    Emotionally abused: Not on file    Physically abused: Not on file    Forced sexual activity: Not on file  Other Topics Concern  . Not on file  Social History Narrative  . Not on file   Family History  Problem Relation Age of Onset  . Heart attack Father        DECEASED  . Stroke Father        DECEASED  . Leukemia Mother        DECEASED  . Heart attack Sister        ALIVE  . Hypertension Sister       VITAL SIGNS BP  127/78   Pulse (!) 59   Temp 98.2 F (36.8 C)   Resp 18   Ht _0  (1.575 m)   Wt 198 lb 14 oz (90.2 kg)   BMI 36.37 kg/m   Outpatient Encounter Medications as of 10/25/2017  Medication Sig  . acetaminophen (TYLENOL) 500 MG tablet Take 500 mg by mouth 2 (two) times daily as needed (knee pain).   Marland Kitchen alum & mag hydroxide-simeth (MINTOX) 578-469-62 MG/5ML suspension Take 30 mLs by mouth every 6 (six) hours as needed for indigestion or heartburn.  . clopidogrel (PLAVIX) 75 MG tablet Take 1 tablet (75 mg total) by mouth daily.  Marland Kitchen FLUoxetine (PROZAC) 10 MG capsule Take 4 capsules (40 mg total) by mouth daily.  . furosemide (LASIX) 20 MG tablet Take 1 tablet (20 mg total) by mouth every other day. Start 8/30  . Melatonin 5 MG TABS Take 5 mg by mouth at bedtime.  . memantine (NAMENDA) 5 MG tablet Take 5 mg by mouth 2 (two) times daily.  . midodrine (PROAMATINE) 5 MG tablet Take 1 tablet (5 mg total) by mouth 2 (two) times daily with a meal.  . Multiple Vitamins-Minerals (OCUVITE ADULT 50+) CAPS Take 1 capsule by mouth every morning.  . nitroGLYCERIN (NITROSTAT) 0.4 MG SL tablet Place 1 tablet (0.4 mg total) under the tongue every 5 (five) minutes as needed for chest pain.  Marland Kitchen NYSTATIN powder Apply 1 application topically 2 (two) times daily.  . potassium chloride (K-DUR) 10 MEQ tablet Start taking it 8/30 with lasix  . risperiDONE (RISPERDAL) 1 MG/ML oral solution Take 1 mL (1 mg total) by mouth 2 (two) times daily.  . rosuvastatin (CRESTOR) 20 MG tablet Take 20 mg by mouth daily.  . Skin Protectants, Misc. (DIMETHICONE-ZINC OXIDE) cream Apply 1 application topically daily. And as needed  . [DISCONTINUED] atorvastatin (LIPITOR) 80 MG tablet Take 1 tablet (80 mg total) by mouth at bedtime. (Patient not taking: Reported on 10/25/2017)   No facility-administered encounter medications on file as of 10/25/2017.      SIGNIFICANT DIAGNOSTIC EXAMS  TODAY:   10-16-17: chest x-ray: Cardiomegaly. No  edema or consolidation. There is aortic Atherosclerosis. Aortic Atherosclerosis   10-17-17: 2-d echo:  - Left ventricle: The cavity size was normal.  Systolic function was severely reduced. The estimated ejection fraction was in the range of 25% to 30%. Akinesis of the apicalanteroseptal and apical myocardium. Severe hypokinesis of the basalinferior myocardium. Doppler parameters are consistent with abnormal left ventricular relaxation (grade 1 diastolic dysfunction). - Aortic valve: Mildly calcified annulus. Trileaflet; normal thickness leaflets.  - Aortic root: The aortic root was mildly dilated. - Mitral valve: Mildly calcified annulus. There was mild regurgitation. - Left atrium: The atrium was moderately to severely dilated.  LABS REVIEWED:   10-16-17: wbc 9.1; hgb 12.5; hct 38.2; mcv 90.1; plt 228; glucose 86; bun 26; creat 1.24; k+ 3.8; na++ 141; ca 10.0; liver normal albumin 4.0 BNP 244.4 urine culture: e-coli. D-dimer: 0.54 10-17-17: tsh 3.106 10-18-17: wbc 6.3; hgb 13.3; hct 42.4; mcv 93.8; plt 209; glucose 87; bun 19; creat 1.08; k+ 3.6; na++ 141; ca 9.4 mag 2.1  10-24-17: wbc 7.9; hgb 13.5; hct 42.6; mcv 93.4; plt 196; glucose 91; bun 15; creat 1.06 ;k+ 4.0; na++ 140; ca 9.9   Review of Systems  Unable to perform ROS: Dementia (nonverbal )    Physical Exam  Constitutional: She appears well-developed and well-nourished. No distress.  Neck: Carotid bruit is present. No thyromegaly present.  Bilateral   Cardiovascular: Normal rate, regular rhythm and intact distal pulses.  Murmur heard. 1/6  Pulmonary/Chest: Effort normal and breath sounds normal. No respiratory distress.  Abdominal: Soft. Bowel sounds are normal. She exhibits no distension. There is no tenderness.  Musculoskeletal: She exhibits no edema.  Would not voluntarily extremities   Lymphadenopathy:    She has no cervical adenopathy.  Neurological:  Is aware   Skin: Skin is warm and dry. She is not diaphoretic.       ASSESSMENT/ PLAN:  TODAY:   1. Acute on chronic combined systolic and diastolic CHF: without change EF 25-30 % (10-17-17): will continue lasix 20 mg every other day.   2. Coronary artery disease due to lipid rich plaque: status post PTCA: is stable will continue plavix 75 mg daily and has prn ntg.   3. Orthostatic hypotension: is stable b/p 127/78: will continue midodrine 5 mg twice daily   4. Alzheimer's dementia late onset with behavioral disturbance: is without change: weight is 198 pounds. Will continue namenda 5 mg twice daily   5.  Depression with anxiety: is stable will continue prozac 40 mg daily is taking melatonin 5 mg nightly   6.  Psychosis in elderly without behavioral disturbance: is without change: will continue risperdal 1 mg twice daily   7. Dyslipidemia: is stable will continue crestor 20 mg daily   8. CKD (chronic kidney disease) stage III: is stable bun 15 creat 1.06  Will check cbc; bmp She will need to follow up with cardiology    MD is aware of resident's narcotic use and is in agreement with current plan of care. We will attempt to wean resident as apropriate   Ok Edwards NP [facility]  Contact [phone] Monday through Friday 8am- 5pm  After hours call [phone]

## 2017-10-28 DIAGNOSIS — M6281 Muscle weakness (generalized): Secondary | ICD-10-CM | POA: Diagnosis not present

## 2017-10-28 DIAGNOSIS — R69 Illness, unspecified: Secondary | ICD-10-CM | POA: Diagnosis not present

## 2017-10-28 DIAGNOSIS — I2583 Coronary atherosclerosis due to lipid rich plaque: Secondary | ICD-10-CM | POA: Diagnosis not present

## 2017-10-28 DIAGNOSIS — R1311 Dysphagia, oral phase: Secondary | ICD-10-CM | POA: Diagnosis not present

## 2017-10-28 DIAGNOSIS — R41841 Cognitive communication deficit: Secondary | ICD-10-CM | POA: Diagnosis not present

## 2017-10-29 DIAGNOSIS — R1311 Dysphagia, oral phase: Secondary | ICD-10-CM | POA: Diagnosis not present

## 2017-10-29 DIAGNOSIS — R41841 Cognitive communication deficit: Secondary | ICD-10-CM | POA: Diagnosis not present

## 2017-10-29 DIAGNOSIS — I2583 Coronary atherosclerosis due to lipid rich plaque: Secondary | ICD-10-CM | POA: Diagnosis not present

## 2017-10-29 DIAGNOSIS — Z79899 Other long term (current) drug therapy: Secondary | ICD-10-CM | POA: Diagnosis not present

## 2017-10-29 DIAGNOSIS — M6281 Muscle weakness (generalized): Secondary | ICD-10-CM | POA: Diagnosis not present

## 2017-10-29 DIAGNOSIS — D649 Anemia, unspecified: Secondary | ICD-10-CM | POA: Diagnosis not present

## 2017-10-29 DIAGNOSIS — R69 Illness, unspecified: Secondary | ICD-10-CM | POA: Diagnosis not present

## 2017-10-29 LAB — CBC AND DIFFERENTIAL
HCT: 40 (ref 36–46)
HEMOGLOBIN: 13.2 (ref 12.0–16.0)
NEUTROS ABS: 5
PLATELETS: 188 (ref 150–399)
WBC: 7.7

## 2017-10-29 LAB — BASIC METABOLIC PANEL
BUN: 15 (ref 4–21)
Creatinine: 1 (ref 0.5–1.1)
Glucose: 87
Potassium: 4.5 (ref 3.4–5.3)
Sodium: 143 (ref 137–147)

## 2017-10-30 DIAGNOSIS — M6281 Muscle weakness (generalized): Secondary | ICD-10-CM | POA: Diagnosis not present

## 2017-10-30 DIAGNOSIS — I2583 Coronary atherosclerosis due to lipid rich plaque: Secondary | ICD-10-CM | POA: Diagnosis not present

## 2017-10-31 ENCOUNTER — Non-Acute Institutional Stay (SKILLED_NURSING_FACILITY): Payer: Medicare HMO | Admitting: Internal Medicine

## 2017-10-31 ENCOUNTER — Encounter: Payer: Self-pay | Admitting: Internal Medicine

## 2017-10-31 DIAGNOSIS — N183 Chronic kidney disease, stage 3 unspecified: Secondary | ICD-10-CM

## 2017-10-31 DIAGNOSIS — I2510 Atherosclerotic heart disease of native coronary artery without angina pectoris: Secondary | ICD-10-CM

## 2017-10-31 DIAGNOSIS — R69 Illness, unspecified: Secondary | ICD-10-CM | POA: Diagnosis not present

## 2017-10-31 DIAGNOSIS — M6281 Muscle weakness (generalized): Secondary | ICD-10-CM | POA: Diagnosis not present

## 2017-10-31 DIAGNOSIS — I2583 Coronary atherosclerosis due to lipid rich plaque: Secondary | ICD-10-CM

## 2017-10-31 DIAGNOSIS — R5381 Other malaise: Secondary | ICD-10-CM | POA: Diagnosis not present

## 2017-10-31 DIAGNOSIS — F0281 Dementia in other diseases classified elsewhere with behavioral disturbance: Secondary | ICD-10-CM

## 2017-10-31 DIAGNOSIS — F02818 Dementia in other diseases classified elsewhere, unspecified severity, with other behavioral disturbance: Secondary | ICD-10-CM

## 2017-10-31 DIAGNOSIS — I5042 Chronic combined systolic (congestive) and diastolic (congestive) heart failure: Secondary | ICD-10-CM | POA: Diagnosis not present

## 2017-10-31 DIAGNOSIS — G301 Alzheimer's disease with late onset: Secondary | ICD-10-CM | POA: Diagnosis not present

## 2017-10-31 NOTE — Progress Notes (Signed)
Provider:  DR Arletha Grippe Location:  [place] Room Number: [place] of Service:  SNF [phone])  PCP: Arlis Porta., MD Patient Care Team: Arlis Porta., MD as PCP - General (Unknown Physician Specialty) Nahser, Wonda Cheng, MD as Consulting Physician (Cardiology)  Extended Emergency Contact Information Primary Emergency Contact: [facility] Address: [address]          [place], [place] [address] Johnnette Litter of [place] Phone: [phone] Mobile Phone: [phone] Relation: Daughter Secondary Emergency Contact: Gordy Levan [place] of [place] Phone: [phone] Relation: Son  Code Status: Full Code Goals of Care: Advanced Directive information Advanced Directives 10/31/2017  Does Patient Have a Medical Advance Directive? No  Copy of Healthcare Power of Attorney in Chart? -  Would patient like information on creating a medical advance directive? No - Patient declined  Pre-existing out of facility DNR order (yellow form or pink MOST form) -  Some encounter information is confidential and restricted. Go to Review Flowsheets activity to see all data.      Chief Complaint  Patient presents with  . New Admit To SNF    Admission    HPI: Patient is a 72 y.o. female seen today for admission to SNF following hospital stay for A/chronic combined systolic and diastolic HF, CAD, acute respiratory distress, acute UTI, abnormal ECG, CKD 3, dementia. She was diuresed with IV lasix. 2D echo revealed reduced LV function (25-35%) with akinesis. Cardio determined she was not a candidate for cardiac cath. UC (+) E coli and was tx with IV rocephin>>po keflex. Her ACEI was stopped. Prognosis was deemed poor.   Today she has no concerns. She is nonverbal. No nursing issues. Appetite reduced. Sleeps well. She is a poor historian due to dementia. Hx obtained from chart.  Past Medical History:  Diagnosis Date  . CAD (coronary artery disease)    a. s/p IL STEMI 7/11 => tx with BMS to RCA (tx at [facility]);  b.  AL STEMI 7/12 => LHC: mRCA 100% ==> PCI with BMS to RCA; LM 10%, pCFX 50%, OM1 10%, pLAD 50%, mLAD 99%, 70%  ==>  c. staged PCI: BMS to  LAD (tx at [facility])  . Cataracts, bilateral   . Chronic systolic heart failure ([place])   . Dementia   . GERD (gastroesophageal reflux disease)   . HTN (hypertension)   . Hyperlipidemia   . Ischemic cardiomyopathy    a. EF 30-35% at time of AL STEMI 7/12;   b. IMPROVED by f/u Echo 9/13:  mild LVH, EF 45-50%, mid to dist Inf, AS, Apical HK, Gr 1 diast dysfn, MAC, mod LAE  . STEMI (ST elevation myocardial infarction) ([place]) 2011; 2012   [place]; [place]   Past Surgical History:  Procedure Laterality Date  . CATARACT EXTRACTION W/ INTRAOCULAR LENS IMPLANT Left ~ 2008  . CHOLECYSTECTOMY N/A 10/07/2012   Procedure: LAPAROSCOPIC CHOLECYSTECTOMY WITH INTRAOPERATIVE CHOLANGIOGRAM;  Surgeon: Shann Medal, MD;  Location: [facility];  Service: General;  Laterality: N/A;  . CORONARY ANGIOPLASTY WITH STENT PLACEMENT  2011; 2012   RCA; RCA & LAD  . DILATION AND CURETTAGE OF UTERUS  1970's  . ERCP N/A 10/08/2012   Procedure: ENDOSCOPIC RETROGRADE CHOLANGIOPANCREATOGRAPHY (ERCP);  Surgeon: Gatha Mayer, MD;  Location: [facility];  Service: Endoscopy;  Laterality: N/A;  . ERCP W/ SPHICTEROTOMY  11/10/2012   stones removed [place]  . TONSILLECTOMY AND ADENOIDECTOMY  1980's    reports that  she quit smoking about 15 years ago. Her smoking use included cigarettes. She has a 60.00 pack-year smoking history. She has never used smokeless tobacco. She reports that she does not drink alcohol or use drugs. Social History   Socioeconomic History  . Marital status: Single    Spouse name: Not on file  . Number of children: Not on file  . Years of education: Not on file  . Highest education level: Not on file  Occupational History  . Not on file  Social Needs  . Financial resource strain: Not on file  . Food insecurity:    Worry:  Not on file    Inability: Not on file  . Transportation needs:    Medical: Not on file    Non-medical: Not on file  Tobacco Use  . Smoking status: Former Smoker    Packs/day: 2.00    Years: 30.00    Pack years: 60.00    Types: Cigarettes    Last attempt to quit: 10/25/2002    Years since quitting: 15.0  . Smokeless tobacco: Never Used  Substance and Sexual Activity  . Alcohol use: No  . Drug use: No  . Sexual activity: Never  Lifestyle  . Physical activity:    Days per week: Not on file    Minutes per session: Not on file  . Stress: Not on file  Relationships  . Social connections:    Talks on phone: Not on file    Gets together: Not on file    Attends religious service: Not on file    Active member of club or organization: Not on file    Attends meetings of clubs or organizations: Not on file    Relationship status: Not on file  . Intimate partner violence:    Fear of current or ex partner: Not on file    Emotionally abused: Not on file    Physically abused: Not on file    Forced sexual activity: Not on file  Other Topics Concern  . Not on file  Social History Narrative  . Not on file    Functional Status Survey:    Family History  Problem Relation Age of Onset  . Heart attack Father        DECEASED  . Stroke Father        DECEASED  . Leukemia Mother        DECEASED  . Heart attack Sister        ALIVE  . Hypertension Sister     Health Maintenance  Topic Date Due  . PNA vac Low Risk Adult (1 of 2 - PCV13) 11/25/2017 (Originally 01/06/2009)  . INFLUENZA VACCINE  01/24/2018 (Originally 09/26/2017)  . MAMMOGRAM  10/27/2018 (Originally 02/26/2014)  . COLONOSCOPY  12/16/2024  . TETANUS/TDAP  12/16/2024  . DEXA SCAN  Completed  . Hepatitis C Screening  Completed    No Known Allergies  Outpatient Encounter Medications as of 10/31/2017  Medication Sig  . acetaminophen (TYLENOL) 500 MG tablet Take 500 mg by mouth 2 (two) times daily as needed (knee pain).   Marland Kitchen  alum & mag hydroxide-simeth (MINTOX) 088-110-31 MG/5ML suspension Take 30 mLs by mouth every 6 (six) hours as needed for indigestion or heartburn.  . clopidogrel (PLAVIX) 75 MG tablet Take 1 tablet (75 mg total) by mouth daily.  Marland Kitchen FLUoxetine (PROZAC) 10 MG capsule Take 4 capsules (40 mg total) by mouth daily.  . furosemide (LASIX) 20 MG tablet Take 1 tablet (20  mg total) by mouth every other day. Start 8/30  . Melatonin 5 MG TABS Take 5 mg by mouth at bedtime.  . memantine (NAMENDA) 5 MG tablet Take 5 mg by mouth 2 (two) times daily.  . midodrine (PROAMATINE) 5 MG tablet Take 1 tablet (5 mg total) by mouth 2 (two) times daily with a meal.  . Multiple Vitamins-Minerals (OCUVITE ADULT 50+) CAPS Take 1 capsule by mouth every morning.  . nitroGLYCERIN (NITROSTAT) 0.4 MG SL tablet Place 1 tablet (0.4 mg total) under the tongue every 5 (five) minutes as needed for chest pain.  Marland Kitchen NYSTATIN powder Apply 1 application topically 2 (two) times daily.  . potassium chloride (K-DUR,KLOR-CON) 10 MEQ tablet Take 10 mEq by mouth every other day.  . risperiDONE (RISPERDAL) 1 MG/ML oral solution Take 1 mL (1 mg total) by mouth 2 (two) times daily.  . rosuvastatin (CRESTOR) 20 MG tablet Take 20 mg by mouth daily.  . [DISCONTINUED] potassium chloride (K-DUR) 10 MEQ tablet Start taking it 8/30 with lasix (Patient not taking: Reported on 10/31/2017)  . [DISCONTINUED] Skin Protectants, Misc. (DIMETHICONE-ZINC OXIDE) cream Apply 1 application topically daily. And as needed   No facility-administered encounter medications on file as of 10/31/2017.     Review of Systems  Unable to perform ROS: Dementia    Vitals:   10/31/17 0949  BP: 122/76  Pulse: (!) 59  Resp: 18  Temp: 98.2 F (36.8 C)  SpO2: 96%  Weight: 191 lb 6.4 oz (86.8 kg)  Height: _0  (1.575 m)   Body mass index is 35.01 kg/m. Physical Exam  Constitutional: She appears well-developed.  Frail appearing in NAD, sitting up in bed, nonverbal  HENT:    Mouth/Throat: Oropharynx is clear and moist. No oropharyngeal exudate.  MMM; no oral thrush  Eyes: Pupils are equal, round, and reactive to light. No scleral icterus.  Neck: Neck supple. Carotid bruit is present (systolic b/l). No tracheal deviation present. No thyromegaly present.  Cardiovascular: Regular rhythm and intact distal pulses. Tachycardia present. Exam reveals no gallop and no friction rub.  Murmur heard.  Systolic (radiates to carotid b/l) murmur is present with a grade of 1/6. Trace LE edema b/l; no calf TTP  Pulmonary/Chest: Effort normal and breath sounds normal. No stridor. No respiratory distress. She has no wheezes. She has no rales.  Abdominal: Soft. Normal appearance and bowel sounds are normal. She exhibits no distension and no mass. There is no hepatomegaly. There is no tenderness. There is no rigidity, no rebound and no guarding. No hernia.  obese  Musculoskeletal: She exhibits edema.  Lymphadenopathy:    She has no cervical adenopathy.  Neurological: She is alert. She has normal reflexes.  Skin: Skin is warm and dry. No rash noted.  Psychiatric: She has a normal mood and affect. Her behavior is normal. She is noncommunicative.    Labs reviewed: Basic Metabolic Panel: Recent Labs    10/18/17 0516 10/19/17 0542 10/24/17 0517 10/29/17  NA 141 141 140 143  K 3.6 3.6 4.0 4.5  CL 107 106 109  --   CO2 _1 --   GLUCOSE 87 97 91  --   BUN _2 CREATININE 1.08* 1.01* 1.06* 1.0  CALCIUM 9.4 9.9 9.9  --   MG 2.1  --   --   --   PHOS  --   --  3.5  --    Liver Function Tests: Recent Labs  10/16/17 1720 10/24/17 0517  AST 24  --   ALT 15  --   ALKPHOS 76  --   BILITOT 1.0  --   PROT 7.4  --   ALBUMIN 4.0 3.5   No results for input(s): LIPASE, AMYLASE in the last 8760 hours. No results for input(s): AMMONIA in the last 8760 hours. CBC: Recent Labs    10/16/17 1720 10/18/17 1323 10/24/17 0517 10/29/17  WBC 9.1 6.3 7.9 7.7   NEUTROABS 5.7  --  4.8 5  HGB 12.5 13.3 13.5 13.2  HCT 38.2 42.4 42.6 40  MCV 90.1 93.8 93.4  --   PLT 228 209 196 188   Cardiac Enzymes: Recent Labs    10/16/17 2156 10/17/17 0534 10/17/17 1016  TROPONINI 0.08* 0.08* 0.08*   BNP: Invalid input(s): POCBNP Lab Results  Component Value Date   HGBA1C 9.7 (A) 09/22/2009   Lab Results  Component Value Date   TSH 3.106 10/17/2017   No results found for: VITAMINB12 No results found for: FOLATE No results found for: IRON, TIBC, FERRITIN  Imaging and Procedures obtained prior to SNF admission: Dg Chest Port 1 View  Result Date: 10/16/2017 CLINICAL DATA:  Shortness of breath EXAM: PORTABLE CHEST 1 VIEW COMPARISON:  August 16, 2017 FINDINGS: No edema or consolidation. There is cardiomegaly with pulmonary vascularity normal. There is aortic atherosclerosis. No adenopathy. No bone lesions. IMPRESSION: Cardiomegaly. No edema or consolidation. There is aortic atherosclerosis. Aortic Atherosclerosis (ICD10-I70.0). Electronically Signed   By: Lowella Grip III M.D.   On: 10/16/2017 17:10    Assessment/Plan   ICD-10-CM   1. Alzheimer's dementia, late onset, with behavioral disturbance G30.1    F02.81   2. Chronic combined systolic (congestive) and diastolic (congestive) heart failure (HCC) I50.42   3. Physical deconditioning R53.81   4. CKD (chronic kidney disease), stage III (HCC) N18.3   5. Morbid obesity ([place]) E66.01   6. Coronary artery disease due to lipid rich plaque I25.10    I25.83    Cont current meds as ordered  PT/OT/ST as ordered  F/u with specialists as ordered  T/c palliative care referral  GOAL: short term rehab then long term care. Prognosis is poor overall. Communicated with nursing as pt nonverbal.  Labs/tests ordered: none    Raylie Maddison S. Perlie Gold  [facility] and Adult Medicine [address] [place], [place] [address] [phone] Cell (Monday-Friday 8 AM - 5  PM) [phone] After 5 PM and follow prompts

## 2017-11-01 DIAGNOSIS — R1311 Dysphagia, oral phase: Secondary | ICD-10-CM | POA: Diagnosis not present

## 2017-11-01 DIAGNOSIS — M6281 Muscle weakness (generalized): Secondary | ICD-10-CM | POA: Diagnosis not present

## 2017-11-01 DIAGNOSIS — I2583 Coronary atherosclerosis due to lipid rich plaque: Secondary | ICD-10-CM | POA: Diagnosis not present

## 2017-11-01 DIAGNOSIS — R69 Illness, unspecified: Secondary | ICD-10-CM | POA: Diagnosis not present

## 2017-11-01 DIAGNOSIS — R41841 Cognitive communication deficit: Secondary | ICD-10-CM | POA: Diagnosis not present

## 2017-11-04 DIAGNOSIS — R69 Illness, unspecified: Secondary | ICD-10-CM | POA: Diagnosis not present

## 2017-11-04 DIAGNOSIS — I951 Orthostatic hypotension: Secondary | ICD-10-CM | POA: Insufficient documentation

## 2017-11-04 DIAGNOSIS — R41841 Cognitive communication deficit: Secondary | ICD-10-CM | POA: Diagnosis not present

## 2017-11-04 DIAGNOSIS — F0390 Unspecified dementia without behavioral disturbance: Secondary | ICD-10-CM | POA: Insufficient documentation

## 2017-11-04 DIAGNOSIS — R1311 Dysphagia, oral phase: Secondary | ICD-10-CM | POA: Diagnosis not present

## 2017-11-04 DIAGNOSIS — E785 Hyperlipidemia, unspecified: Secondary | ICD-10-CM | POA: Insufficient documentation

## 2017-11-04 DIAGNOSIS — M6281 Muscle weakness (generalized): Secondary | ICD-10-CM | POA: Diagnosis not present

## 2017-11-04 DIAGNOSIS — F418 Other specified anxiety disorders: Secondary | ICD-10-CM | POA: Insufficient documentation

## 2017-11-04 DIAGNOSIS — I2583 Coronary atherosclerosis due to lipid rich plaque: Secondary | ICD-10-CM | POA: Diagnosis not present

## 2017-11-05 DIAGNOSIS — R69 Illness, unspecified: Secondary | ICD-10-CM | POA: Diagnosis not present

## 2017-11-05 DIAGNOSIS — R1311 Dysphagia, oral phase: Secondary | ICD-10-CM | POA: Diagnosis not present

## 2017-11-05 DIAGNOSIS — R41841 Cognitive communication deficit: Secondary | ICD-10-CM | POA: Diagnosis not present

## 2017-11-06 DIAGNOSIS — R69 Illness, unspecified: Secondary | ICD-10-CM | POA: Diagnosis not present

## 2017-11-06 DIAGNOSIS — R1311 Dysphagia, oral phase: Secondary | ICD-10-CM | POA: Diagnosis not present

## 2017-11-06 DIAGNOSIS — R41841 Cognitive communication deficit: Secondary | ICD-10-CM | POA: Diagnosis not present

## 2017-11-06 DIAGNOSIS — M6281 Muscle weakness (generalized): Secondary | ICD-10-CM | POA: Diagnosis not present

## 2017-11-06 DIAGNOSIS — I2583 Coronary atherosclerosis due to lipid rich plaque: Secondary | ICD-10-CM | POA: Diagnosis not present

## 2017-11-07 DIAGNOSIS — I2583 Coronary atherosclerosis due to lipid rich plaque: Secondary | ICD-10-CM | POA: Diagnosis not present

## 2017-11-07 DIAGNOSIS — R1311 Dysphagia, oral phase: Secondary | ICD-10-CM | POA: Diagnosis not present

## 2017-11-07 DIAGNOSIS — R41841 Cognitive communication deficit: Secondary | ICD-10-CM | POA: Diagnosis not present

## 2017-11-07 DIAGNOSIS — M6281 Muscle weakness (generalized): Secondary | ICD-10-CM | POA: Diagnosis not present

## 2017-11-07 DIAGNOSIS — R69 Illness, unspecified: Secondary | ICD-10-CM | POA: Diagnosis not present

## 2017-11-08 DIAGNOSIS — I2583 Coronary atherosclerosis due to lipid rich plaque: Secondary | ICD-10-CM | POA: Diagnosis not present

## 2017-11-08 DIAGNOSIS — M6281 Muscle weakness (generalized): Secondary | ICD-10-CM | POA: Diagnosis not present

## 2017-11-09 DIAGNOSIS — R41841 Cognitive communication deficit: Secondary | ICD-10-CM | POA: Diagnosis not present

## 2017-11-09 DIAGNOSIS — R69 Illness, unspecified: Secondary | ICD-10-CM | POA: Diagnosis not present

## 2017-11-09 DIAGNOSIS — R1311 Dysphagia, oral phase: Secondary | ICD-10-CM | POA: Diagnosis not present

## 2017-11-11 ENCOUNTER — Non-Acute Institutional Stay (SKILLED_NURSING_FACILITY): Payer: Medicare HMO

## 2017-11-11 DIAGNOSIS — R69 Illness, unspecified: Secondary | ICD-10-CM | POA: Diagnosis not present

## 2017-11-11 DIAGNOSIS — Z0000 Encounter for general adult medical examination without abnormal findings: Secondary | ICD-10-CM

## 2017-11-11 DIAGNOSIS — I2583 Coronary atherosclerosis due to lipid rich plaque: Secondary | ICD-10-CM | POA: Diagnosis not present

## 2017-11-11 DIAGNOSIS — R41841 Cognitive communication deficit: Secondary | ICD-10-CM | POA: Diagnosis not present

## 2017-11-11 DIAGNOSIS — M6281 Muscle weakness (generalized): Secondary | ICD-10-CM | POA: Diagnosis not present

## 2017-11-11 DIAGNOSIS — R1311 Dysphagia, oral phase: Secondary | ICD-10-CM | POA: Diagnosis not present

## 2017-11-11 NOTE — Patient Instructions (Signed)
Ms. Kaitlyn Simmons , Thank you for taking time to come for your Medicare Wellness Visit. I appreciate your ongoing commitment to your health goals. Please review the following plan we discussed and let me know if I can assist you in the future.   Screening recommendations/referrals: Colonoscopy up to date Mammogram excluded Bone Density up to date Recommended yearly ophthalmology/optometry visit for glaucoma screening and checkup Recommended yearly dental visit for hygiene and checkup  Vaccinations: Influenza vaccine due, will receive at [facility] Pneumococcal vaccine 13 due, ordered Tdap vaccine up to date Shingles vaccine not in past records    Advanced directives: Need a copy for chart  Conditions/risks identified: none  Next appointment: Dr. Eulas Post makes rounds   Preventive Care 35 Years and Older, Female Preventive care refers to lifestyle choices and visits with your health care provider that can promote health and wellness. What does preventive care include?  A yearly physical exam. This is also called an annual well check.  Dental exams once or twice a year.  Routine eye exams. Ask your health care provider how often you should have your eyes checked.  Personal lifestyle choices, including:  Daily care of your teeth and gums.  Regular physical activity.  Eating a healthy diet.  Avoiding tobacco and drug use.  Limiting alcohol use.  Practicing safe sex.  Taking low-dose aspirin every day.  Taking vitamin and mineral supplements as recommended by your health care provider. What happens during an annual well check? The services and screenings done by your health care provider during your annual well check will depend on your age, overall health, lifestyle risk factors, and family history of disease. Counseling  Your health care provider may ask you questions about your:  Alcohol use.  Tobacco use.  Drug use.  Emotional well-being.  Home and relationship  well-being.  Sexual activity.  Eating habits.  History of falls.  Memory and ability to understand (cognition).  Work and work Statistician.  Reproductive health. Screening  You may have the following tests or measurements:  Height, weight, and BMI.  Blood pressure.  Lipid and cholesterol levels. These may be checked every 5 years, or more frequently if you are over 29 years old.  Skin check.  Lung cancer screening. You may have this screening every year starting at age 4 if you have a 30-pack-year history of smoking and currently smoke or have quit within the past 15 years.  Fecal occult blood test (FOBT) of the stool. You may have this test every year starting at age 54.  Flexible sigmoidoscopy or colonoscopy. You may have a sigmoidoscopy every 5 years or a colonoscopy every 10 years starting at age 52.  Hepatitis C blood test.  Hepatitis B blood test.  Sexually transmitted disease (STD) testing.  Diabetes screening. This is done by checking your blood sugar (glucose) after you have not eaten for a while (fasting). You may have this done every 1-3 years.  Bone density scan. This is done to screen for osteoporosis. You may have this done starting at age 73.  Mammogram. This may be done every 1-2 years. Talk to your health care provider about how often you should have regular mammograms. Talk with your health care provider about your test results, treatment options, and if necessary, the need for more tests. Vaccines  Your health care provider may recommend certain vaccines, such as:  Influenza vaccine. This is recommended every year.  Tetanus, diphtheria, and acellular pertussis (Tdap, Td) vaccine. You may need  a Td booster every 10 years.  Zoster vaccine. You may need this after age 37.  Pneumococcal 13-valent conjugate (PCV13) vaccine. One dose is recommended after age 79.  Pneumococcal polysaccharide (PPSV23) vaccine. One dose is recommended after age  33. Talk to your health care provider about which screenings and vaccines you need and how often you need them. This information is not intended to replace advice given to you by your health care provider. Make sure you discuss any questions you have with your health care provider. Document Released: 03/11/2015 Document Revised: 11/02/2015 Document Reviewed: 12/14/2014 Elsevier Interactive Patient Education  2017 [place] Prevention in the Home Falls can cause injuries. They can happen to people of all ages. There are many things you can do to make your home safe and to help prevent falls. What can I do on the outside of my home?  Regularly fix the edges of walkways and driveways and fix any cracks.  Remove anything that might make you trip as you walk through a door, such as a raised step or threshold.  Trim any bushes or trees on the path to your home.  Use bright outdoor lighting.  Clear any walking paths of anything that might make someone trip, such as rocks or tools.  Regularly check to see if handrails are loose or broken. Make sure that both sides of any steps have handrails.  Any raised decks and porches should have guardrails on the edges.  Have any leaves, snow, or ice cleared regularly.  Use sand or salt on walking paths during winter.  Clean up any spills in your garage right away. This includes oil or grease spills. What can I do in the bathroom?  Use night lights.  Install grab bars by the toilet and in the tub and shower. Do not use towel bars as grab bars.  Use non-skid mats or decals in the tub or shower.  If you need to sit down in the shower, use a plastic, non-slip stool.  Keep the floor dry. Clean up any water that spills on the floor as soon as it happens.  Remove soap buildup in the tub or shower regularly.  Attach bath mats securely with double-sided non-slip rug tape.  Do not have throw rugs and other things on the floor that can make  you trip. What can I do in the bedroom?  Use night lights.  Make sure that you have a light by your bed that is easy to reach.  Do not use any sheets or blankets that are too big for your bed. They should not hang down onto the floor.  Have a firm chair that has side arms. You can use this for support while you get dressed.  Do not have throw rugs and other things on the floor that can make you trip. What can I do in the kitchen?  Clean up any spills right away.  Avoid walking on wet floors.  Keep items that you use a lot in easy-to-reach places.  If you need to reach something above you, use a strong step stool that has a grab bar.  Keep electrical cords out of the way.  Do not use floor polish or wax that makes floors slippery. If you must use wax, use non-skid floor wax.  Do not have throw rugs and other things on the floor that can make you trip. What can I do with my stairs?  Do not leave any items on the  stairs.  Make sure that there are handrails on both sides of the stairs and use them. Fix handrails that are broken or loose. Make sure that handrails are as long as the stairways.  Check any carpeting to make sure that it is firmly attached to the stairs. Fix any carpet that is loose or worn.  Avoid having throw rugs at the top or bottom of the stairs. If you do have throw rugs, attach them to the floor with carpet tape.  Make sure that you have a light switch at the top of the stairs and the bottom of the stairs. If you do not have them, ask someone to add them for you. What else can I do to help prevent falls?  Wear shoes that:  Do not have high heels.  Have rubber bottoms.  Are comfortable and fit you well.  Are closed at the toe. Do not wear sandals.  If you use a stepladder:  Make sure that it is fully opened. Do not climb a closed stepladder.  Make sure that both sides of the stepladder are locked into place.  Ask someone to hold it for you, if  possible.  Clearly mark and make sure that you can see:  Any grab bars or handrails.  First and last steps.  Where the edge of each step is.  Use tools that help you move around (mobility aids) if they are needed. These include:  Canes.  Walkers.  Scooters.  Crutches.  Turn on the lights when you go into a dark area. Replace any light bulbs as soon as they burn out.  Set up your furniture so you have a clear path. Avoid moving your furniture around.  If any of your floors are uneven, fix them.  If there are any pets around you, be aware of where they are.  Review your medicines with your doctor. Some medicines can make you feel dizzy. This can increase your chance of falling. Ask your doctor what other things that you can do to help prevent falls. This information is not intended to replace advice given to you by your health care provider. Make sure you discuss any questions you have with your health care provider. Document Released: 12/09/2008 Document Revised: 07/21/2015 Document Reviewed: 03/19/2014 Elsevier Interactive Patient Education  2017 [employer].

## 2017-11-11 NOTE — Progress Notes (Signed)
Subjective:   Kaitlyn Simmons is a 72 y.o. female who presents for an Initial Medicare Annual Wellness Visit at [place]; incapacitated patient unable to answer questions appropriately        Objective:    Today's Vitals   11/11/17 1147 11/11/17 1148  BP: 126/78   Pulse: 60   Temp: 98 F (36.7 C)   TempSrc: Oral   Weight: 191 lb (86.6 kg)   Height: _0  (1.575 m)   PainSc:  0-No pain   Body mass index is 34.93 kg/m.  Advanced Directives 11/11/2017 10/31/2017 10/25/2017 10/16/2017 07/10/2016 07/08/2016 09/25/2015  Does Patient Have a Medical Advance Directive? _1  No No  Copy of Healthcare Power of Attorney in Chart? - - - - - - -  Would patient like information on creating a medical advance directive? No - Patient declined No - Patient declined No - Patient declined No - Patient declined No - Patient declined - No - patient declined information  Pre-existing out of facility DNR order (yellow form or pink MOST form) - - - - - - -  Some encounter information is confidential and restricted. Go to Review Flowsheets activity to see all data.    Current Medications (verified) Outpatient Encounter Medications as of 11/11/2017  Medication Sig  . acetaminophen (TYLENOL) 500 MG tablet Take 500 mg by mouth 2 (two) times daily as needed (knee pain).   Marland Kitchen alum & mag hydroxide-simeth (MINTOX) [phone] MG/5ML suspension Take 30 mLs by mouth every 6 (six) hours as needed for indigestion or heartburn.  . clopidogrel (PLAVIX) 75 MG tablet Take 1 tablet (75 mg total) by mouth daily.  Marland Kitchen FLUoxetine (PROZAC) 10 MG capsule Take 4 capsules (40 mg total) by mouth daily.  . furosemide (LASIX) 20 MG tablet Take 1 tablet (20 mg total) by mouth every other day. Start 8/30  . Melatonin 5 MG TABS Take 5 mg by mouth at bedtime.  . memantine (NAMENDA) 5 MG tablet Take 5 mg by mouth 2 (two) times daily.  . midodrine (PROAMATINE) 5 MG tablet Take 1 tablet (5 mg total) by mouth 2 (two)  times daily with a meal.  . Multiple Vitamins-Minerals (OCUVITE ADULT 50+) CAPS Take 1 capsule by mouth every morning.  . nitroGLYCERIN (NITROSTAT) 0.4 MG SL tablet Place 1 tablet (0.4 mg total) under the tongue every 5 (five) minutes as needed for chest pain.  Marland Kitchen NYSTATIN powder Apply 1 application topically 2 (two) times daily.  . potassium chloride (K-DUR,KLOR-CON) 10 MEQ tablet Take 10 mEq by mouth every other day.  . risperiDONE (RISPERDAL) 1 MG/ML oral solution Take 1 mL (1 mg total) by mouth 2 (two) times daily.  . rosuvastatin (CRESTOR) 20 MG tablet Take 20 mg by mouth daily.   No facility-administered encounter medications on file as of 11/11/2017.     Allergies (verified) Patient has no known allergies.   History: Past Medical History:  Diagnosis Date  . CAD (coronary artery disease)    a. s/p IL STEMI 7/11 => tx with BMS to RCA ([facility] at [facility]);  b.  AL STEMI 7/12 => LHC: mRCA 100% ==> PCI with BMS to RCA; LM 10%, pCFX 50%, OM1 10%, pLAD 50%, mLAD 99%, 70%  ==>  c. staged PCI: BMS to  LAD (tx at [facility])  . Cataracts, bilateral   . Chronic systolic heart failure ([place])   . Dementia   . GERD (gastroesophageal reflux disease)   .  HTN (hypertension)   . Hyperlipidemia   . Ischemic cardiomyopathy    a. EF 30-35% at time of AL STEMI 7/12;   b. IMPROVED by f/u Echo 9/13:  mild LVH, EF 45-50%, mid to dist Inf, AS, Apical HK, Gr 1 diast dysfn, MAC, mod LAE  . STEMI (ST elevation myocardial infarction) ([place]) 2011; 2012   [place]; [place]   Past Surgical History:  Procedure Laterality Date  . CATARACT EXTRACTION W/ INTRAOCULAR LENS IMPLANT Left ~ 2008  . CHOLECYSTECTOMY N/A 10/07/2012   Procedure: LAPAROSCOPIC CHOLECYSTECTOMY WITH INTRAOPERATIVE CHOLANGIOGRAM;  Surgeon: Shann Medal, MD;  Location: [facility];  Service: General;  Laterality: N/A;  . CORONARY ANGIOPLASTY WITH STENT PLACEMENT  2011; 2012   RCA; RCA & LAD  . DILATION AND CURETTAGE OF UTERUS  1970's  . ERCP N/A 10/08/2012   Procedure:  ENDOSCOPIC RETROGRADE CHOLANGIOPANCREATOGRAPHY (ERCP);  Surgeon: Gatha Mayer, MD;  Location: [facility];  Service: Endoscopy;  Laterality: N/A;  . ERCP W/ SPHICTEROTOMY  11/10/2012   stones removed Dent  . TONSILLECTOMY AND ADENOIDECTOMY  1980's   Family History  Problem Relation Age of Onset  . Heart attack Father        DECEASED  . Stroke Father        DECEASED  . Leukemia Mother        DECEASED  . Heart attack Sister        ALIVE  . Hypertension Sister    Social History   Socioeconomic History  . Marital status: Single    Spouse name: Not on file  . Number of children: Not on file  . Years of education: Not on file  . Highest education level: Not on file  Occupational History  . Not on file  Social Needs  . Financial resource strain: Not on file  . Food insecurity:    Worry: Not on file    Inability: Not on file  . Transportation needs:    Medical: Not on file    Non-medical: Not on file  Tobacco Use  . Smoking status: Former Smoker    Packs/day: 2.00    Years: 30.00    Pack years: 60.00    Types: Cigarettes    Last attempt to quit: 10/25/2002    Years since quitting: 15.0  . Smokeless tobacco: Never Used  Substance and Sexual Activity  . Alcohol use: No  . Drug use: No  . Sexual activity: Never  Lifestyle  . Physical activity:    Days per week: Not on file    Minutes per session: Not on file  . Stress: Not on file  Relationships  . Social connections:    Talks on phone: Not on file    Gets together: Not on file    Attends religious service: Not on file    Active member of club or organization: Not on file    Attends meetings of clubs or organizations: Not on file    Relationship status: Not on file  Other Topics Concern  . Not on file  Social History Narrative  . Not on file    Tobacco Counseling Counseling given: Not Answered   Clinical Intake:  Pre-visit preparation completed: No  Pain : Faces Pain Score: 0-No pain      Nutritional Risks: None Diabetes: No  How often do you need to have someone help you when you read instructions, pamphlets, or other written materials from your doctor or pharmacy?: 5 - Always(dementia)  Interpreter  Needed?: No  Information entered by :: Tyson Dense, RN   Activities of Daily Living In your present state of health, do you have any difficulty performing the following activities: 11/11/2017 10/16/2017  Hearing? N N  Vision? N N  Difficulty concentrating or making decisions? Tempie Donning  Walking or climbing stairs? Y Y  Dressing or bathing? Y Y  Doing errands, shopping? Tempie Donning  Preparing Food and eating ? Y -  Using the Toilet? Y -  In the past six months, have you accidently leaked urine? Y -  Do you have problems with loss of bowel control? Y -  Managing your Medications? Y -  Managing your Finances? Y -  Housekeeping or managing your Housekeeping? Y -  Some recent data might be hidden     Immunizations and Health Maintenance Immunization History  Administered Date(s) Administered  . Influenza, High Dose Seasonal PF 12/17/2014, 02/04/2017  . PPD Test 06/23/2015   There are no preventive care reminders to display for this patient.  Patient Care Team: Arlis Porta., MD as PCP - General (Unknown Physician Specialty) Nahser, Wonda Cheng, MD as Consulting Physician (Cardiology)  Indicate any recent Medical Services you may have received from other than [facility] providers in the past year (date may be approximate).     Assessment:   This is a routine wellness examination for Marvel.  Hearing/Vision screen No exam data present  Dietary issues and exercise activities discussed: Current Exercise Habits: The patient does not participate in regular exercise at present, Exercise limited by: neurologic condition(s);orthopedic condition(s)  Goals   None    Depression Screen PHQ 2/9 Scores 11/11/2017 09/17/2014 09/17/2014  PHQ - 2 Score - 0 0  Exception  Documentation Medical reason - -    Fall Risk Fall Risk  11/11/2017 09/17/2014 09/17/2014  Falls in the past year? Yes No No  Number falls in past yr: 1 - -  Injury with Fall? Yes - -    Is the patient's home free of loose throw rugs in walkways, pet beds, electrical cords, etc?   yes      Grab bars in the bathroom? yes      Handrails on the stairs?   yes      Adequate lighting?   yes  Timed Get Up and Go Performed patient is unambulatory  Cognitive Function: MMSE - Mini Mental State Exam 11/11/2017  Not completed: Unable to complete        Screening Tests Health Maintenance  Topic Date Due  . PNA vac Low Risk Adult (1 of 2 - PCV13) 11/25/2017 (Originally 01/06/2009)  . INFLUENZA VACCINE  01/24/2018 (Originally 09/26/2017)  . MAMMOGRAM  10/27/2018 (Originally 02/26/2014)  . COLONOSCOPY  12/16/2024  . TETANUS/TDAP  12/16/2024  . DEXA SCAN  Completed  . Hepatitis C Screening  Completed    Qualifies for Shingles Vaccine? Not in past records  Cancer Screenings: Lung: Low Dose CT Chest recommended if Age 101-80 years, 30 pack-year currently smoking OR have quit w/in 15years. Patient does not qualify. Breast: Up to date on Mammogram? Excluded, patient cannot stand up for it Up to date of Bone Density/Dexa? Yes Colorectal: up to date  Additional Screenings:  Hepatitis C Screening: unable to appropriately accept or decline Prevnar due: ordered Flu vaccine due: will [facility]:    I have personally reviewed and addressed the Medicare Annual Wellness questionnaire and have noted the following in the patient's chart:  A. Medical and social history B. Use of alcohol, tobacco or illicit drugs  C. Current medications and supplements D. Functional ability and status E.  Nutritional status F.  Physical activity G. Advance directives H. List of other physicians I.  Hospitalizations, surgeries, and ER visits in previous 12 months J.  [place] to  include hearing, vision, cognitive, depression L. Referrals and appointments - none  In addition, I am unable to review and discuss with incapacitated patient certain preventive protocols, quality metrics, and best practice recommendations. A written personalized care plan for preventive services as well as general preventive health recommendations were provided to patient.   See attached scanned questionnaire for additional information.   Signed,   Tyson Dense, RN Nurse Health Advisor

## 2017-11-12 DIAGNOSIS — M6281 Muscle weakness (generalized): Secondary | ICD-10-CM | POA: Diagnosis not present

## 2017-11-12 DIAGNOSIS — R41841 Cognitive communication deficit: Secondary | ICD-10-CM | POA: Diagnosis not present

## 2017-11-12 DIAGNOSIS — R69 Illness, unspecified: Secondary | ICD-10-CM | POA: Diagnosis not present

## 2017-11-12 DIAGNOSIS — I2583 Coronary atherosclerosis due to lipid rich plaque: Secondary | ICD-10-CM | POA: Diagnosis not present

## 2017-11-12 DIAGNOSIS — R1311 Dysphagia, oral phase: Secondary | ICD-10-CM | POA: Diagnosis not present

## 2017-11-13 DIAGNOSIS — M6281 Muscle weakness (generalized): Secondary | ICD-10-CM | POA: Diagnosis not present

## 2017-11-13 DIAGNOSIS — I2583 Coronary atherosclerosis due to lipid rich plaque: Secondary | ICD-10-CM | POA: Diagnosis not present

## 2017-11-13 DIAGNOSIS — R1311 Dysphagia, oral phase: Secondary | ICD-10-CM | POA: Diagnosis not present

## 2017-11-13 DIAGNOSIS — R41841 Cognitive communication deficit: Secondary | ICD-10-CM | POA: Diagnosis not present

## 2017-11-13 DIAGNOSIS — R69 Illness, unspecified: Secondary | ICD-10-CM | POA: Diagnosis not present

## 2017-11-14 DIAGNOSIS — I2583 Coronary atherosclerosis due to lipid rich plaque: Secondary | ICD-10-CM | POA: Diagnosis not present

## 2017-11-14 DIAGNOSIS — R69 Illness, unspecified: Secondary | ICD-10-CM | POA: Diagnosis not present

## 2017-11-14 DIAGNOSIS — M6281 Muscle weakness (generalized): Secondary | ICD-10-CM | POA: Diagnosis not present

## 2017-11-14 DIAGNOSIS — R41841 Cognitive communication deficit: Secondary | ICD-10-CM | POA: Diagnosis not present

## 2017-11-14 DIAGNOSIS — R1311 Dysphagia, oral phase: Secondary | ICD-10-CM | POA: Diagnosis not present

## 2017-11-15 ENCOUNTER — Non-Acute Institutional Stay (SKILLED_NURSING_FACILITY): Payer: Medicare HMO | Admitting: Adult Health

## 2017-11-15 ENCOUNTER — Encounter: Payer: Self-pay | Admitting: Adult Health

## 2017-11-15 DIAGNOSIS — I951 Orthostatic hypotension: Secondary | ICD-10-CM

## 2017-11-15 DIAGNOSIS — R69 Illness, unspecified: Secondary | ICD-10-CM | POA: Diagnosis not present

## 2017-11-15 DIAGNOSIS — F0281 Dementia in other diseases classified elsewhere with behavioral disturbance: Secondary | ICD-10-CM

## 2017-11-15 DIAGNOSIS — M6281 Muscle weakness (generalized): Secondary | ICD-10-CM | POA: Diagnosis not present

## 2017-11-15 DIAGNOSIS — G301 Alzheimer's disease with late onset: Secondary | ICD-10-CM | POA: Diagnosis not present

## 2017-11-15 DIAGNOSIS — I2583 Coronary atherosclerosis due to lipid rich plaque: Secondary | ICD-10-CM

## 2017-11-15 DIAGNOSIS — I2510 Atherosclerotic heart disease of native coronary artery without angina pectoris: Secondary | ICD-10-CM

## 2017-11-15 DIAGNOSIS — I5042 Chronic combined systolic (congestive) and diastolic (congestive) heart failure: Secondary | ICD-10-CM | POA: Diagnosis not present

## 2017-11-15 DIAGNOSIS — F02818 Dementia in other diseases classified elsewhere, unspecified severity, with other behavioral disturbance: Secondary | ICD-10-CM

## 2017-11-15 NOTE — Progress Notes (Signed)
Location:   [facility] Room Number: 465 Place of Service:  SNF (31)   CODE STATUS: full code   No Known Allergies  Chief Complaint  Patient presents with  . Medical Management of Chronic Issues    Heart failure; cad; hypotension; dementia. Weekly follow up for the first 30 days post hospitalization     HPI:  She is a 72 year old short term rehab patient of this facility being seen for the management of her chronic illnesses; heart failure; cad; hypotension; dementia. She is unable to participate in the hpi or ros. There are no reports of chest pain; no edema; no changes in appetite.   Past Medical History:  Diagnosis Date  . CAD (coronary artery disease)    a. s/p [place] STEMI 7/11 => tx with BMS to RCA (tx at [facility]);  b.  AL STEMI 7/12 => LHC: mRCA 100% ==> PCI with BMS to RCA; LM 10%, pCFX 50%, OM1 10%, pLAD 50%, mLAD 99%, 70%  ==>  c. staged PCI: BMS to  LAD (tx at [facility])  . Cataracts, bilateral   . Chronic systolic heart failure ([place])   . Dementia   . GERD (gastroesophageal reflux disease)   . HTN (hypertension)   . Hyperlipidemia   . Ischemic cardiomyopathy    a. EF 30-35% at time of AL STEMI 7/12;   b. IMPROVED by f/u Echo 9/13:  mild LVH, EF 45-50%, mid to dist Inf, AS, Apical HK, Gr 1 diast dysfn, MAC, mod LAE  . STEMI (ST elevation myocardial infarction) ([place]) 2011; 2012   [place]; [place]    Past Surgical History:  Procedure Laterality Date  . CATARACT EXTRACTION W/ INTRAOCULAR LENS IMPLANT Left ~ 2008  . CHOLECYSTECTOMY N/A 10/07/2012   Procedure: LAPAROSCOPIC CHOLECYSTECTOMY WITH INTRAOPERATIVE CHOLANGIOGRAM;  Surgeon: Shann Medal, MD;  Location: [facility];  Service: General;  Laterality: N/A;  . CORONARY ANGIOPLASTY WITH STENT PLACEMENT  2011; 2012   RCA; RCA & LAD  . DILATION AND CURETTAGE OF UTERUS  1970's  . ERCP N/A 10/08/2012   Procedure: ENDOSCOPIC RETROGRADE CHOLANGIOPANCREATOGRAPHY (ERCP);  Surgeon: Gatha Mayer, MD;  Location: [facility];   Service: Endoscopy;  Laterality: N/A;  . ERCP W/ SPHICTEROTOMY  11/10/2012   stones removed [place]  . TONSILLECTOMY AND ADENOIDECTOMY  1980's    Social History   Socioeconomic History  . Marital status: Single    Spouse name: Not on file  . Number of children: Not on file  . Years of education: Not on file  . Highest education level: Not on file  Occupational History  . Not on file  Social Needs  . Financial resource strain: Not on file  . Food insecurity:    Worry: Not on file    Inability: Not on file  . Transportation needs:    Medical: Not on file    Non-medical: Not on file  Tobacco Use  . Smoking status: Former Smoker    Packs/day: 2.00    Years: 30.00    Pack years: 60.00    Types: Cigarettes    Last attempt to quit: 10/25/2002    Years since quitting: 15.0  . Smokeless tobacco: Never Used  Substance and Sexual Activity  . Alcohol use: No  . Drug use: No  . Sexual activity: Never  Lifestyle  . Physical activity:    Days per week: Not on file    Minutes per session: Not on file  . Stress: Not  on file  Relationships  . Social connections:    Talks on phone: Not on file    Gets together: Not on file    Attends religious service: Not on file    Active member of club or organization: Not on file    Attends meetings of clubs or organizations: Not on file    Relationship status: Not on file  . Intimate partner violence:    Fear of current or ex partner: Not on file    Emotionally abused: Not on file    Physically abused: Not on file    Forced sexual activity: Not on file  Other Topics Concern  . Not on file  Social History Narrative  . Not on file   Family History  Problem Relation Age of Onset  . Heart attack Father        DECEASED  . Stroke Father        DECEASED  . Leukemia Mother        DECEASED  . Heart attack Sister        ALIVE  . Hypertension Sister       VITAL SIGNS BP 122/68   Pulse 68   Temp 98 F (36.7 C)   Resp 20   Ht 5' 4"   (1.626 m)   Wt 187 lb 9.6 oz (85.1 kg)   SpO2 98%   BMI 32.20 kg/m   Outpatient Encounter Medications as of 11/15/2017  Medication Sig  . acetaminophen (TYLENOL) 500 MG tablet Take 1,000 mg by mouth 2 (two) times daily as needed (knee pain).   Marland Kitchen alum & mag hydroxide-simeth (MINTOX) 390-300-92 MG/5ML suspension Take 30 mLs by mouth every 6 (six) hours as needed for indigestion or heartburn.  . clopidogrel (PLAVIX) 75 MG tablet Take 1 tablet (75 mg total) by mouth daily.  Marland Kitchen FLUoxetine (PROZAC) 10 MG capsule Take 4 capsules (40 mg total) by mouth daily.  . furosemide (LASIX) 20 MG tablet Take 1 tablet (20 mg total) by mouth every other day. Start 8/30  . Melatonin 5 MG TABS Take 5 mg by mouth at bedtime as needed.   . memantine (NAMENDA) 5 MG tablet Take 5 mg by mouth 2 (two) times daily.  . Multiple Vitamins-Minerals (OCUVITE ADULT 50+) CAPS Take 1 capsule by mouth every morning.  . nitroGLYCERIN (NITROSTAT) 0.4 MG SL tablet Place 1 tablet (0.4 mg total) under the tongue every 5 (five) minutes as needed for chest pain.  Marland Kitchen NYSTATIN powder Apply 1 application topically 2 (two) times daily.  . potassium chloride (K-DUR,KLOR-CON) 10 MEQ tablet Take 10 mEq by mouth every other day.  . risperiDONE (RISPERDAL) 1 MG/ML oral solution Take 1 mL (1 mg total) by mouth 2 (two) times daily.  . rosuvastatin (CRESTOR) 20 MG tablet Take 20 mg by mouth daily.  . midodrine (PROAMATINE) 5 MG tablet Take 1 tablet (5 mg total) by mouth 2 (two) times daily with a meal.   No facility-administered encounter medications on file as of 11/15/2017.      SIGNIFICANT DIAGNOSTIC EXAMS  PREVIOUS:   10-16-17: chest x-ray: Cardiomegaly. No edema or consolidation. There is aortic Atherosclerosis. Aortic Atherosclerosis   10-17-17: 2-d echo:  - Left ventricle: The cavity size was normal. Systolic function was severely reduced. The estimated ejection fraction was in the range of 25% to 30%. Akinesis of the apicalanteroseptal  and apical myocardium. Severe hypokinesis of the basalinferior myocardium. Doppler parameters are consistent with abnormal left ventricular relaxation (grade 1 diastolic  dysfunction). - Aortic valve: Mildly calcified annulus. Trileaflet; normal thickness leaflets.  - Aortic root: The aortic root was mildly dilated. - Mitral valve: Mildly calcified annulus. There was mild regurgitation. - Left atrium: The atrium was moderately to severely dilated.  NO NEW EXAMS.   LABS REVIEWED: PREVIOUS  10-16-17: wbc 9.1; hgb 12.5; hct 38.2; mcv 90.1; plt 228; glucose 86; bun 26; creat 1.24; k+ 3.8; na++ 141; ca 10.0; liver normal albumin 4.0 BNP 244.4 urine culture: e-coli. D-dimer: 0.54 10-17-17: tsh 3.106 10-18-17: wbc 6.3; hgb 13.3; hct 42.4; mcv 93.8; plt 209; glucose 87; bun 19; creat 1.08; k+ 3.6; na++ 141; ca 9.4 mag 2.1  10-24-17: wbc 7.9; hgb 13.5; hct 42.6; mcv 93.4; plt 196; glucose 91; bun 15; creat 1.06 ;k+ 4.0; na++ 140; ca 9.9   NO NEW LABS  Review of Systems  Unable to perform ROS: Dementia (uncommunicative )    Physical Exam  Constitutional: She appears well-developed and well-nourished. No distress.  Neck: Carotid bruit is present. No thyromegaly present.  Bilateral    Cardiovascular: Normal rate, regular rhythm and intact distal pulses.  Murmur heard. 1/6  Pulmonary/Chest: Effort normal and breath sounds normal. No respiratory distress.  Abdominal: Soft. Bowel sounds are normal. She exhibits no distension. There is no tenderness.  Musculoskeletal: She exhibits no edema.  Is able to move extremities   Lymphadenopathy:    She has no cervical adenopathy.  Neurological: She is alert.  Skin: Skin is warm and dry. She is not diaphoretic.  Psychiatric: She has a normal mood and affect.     ASSESSMENT/ PLAN:  TODAY:   1. Chronic combined systolic and diastolic CHF: without change EF 25-30 % (10-17-17): will continue lasix 20 mg every other day.   2. Coronary artery disease due to  lipid rich plaque: status post PTCA: is stable will continue plavix 75 mg daily and has prn ntg.   3. Orthostatic hypotension: is stable b/p 122/68: will continue midodrine 5 mg twice daily   4. Alzheimer's dementia late onset with behavioral disturbance: is without change: weight is 187 pounds. Will continue namenda 5 mg twice daily   PREVIOUS   5.  Depression with anxiety: is stable will continue prozac 40 mg daily is taking melatonin 5 mg nightly   6.  Psychosis in elderly without behavioral disturbance: is without change: will continue risperdal 1 mg twice daily   7. Dyslipidemia: is stable will continue crestor 20 mg daily   8. CKD (chronic kidney disease) stage III: is stable bun 15 creat 1.06      MD is aware of resident's narcotic use and is in agreement with current plan of care. We will attempt to wean resident as apropriate   Ok Edwards NP [facility]  Contact [phone] Monday through Friday 8am- 5pm  After hours call [phone]

## 2017-11-17 DIAGNOSIS — R41841 Cognitive communication deficit: Secondary | ICD-10-CM | POA: Diagnosis not present

## 2017-11-17 DIAGNOSIS — R69 Illness, unspecified: Secondary | ICD-10-CM | POA: Diagnosis not present

## 2017-11-17 DIAGNOSIS — R1311 Dysphagia, oral phase: Secondary | ICD-10-CM | POA: Diagnosis not present

## 2017-11-18 DIAGNOSIS — I2583 Coronary atherosclerosis due to lipid rich plaque: Secondary | ICD-10-CM | POA: Diagnosis not present

## 2017-11-18 DIAGNOSIS — R1311 Dysphagia, oral phase: Secondary | ICD-10-CM | POA: Diagnosis not present

## 2017-11-18 DIAGNOSIS — R69 Illness, unspecified: Secondary | ICD-10-CM | POA: Diagnosis not present

## 2017-11-18 DIAGNOSIS — R41841 Cognitive communication deficit: Secondary | ICD-10-CM | POA: Diagnosis not present

## 2017-11-18 DIAGNOSIS — M6281 Muscle weakness (generalized): Secondary | ICD-10-CM | POA: Diagnosis not present

## 2017-11-19 DIAGNOSIS — R69 Illness, unspecified: Secondary | ICD-10-CM | POA: Diagnosis not present

## 2017-11-19 DIAGNOSIS — I2583 Coronary atherosclerosis due to lipid rich plaque: Secondary | ICD-10-CM | POA: Diagnosis not present

## 2017-11-19 DIAGNOSIS — M6281 Muscle weakness (generalized): Secondary | ICD-10-CM | POA: Diagnosis not present

## 2017-11-19 DIAGNOSIS — R1311 Dysphagia, oral phase: Secondary | ICD-10-CM | POA: Diagnosis not present

## 2017-11-19 DIAGNOSIS — R41841 Cognitive communication deficit: Secondary | ICD-10-CM | POA: Diagnosis not present

## 2017-11-20 DIAGNOSIS — M6281 Muscle weakness (generalized): Secondary | ICD-10-CM | POA: Diagnosis not present

## 2017-11-20 DIAGNOSIS — I2583 Coronary atherosclerosis due to lipid rich plaque: Secondary | ICD-10-CM | POA: Diagnosis not present

## 2017-11-20 DIAGNOSIS — R69 Illness, unspecified: Secondary | ICD-10-CM | POA: Diagnosis not present

## 2017-11-20 DIAGNOSIS — R1311 Dysphagia, oral phase: Secondary | ICD-10-CM | POA: Diagnosis not present

## 2017-11-20 DIAGNOSIS — R41841 Cognitive communication deficit: Secondary | ICD-10-CM | POA: Diagnosis not present

## 2017-11-21 ENCOUNTER — Non-Acute Institutional Stay (SKILLED_NURSING_FACILITY): Payer: Medicare HMO | Admitting: Adult Health

## 2017-11-21 ENCOUNTER — Encounter: Payer: Self-pay | Admitting: Adult Health

## 2017-11-21 DIAGNOSIS — E785 Hyperlipidemia, unspecified: Secondary | ICD-10-CM

## 2017-11-21 DIAGNOSIS — N183 Chronic kidney disease, stage 3 unspecified: Secondary | ICD-10-CM

## 2017-11-21 DIAGNOSIS — R41841 Cognitive communication deficit: Secondary | ICD-10-CM | POA: Diagnosis not present

## 2017-11-21 DIAGNOSIS — F0390 Unspecified dementia without behavioral disturbance: Secondary | ICD-10-CM | POA: Diagnosis not present

## 2017-11-21 DIAGNOSIS — F418 Other specified anxiety disorders: Secondary | ICD-10-CM

## 2017-11-21 DIAGNOSIS — M6281 Muscle weakness (generalized): Secondary | ICD-10-CM | POA: Diagnosis not present

## 2017-11-21 DIAGNOSIS — R69 Illness, unspecified: Secondary | ICD-10-CM | POA: Diagnosis not present

## 2017-11-21 DIAGNOSIS — I2583 Coronary atherosclerosis due to lipid rich plaque: Secondary | ICD-10-CM | POA: Diagnosis not present

## 2017-11-21 DIAGNOSIS — R1311 Dysphagia, oral phase: Secondary | ICD-10-CM | POA: Diagnosis not present

## 2017-11-21 NOTE — Progress Notes (Signed)
Location:   [facility] Room Number: [place] of Service:  SNF (31)   CODE STATUS: Full Code  No Known Allergies  Chief Complaint  Patient presents with  . Medical Management of Chronic Issues    CKD stage 3; dyslipidemia; depression; psychosis.     HPI:  She is a 72 year old long term resident of this facility being seen for the management of her chronic illnesses: ckd stage 3; dyslipidemia; depression; psychosis. She is unable to participate in the hpi or ros. There are no reports of uncontrolled pain; changes in appetite; no episodes of agitation.   Past Medical History:  Diagnosis Date  . CAD (coronary artery disease)    a. s/p IL STEMI 7/11 => tx with BMS to RCA (tx at [facility]);  b.  AL STEMI 7/12 => LHC: mRCA 100% ==> PCI with BMS to RCA; LM 10%, pCFX 50%, OM1 10%, pLAD 50%, mLAD 99%, 70%  ==>  c. staged PCI: BMS to  LAD (tx at [facility])  . Cataracts, bilateral   . Chronic systolic heart failure ([place])   . Dementia   . GERD (gastroesophageal reflux disease)   . HTN (hypertension)   . Hyperlipidemia   . Ischemic cardiomyopathy    a. EF 30-35% at time of AL STEMI 7/12;   b. IMPROVED by f/u Echo 9/13:  mild LVH, EF 45-50%, mid to dist Inf, AS, Apical HK, Gr 1 diast dysfn, MAC, mod LAE  . STEMI (ST elevation myocardial infarction) ([place]) 2011; 2012   [place]; [place]    Past Surgical History:  Procedure Laterality Date  . CATARACT EXTRACTION W/ INTRAOCULAR LENS IMPLANT Left ~ 2008  . CHOLECYSTECTOMY N/A 10/07/2012   Procedure: LAPAROSCOPIC CHOLECYSTECTOMY WITH INTRAOPERATIVE CHOLANGIOGRAM;  Surgeon: Shann Medal, MD;  Location: [facility];  Service: General;  Laterality: N/A;  . CORONARY ANGIOPLASTY WITH STENT PLACEMENT  2011; 2012   RCA; RCA & LAD  . DILATION AND CURETTAGE OF UTERUS  1970's  . ERCP N/A 10/08/2012   Procedure: ENDOSCOPIC RETROGRADE CHOLANGIOPANCREATOGRAPHY (ERCP);  Surgeon: Gatha Mayer, MD;  Location: [facility];  Service: Endoscopy;  Laterality: N/A;   . ERCP W/ SPHICTEROTOMY  11/10/2012   stones removed [place]  . TONSILLECTOMY AND ADENOIDECTOMY  1980's    Social History   Socioeconomic History  . Marital status: Single    Spouse name: Not on file  . Number of children: Not on file  . Years of education: Not on file  . Highest education level: Not on file  Occupational History  . Not on file  Social Needs  . Financial resource strain: Not on file  . Food insecurity:    Worry: Not on file    Inability: Not on file  . Transportation needs:    Medical: Not on file    Non-medical: Not on file  Tobacco Use  . Smoking status: Former Smoker    Packs/day: 2.00    Years: 30.00    Pack years: 60.00    Types: Cigarettes    Last attempt to quit: 10/25/2002    Years since quitting: 15.0  . Smokeless tobacco: Never Used  Substance and Sexual Activity  . Alcohol use: No  . Drug use: No  . Sexual activity: Never  Lifestyle  . Physical activity:    Days per week: Not on file    Minutes per session: Not on file  . Stress: Not on file  Relationships  . Social connections:  Talks on phone: Not on file    Gets together: Not on file    Attends religious service: Not on file    Active member of club or organization: Not on file    Attends meetings of clubs or organizations: Not on file    Relationship status: Not on file  . Intimate partner violence:    Fear of current or ex partner: Not on file    Emotionally abused: Not on file    Physically abused: Not on file    Forced sexual activity: Not on file  Other Topics Concern  . Not on file  Social History Narrative  . Not on file   Family History  Problem Relation Age of Onset  . Heart attack Father        DECEASED  . Stroke Father        DECEASED  . Leukemia Mother        DECEASED  . Heart attack Sister        ALIVE  . Hypertension Sister       VITAL SIGNS BP 100/60   Pulse (!) 58   Temp 98.3 F (36.8 C)   Resp 18   Ht _0  (1.626 m)   Wt 191 lb (86.6 kg)    SpO2 95%   BMI 32.79 kg/m   Outpatient Encounter Medications as of 11/21/2017  Medication Sig  . acetaminophen (TYLENOL) 500 MG tablet Take 1,000 mg by mouth 2 (two) times daily as needed (knee pain).   Marland Kitchen alum & mag hydroxide-simeth (MINTOX) 503-546-56 MG/5ML suspension Take 30 mLs by mouth every 6 (six) hours as needed for indigestion or heartburn.  . clopidogrel (PLAVIX) 75 MG tablet Take 1 tablet (75 mg total) by mouth daily.  Marland Kitchen FLUoxetine (PROZAC) 40 MG capsule Take 40 mg by mouth daily.  . furosemide (LASIX) 20 MG tablet Take 1 tablet (20 mg total) by mouth every other day. Start 8/30  . Melatonin 5 MG TABS Take 5 mg by mouth at bedtime as needed.   . memantine (NAMENDA) 5 MG tablet Take 5 mg by mouth 2 (two) times daily.  . midodrine (PROAMATINE) 5 MG tablet Take 1 tablet (5 mg total) by mouth 2 (two) times daily with a meal.  . Multiple Vitamin (MULTIVITAMIN) tablet Take 1 tablet by mouth daily.  . nitroGLYCERIN (NITROSTAT) 0.4 MG SL tablet Place 1 tablet (0.4 mg total) under the tongue every 5 (five) minutes as needed for chest pain.  . Nutritional Supplements (NUTRITIONAL SUPPLEMENT PO) NAS (No Added Salt) diet - Pureed texture, Regular / Thin consistency  . NYSTATIN powder Apply 1 application topically 2 (two) times daily.  . potassium chloride (K-DUR,KLOR-CON) 10 MEQ tablet Take 10 mEq by mouth every other day.  . risperiDONE (RISPERDAL) 1 MG/ML oral solution Take 1 mL (1 mg total) by mouth 2 (two) times daily.  . rosuvastatin (CRESTOR) 20 MG tablet Take 20 mg by mouth daily.  . [DISCONTINUED] FLUoxetine (PROZAC) 10 MG capsule Take 4 capsules (40 mg total) by mouth daily. (Patient not taking: Reported on 11/21/2017)  . [DISCONTINUED] Multiple Vitamins-Minerals (OCUVITE ADULT 50+) CAPS Take 1 capsule by mouth every morning.   No facility-administered encounter medications on file as of 11/21/2017.      SIGNIFICANT DIAGNOSTIC EXAMS   PREVIOUS:   10-16-17: chest x-ray:  Cardiomegaly. No edema or consolidation. There is aortic Atherosclerosis. Aortic Atherosclerosis   10-17-17: 2-d echo:  - Left ventricle: The cavity size was normal. Systolic  function was severely reduced. The estimated ejection fraction was in the range of 25% to 30%. Akinesis of the apicalanteroseptal and apical myocardium. Severe hypokinesis of the basalinferior myocardium. Doppler parameters are consistent with abnormal left ventricular relaxation (grade 1 diastolic dysfunction). - Aortic valve: Mildly calcified annulus. Trileaflet; normal thickness leaflets.  - Aortic root: The aortic root was mildly dilated. - Mitral valve: Mildly calcified annulus. There was mild regurgitation. - Left atrium: The atrium was moderately to severely dilated.  NO NEW EXAMS.   LABS REVIEWED: PREVIOUS  10-16-17: wbc 9.1; hgb 12.5; hct 38.2; mcv 90.1; plt 228; glucose 86; bun 26; creat 1.24; k+ 3.8; na++ 141; ca 10.0; liver normal albumin 4.0 BNP 244.4 urine culture: e-coli. D-dimer: 0.54 10-17-17: tsh 3.106 10-18-17: wbc 6.3; hgb 13.3; hct 42.4; mcv 93.8; plt 209; glucose 87; bun 19; creat 1.08; k+ 3.6; na++ 141; ca 9.4 mag 2.1  10-24-17: wbc 7.9; hgb 13.5; hct 42.6; mcv 93.4; plt 196; glucose 91; bun 15; creat 1.06 ;k+ 4.0; na++ 140; ca 9.9   TODAY:  10-29-17: wbc 7.7 ;hgb 13.2; hct 40.2; mcv 91.0; plt 188; glucose 87 ;bun 15.1; creat 1.01; k+ 4.5; na++ 145; ca 10.0    Review of Systems  Unable to perform ROS: Dementia (confusion )    Physical Exam  Constitutional: She appears well-developed and well-nourished. No distress.  Neck: Carotid bruit is present. No thyromegaly present.  Bilateral   Cardiovascular: Normal rate, regular rhythm and intact distal pulses.  Murmur heard. 1/6  Pulmonary/Chest: Effort normal and breath sounds normal. No respiratory distress.  Abdominal: Soft. Bowel sounds are normal. She exhibits no distension. There is no tenderness.  Musculoskeletal: She exhibits no edema.  Is  able to move all extremities   Lymphadenopathy:    She has no cervical adenopathy.  Neurological: She is alert.  Skin: Skin is warm and dry. She is not diaphoretic.  Psychiatric: She has a normal mood and affect.    ASSESSMENT/ PLAN:  TODAY:   1.  Depression with anxiety: is stable will continue prozac 40 mg daily is taking melatonin 5 mg nightly   2.  Psychosis in elderly without behavioral disturbance: is without change: will continue risperdal 1 mg twice daily   3. Dyslipidemia: is stable will continue crestor 20 mg daily   4. CKD (chronic kidney disease) stage III: is stable bun 15 creat 1.01  PREVIOUS   5. Chronic combined systolic and diastolic CHF: without change EF 25-30 % (10-17-17): will continue lasix 20 mg every other day.   6. Coronary artery disease due to lipid rich plaque: status post PTCA: is stable will continue plavix 75 mg daily and has prn ntg.   7. Orthostatic hypotension: is stable b/p 122/68: will continue midodrine 5 mg twice daily   8. Alzheimer's dementia late onset with behavioral disturbance: is without change: weight is 191 pounds. Will continue namenda 5 mg twice daily        MD is aware of resident's narcotic use and is in agreement with current plan of care. We will attempt to wean resident as apropriate   Ok Edwards NP [facility]  Contact [phone] Monday through Friday 8am- 5pm  After hours call [phone]

## 2017-11-22 IMAGING — CR DG KNEE COMPLETE 4+V*R*
5 series · 5 of 5 positions shown · non-contrast
Comparison: None.

CLINICAL DATA: 70 y/o  F; status post fall with right knee pain.

EXAM:
RIGHT KNEE - COMPLETE 4+ VIEW

[x knee ap right (1 of 4)]
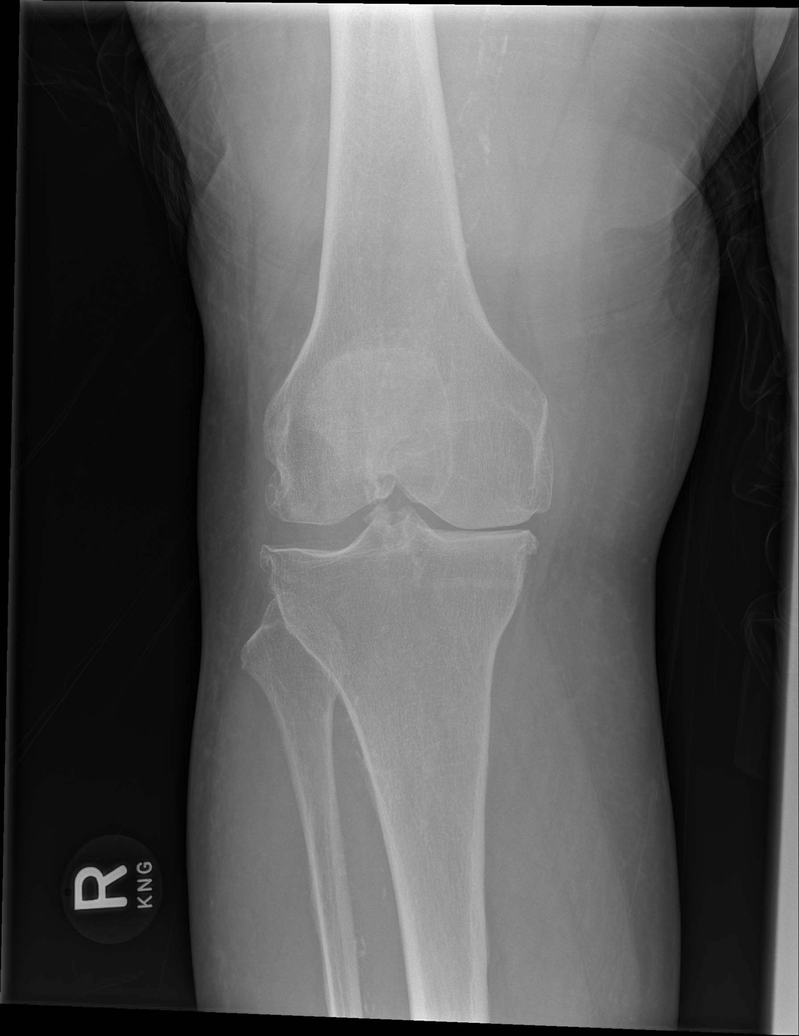

[x knee ap right (2 of 4)]
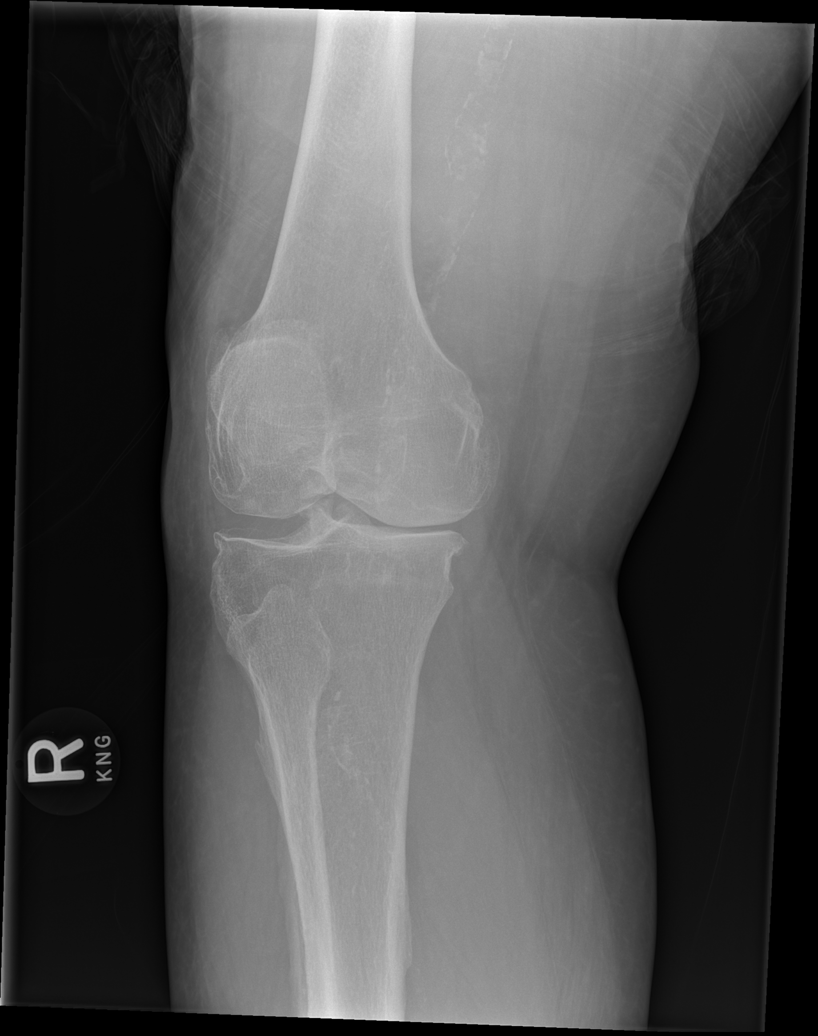

[x knee ap right (3 of 4)]
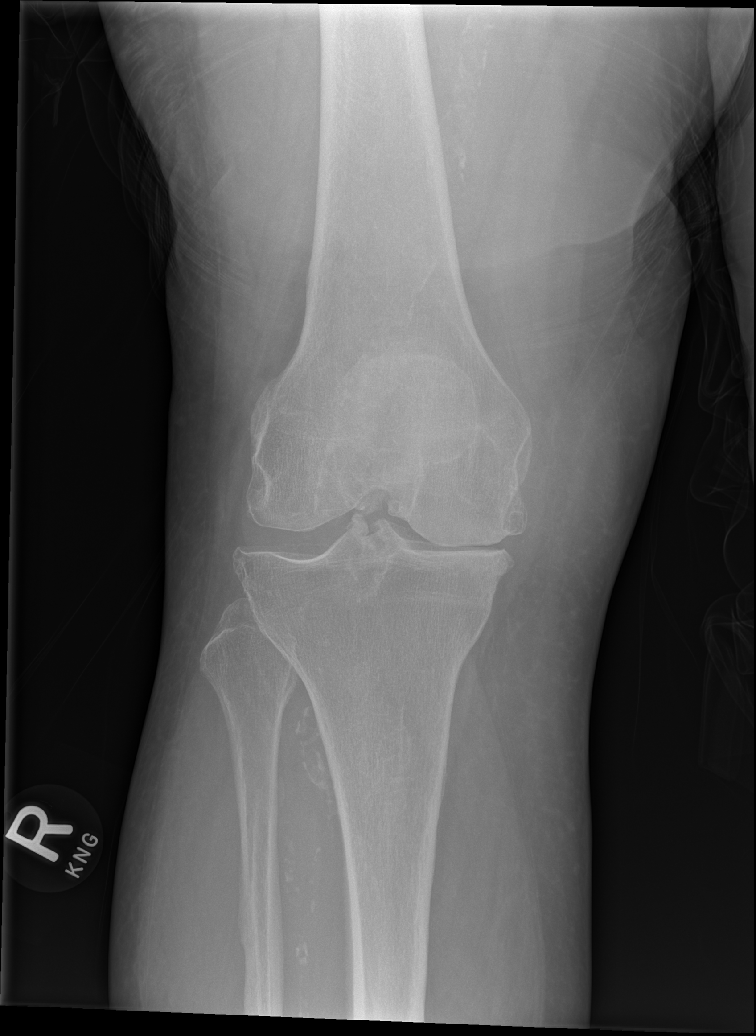

[x knee ap right (4 of 4)]
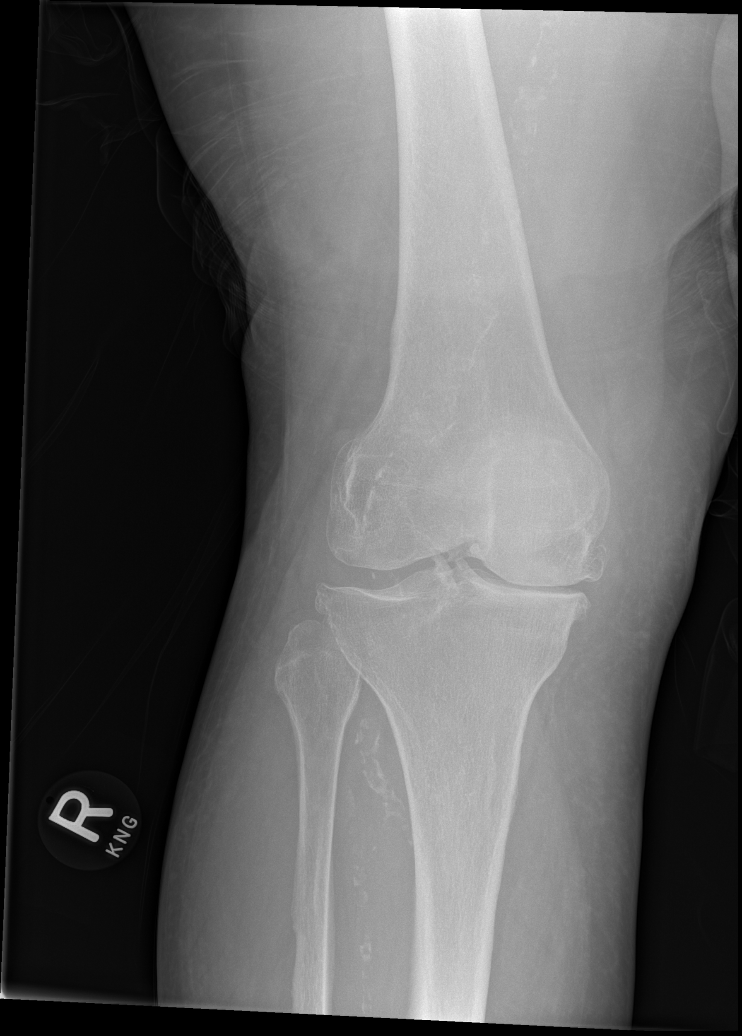

[x knee lat right]
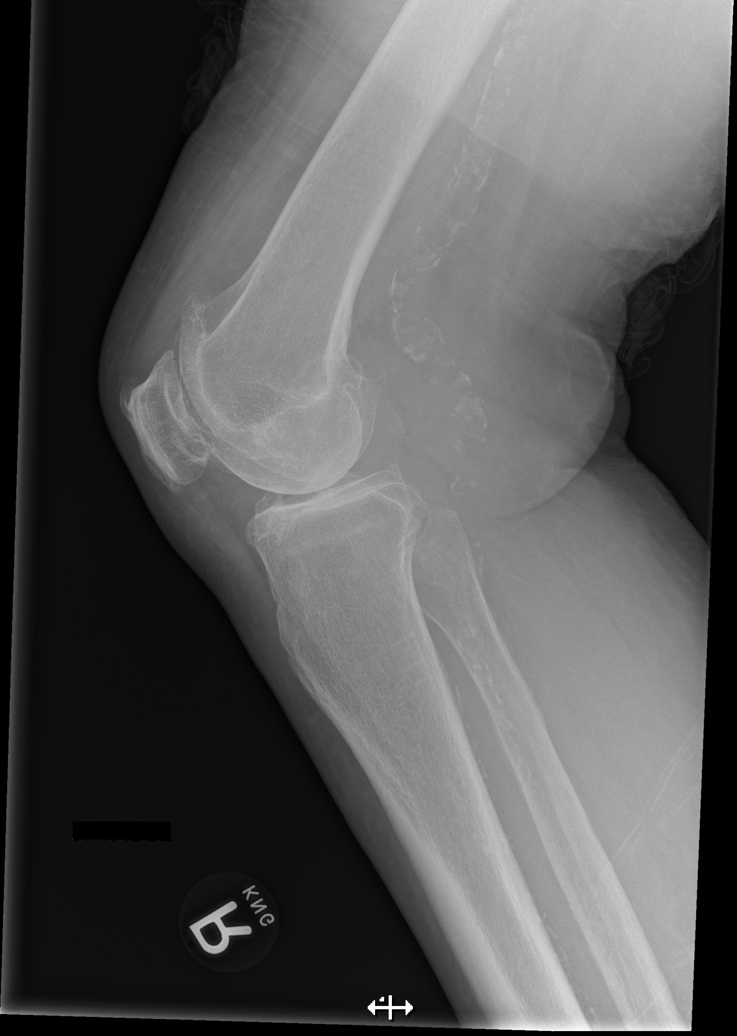

[5 of 5 positions shown; findings below may reference images not displayed]

FINDINGS: No acute fracture or dislocation identified. No significant joint
effusion. Moderate medial femorotibial compartment and
patellofemoral compartment joint space narrowing. Moderate-sized
tricompartmental osteophytes and spurring of the tibial spines.
Extensive vascular calcification.
IMPRESSION: 1. No acute fracture or dislocation identified.
2. Moderate tricompartmental osteoarthrosis greatest in medial
femorotibial and patellofemoral compartments.

By: Chilung Jahe M.D.

## 2017-11-25 ENCOUNTER — Encounter: Payer: Self-pay | Admitting: Physician Assistant

## 2017-11-25 ENCOUNTER — Ambulatory Visit (INDEPENDENT_AMBULATORY_CARE_PROVIDER_SITE_OTHER): Payer: Medicare HMO | Admitting: Physician Assistant

## 2017-11-25 VITALS — BP 100/68 | HR 60 | Ht 64.0 in

## 2017-11-25 DIAGNOSIS — I5042 Chronic combined systolic (congestive) and diastolic (congestive) heart failure: Secondary | ICD-10-CM

## 2017-11-25 DIAGNOSIS — I2510 Atherosclerotic heart disease of native coronary artery without angina pectoris: Secondary | ICD-10-CM

## 2017-11-25 NOTE — Progress Notes (Signed)
Cardiology Office Note    Date:  11/25/2017   ID:  ALAYIA MEGGISON, DOB Mar 30, 1945, MRN [id number]  PCP:  Arlis Porta., MD  Cardiologist: Dr. Acie Fredrickson   Chief Complaint: Hospital follow up  History of Present Illness:   ORPAH HAUSNER is a 72 y.o. female with PMH significant for chronic combined CHF, ICM, CAD (s/p 2011 / 2012 STEMI with BMS to RCA x2 / LADx1), HLD, HTN, CKD III (Baseline reported Cr 1-2), and dementia.  08/2010: Per care everywhere / Dr. Acie Fredrickson documentation 12/2011 - Patient s/p STEMI 08/2009 with BMS x1 to RCA at [facility] STEMI 08/2010 with LHC and BMS x2 of RCA, LAD. Documented medical management of ? blocker, ASA / Plavix, lisinopril, and statin with addition of Coreg and PRN Labetalol for SBP spikes >170 at that time. EF 30-35% at that time.   Patient last seen by Dr. Acie Fredrickson 01/04/2012 with note that pt doing well with some SOB likely in part 2/2 obesity. CHF documented as well controlled and plan for continued medical management with 11/23/2011 follow-up TTE that showed EF 45-50%, mid to dist inf myocardium, apical HK, G1DD, mild LVH, mod LAE.  Admitted 09/2017 for Acute on chronic combined systolic and diastolic CHF. Minimally elevated troponin, felt demand. BNP minimally elevated. Not in ACS pattern. LVEF down from prior. Given severity of dementia she is not a candidate for aggressive cardiac evaluation.  Continue Plavix and statin.   Here today for follow up from facility with caregiver.  No family members present.  Her caregiver.  Patient has severe dementia and does not communicate.  She sleeps on flat bed.  They have not noticed any difficulty breathing.  No lower extremity edema.  She is on low-sodium diet.  Serum creatinine normal 10/29/2017.   Past Medical History:  Diagnosis Date  . CAD (coronary artery disease)    a. s/p [place] STEMI 7/11 => tx with BMS to RCA (tx at [facility]);  b.  AL STEMI 7/12 => LHC: mRCA 100% ==> PCI with BMS to RCA; LM 10%, pCFX 50%, OM1  10%, pLAD 50%, mLAD 99%, 70%  ==>  c. staged PCI: BMS to  LAD (tx at [facility])  . Cataracts, bilateral   . Chronic systolic heart failure ([place])   . Dementia   . GERD (gastroesophageal reflux disease)   . HTN (hypertension)   . Hyperlipidemia   . Ischemic cardiomyopathy    a. EF 30-35% at time of AL STEMI 7/12;   b. IMPROVED by f/u Echo 9/13:  mild LVH, EF 45-50%, mid to dist Inf, AS, Apical HK, Gr 1 diast dysfn, MAC, mod LAE  . STEMI (ST elevation myocardial infarction) ([place]) 2011; 2012   [place]; [place]    Past Surgical History:  Procedure Laterality Date  . CATARACT EXTRACTION W/ INTRAOCULAR LENS IMPLANT Left ~ 2008  . CHOLECYSTECTOMY N/A 10/07/2012   Procedure: LAPAROSCOPIC CHOLECYSTECTOMY WITH INTRAOPERATIVE CHOLANGIOGRAM;  Surgeon: Shann Medal, MD;  Location: [facility];  Service: General;  Laterality: N/A;  . CORONARY ANGIOPLASTY WITH STENT PLACEMENT  2011; 2012   RCA; RCA & LAD  . DILATION AND CURETTAGE OF UTERUS  1970's  . ERCP N/A 10/08/2012   Procedure: ENDOSCOPIC RETROGRADE CHOLANGIOPANCREATOGRAPHY (ERCP);  Surgeon: Gatha Mayer, MD;  Location: [facility];  Service: Endoscopy;  Laterality: N/A;  . ERCP W/ SPHICTEROTOMY  11/10/2012   stones removed [place]  . TONSILLECTOMY AND ADENOIDECTOMY  1980's    Current Medications: Prior to  Admission medications   Medication Sig Start Date End Date Taking? Authorizing Provider  acetaminophen (TYLENOL) 500 MG tablet Take 1,000 mg by mouth 2 (two) times daily as needed (knee pain).     [provider]  alum & mag hydroxide-simeth ([place]) 200-200-20 MG/5ML suspension Take 30 mLs by mouth every 6 (six) hours as needed for indigestion or heartburn.    [provider]  clopidogrel (PLAVIX) 75 MG tablet Take 1 tablet (75 mg total) by mouth daily. 07/14/16   Orson Eva, MD  FLUoxetine (PROZAC) 40 MG capsule Take 40 mg by mouth daily. 10/25/17   [provider]  furosemide (LASIX) 20 MG tablet Take 1 tablet (20 mg total) by  mouth every other day. Start 8/30 10/24/17   Nita Sells, MD  Melatonin 5 MG TABS Take 5 mg by mouth at bedtime as needed.     [provider]  memantine (NAMENDA) 5 MG tablet Take 5 mg by mouth 2 (two) times daily.    [provider]  midodrine (PROAMATINE) 5 MG tablet Take 1 tablet (5 mg total) by mouth 2 (two) times daily with a meal. 10/24/17   Nita Sells, MD  Multiple Vitamin (MULTIVITAMIN) tablet Take 1 tablet by mouth daily. 11/20/17   [provider]  nitroGLYCERIN (NITROSTAT) 0.4 MG SL tablet Place 1 tablet (0.4 mg total) under the tongue every 5 (five) minutes as needed for chest pain. 06/27/15   Carrie Mew, MD  Nutritional Supplements (NUTRITIONAL SUPPLEMENT PO) NAS (No Added Salt) diet - Pureed texture, Regular / Thin consistency    [provider]  NYSTATIN powder Apply 1 application topically 2 (two) times daily. 09/11/17   [provider]  potassium chloride (K-DUR,KLOR-CON) 10 MEQ tablet Take 10 mEq by mouth every other day. 10/25/17   [provider]  risperiDONE (RISPERDAL) 1 MG/ML oral solution Take 1 mL (1 mg total) by mouth 2 (two) times daily. 10/24/17   Nita Sells, MD  rosuvastatin (CRESTOR) 20 MG tablet Take 20 mg by mouth daily. 10/24/17   [provider]    Allergies:   Patient has no known allergies.   Social History   Socioeconomic History  . Marital status: Single    Spouse name: Not on file  . Number of children: Not on file  . Years of education: Not on file  . Highest education level: Not on file  Occupational History  . Not on file  Social Needs  . Financial resource strain: Not on file  . Food insecurity:    Worry: Not on file    Inability: Not on file  . Transportation needs:    Medical: Not on file    Non-medical: Not on file  Tobacco Use  . Smoking status: Former Smoker    Packs/day: 2.00    Years: 30.00    Pack years: 60.00    Types: Cigarettes    Last  attempt to quit: 10/25/2002    Years since quitting: 15.0  . Smokeless tobacco: Never Used  Substance and Sexual Activity  . Alcohol use: No  . Drug use: No  . Sexual activity: Never  Lifestyle  . Physical activity:    Days per week: Not on file    Minutes per session: Not on file  . Stress: Not on file  Relationships  . Social connections:    Talks on phone: Not on file    Gets together: Not on file    Attends religious service: Not on  file    Active member of club or organization: Not on file    Attends meetings of clubs or organizations: Not on file    Relationship status: Not on file  Other Topics Concern  . Not on file  Social History Narrative  . Not on file     Family History:  The patient's family history includes Heart attack in her father and sister; Hypertension in her sister; Leukemia in her mother; Stroke in her father.   ROS:   Please see the history of present illness.    ROS All other systems reviewed and are negative.   PHYSICAL EXAM:   VS:  BP 100/68   Pulse 60   Ht _0  (1.626 m)   SpO2 95%   BMI 32.79 kg/m    GEN: Chronically ill-appearing female in no acute distress  HEENT: normal  Neck: no JVD, carotid bruits, or masses Cardiac: RRR; no murmurs, rubs, or gallops,no edema  Respiratory:  clear to auscultation bilaterally, normal work of breathing GI: soft, nontender, nondistended, + BS MS: Arthritic change  skin: warm and dry, no rash Neuro: Dementia, does not follow commands  psych: Dementia  Wt Readings from Last 3 Encounters:  11/21/17 191 lb (86.6 kg)  11/15/17 187 lb 9.6 oz (85.1 kg)  11/11/17 191 lb (86.6 kg)      Studies/Labs Reviewed:   EKG:  EKG is not ordered today.    Recent Labs: 10/16/2017: ALT 15; B Natriuretic Peptide 244.4 10/17/2017: TSH 3.106 10/18/2017: Magnesium 2.1 10/29/2017: BUN 15; Creatinine 1.0; Hemoglobin 13.2; Platelets 188; Potassium 4.5; Sodium 143   Lipid Panel    Component Value Date/Time   CHOL 129  10/04/2014 0922   TRIG 103 10/04/2014 0922   HDL 35 (L) 10/04/2014 0922   CHOLHDL 3.7 10/04/2014 0922   CHOLHDL 2.6 09/12/2012 0240   VLDL 14 09/12/2012 0240   LDLCALC 73 10/04/2014 0922    Additional studies/ records that were reviewed today include:   Echocardiogram: 10/17/17 Study Conclusions  - Left ventricle: The cavity size was normal. Systolic function was   severely reduced. The estimated ejection fraction was in the   range of 25% to 30%. Akinesis of the apicalanteroseptal and   apical myocardium. Severe hypokinesis of the basalinferior   myocardium. Doppler parameters are consistent with abnormal left   ventricular relaxation (grade 1 diastolic dysfunction). - Aortic valve: Mildly calcified annulus. Trileaflet; normal   thickness leaflets. Valve area (VTI): 2.18 cm^2. Valve area   (Vmax): 2.49 cm^2. Valve area (Vmean): 2.14 cm^2. - Aortic root: The aortic root was mildly dilated. - Mitral valve: Mildly calcified annulus. There was mild   regurgitation. - Left atrium: The atrium was moderately to severely dilated.    ASSESSMENT & PLAN:    1. Chronic combined CHF - LVEF down to 25-30% from prior. Not a candidate for aggressive cardiac evaluation given severity of dementia.  She is a no CODE BLUE. No BB due to resting bradycardia.  Cannot start ACE/ARB due to soft blood pressure.  Goal to prevent fall and CHF exacerbation.  May consider changing Lasix to Spironolactone as pressure allows.  Check weight daily to assess volume status. Symptomatic management.   2. CAD - As above. Continue Plavix and statin. No bleeding issue.   No change in medical therapy today. Follow up PRN.    Medication Adjustments/Labs and Tests Ordered: Current medicines are reviewed at length with the patient today.  Concerns regarding medicines are outlined above.  Medication changes, Labs and Tests ordered today are listed in the Patient Instructions below. Patient Instructions  Medication  Instructions:  Your physician recommends that you continue on your current medications as directed. Please refer to the Current Medication list given to you today.   Labwork: None ordered  Testing/Procedures: None ordered  Follow-Up: Your physician wants you to follow-up AS NEEDED  Any Other Special Instructions Will Be Listed Below (If Applicable).     If you need a refill on your cardiac medications before your next appointment, please call your pharmacy.      Jarrett Soho, [place]  11/25/2017 9:14 AM    [facility] [place], [place], [place]  [address] Phone: [phone]; Fax: [phone]

## 2017-11-25 NOTE — Patient Instructions (Signed)
Medication Instructions:  Your physician recommends that you continue on your current medications as directed. Please refer to the Current Medication list given to you today.   Labwork: None ordered  Testing/Procedures: None ordered  Follow-Up: Your physician wants you to follow-up AS NEEDED  Any Other Special Instructions Will Be Listed Below (If Applicable).     If you need a refill on your cardiac medications before your next appointment, please call your pharmacy.   

## 2017-11-28 DIAGNOSIS — R69 Illness, unspecified: Secondary | ICD-10-CM | POA: Diagnosis not present

## 2017-11-28 DIAGNOSIS — F331 Major depressive disorder, recurrent, moderate: Secondary | ICD-10-CM | POA: Diagnosis not present

## 2017-12-03 DIAGNOSIS — R2689 Other abnormalities of gait and mobility: Secondary | ICD-10-CM | POA: Diagnosis not present

## 2017-12-03 DIAGNOSIS — R69 Illness, unspecified: Secondary | ICD-10-CM | POA: Diagnosis not present

## 2017-12-04 DIAGNOSIS — R69 Illness, unspecified: Secondary | ICD-10-CM | POA: Diagnosis not present

## 2017-12-04 DIAGNOSIS — R2689 Other abnormalities of gait and mobility: Secondary | ICD-10-CM | POA: Diagnosis not present

## 2017-12-05 DIAGNOSIS — R69 Illness, unspecified: Secondary | ICD-10-CM | POA: Diagnosis not present

## 2017-12-05 DIAGNOSIS — R2689 Other abnormalities of gait and mobility: Secondary | ICD-10-CM | POA: Diagnosis not present

## 2017-12-06 DIAGNOSIS — R69 Illness, unspecified: Secondary | ICD-10-CM | POA: Diagnosis not present

## 2017-12-06 DIAGNOSIS — R2689 Other abnormalities of gait and mobility: Secondary | ICD-10-CM | POA: Diagnosis not present

## 2017-12-08 DIAGNOSIS — R2689 Other abnormalities of gait and mobility: Secondary | ICD-10-CM | POA: Diagnosis not present

## 2017-12-08 DIAGNOSIS — R69 Illness, unspecified: Secondary | ICD-10-CM | POA: Diagnosis not present

## 2017-12-10 ENCOUNTER — Non-Acute Institutional Stay (SKILLED_NURSING_FACILITY): Payer: Medicare HMO | Admitting: Adult Health

## 2017-12-10 ENCOUNTER — Encounter: Payer: Self-pay | Admitting: Adult Health

## 2017-12-10 DIAGNOSIS — I5043 Acute on chronic combined systolic (congestive) and diastolic (congestive) heart failure: Secondary | ICD-10-CM | POA: Diagnosis not present

## 2017-12-10 DIAGNOSIS — G301 Alzheimer's disease with late onset: Secondary | ICD-10-CM | POA: Diagnosis not present

## 2017-12-10 DIAGNOSIS — I2510 Atherosclerotic heart disease of native coronary artery without angina pectoris: Secondary | ICD-10-CM | POA: Diagnosis not present

## 2017-12-10 DIAGNOSIS — F0281 Dementia in other diseases classified elsewhere with behavioral disturbance: Secondary | ICD-10-CM

## 2017-12-10 DIAGNOSIS — R69 Illness, unspecified: Secondary | ICD-10-CM | POA: Diagnosis not present

## 2017-12-10 DIAGNOSIS — R2689 Other abnormalities of gait and mobility: Secondary | ICD-10-CM | POA: Diagnosis not present

## 2017-12-10 NOTE — Progress Notes (Signed)
Location:   [facility] Room Number: [place] of Service:  SNF (31)    CODE STATUS: Full Code  No Known Allergies  Chief Complaint  Patient presents with  . Discharge Note    Discharging to ALF on 12/12/17    HPI:  She is being discharged to assisted living with home health for pt/ot. The assisted living will provide her medications and will follow up with the medical provider at the receiving facility.  She had been hospitalized for heart failure and uti. She did complete her therapy. Her needs will be better met at the assisted living level of care.    Past Medical History:  Diagnosis Date  . CAD (coronary artery disease)    a. s/p [place] STEMI 7/11 => tx with BMS to RCA (tx at [facility]);  b.  AL STEMI 7/12 => LHC: mRCA 100% ==> PCI with BMS to RCA; LM 10%, pCFX 50%, OM1 10%, pLAD 50%, mLAD 99%, 70%  ==>  c. staged PCI: BMS to  LAD (tx at [facility])  . Cataracts, bilateral   . Chronic systolic heart failure ([place])   . Dementia ([place])   . GERD (gastroesophageal reflux disease)   . HTN (hypertension)   . Hyperlipidemia   . Ischemic cardiomyopathy    a. EF 30-35% at time of AL STEMI 7/12;   b. IMPROVED by f/u Echo 9/13:  mild LVH, EF 45-50%, mid to dist Inf, AS, Apical HK, Gr 1 diast dysfn, MAC, mod LAE  . STEMI (ST elevation myocardial infarction) ([place]) 2011; 2012   [place]; [place]    Past Surgical History:  Procedure Laterality Date  . CATARACT EXTRACTION W/ INTRAOCULAR LENS IMPLANT Left ~ 2008  . CHOLECYSTECTOMY N/A 10/07/2012   Procedure: LAPAROSCOPIC CHOLECYSTECTOMY WITH INTRAOPERATIVE CHOLANGIOGRAM;  Surgeon: Shann Medal, MD;  Location: [facility];  Service: General;  Laterality: N/A;  . CORONARY ANGIOPLASTY WITH STENT PLACEMENT  2011; 2012   RCA; RCA & LAD  . DILATION AND CURETTAGE OF UTERUS  1970's  . ERCP N/A 10/08/2012   Procedure: ENDOSCOPIC RETROGRADE CHOLANGIOPANCREATOGRAPHY (ERCP);  Surgeon: Gatha Mayer, MD;  Location: [facility];  Service: Endoscopy;   Laterality: N/A;  . ERCP W/ SPHICTEROTOMY  11/10/2012   stones removed [place]  . TONSILLECTOMY AND ADENOIDECTOMY  1980's    Social History   Socioeconomic History  . Marital status: Single    Spouse name: Not on file  . Number of children: Not on file  . Years of education: Not on file  . Highest education level: Not on file  Occupational History  . Not on file  Social Needs  . Financial resource strain: Not on file  . Food insecurity:    Worry: Not on file    Inability: Not on file  . Transportation needs:    Medical: Not on file    Non-medical: Not on file  Tobacco Use  . Smoking status: Former Smoker    Packs/day: 2.00    Years: 30.00    Pack years: 60.00    Types: Cigarettes    Last attempt to quit: 10/25/2002    Years since quitting: 15.1  . Smokeless tobacco: Never Used  Substance and Sexual Activity  . Alcohol use: No  . Drug use: No  . Sexual activity: Never  Lifestyle  . Physical activity:    Days per week: Not on file    Minutes per session: Not on file  . Stress: Not on file  Relationships  . Social connections:    Talks on phone: Not on file    Gets together: Not on file    Attends religious service: Not on file    Active member of club or organization: Not on file    Attends meetings of clubs or organizations: Not on file    Relationship status: Not on file  . Intimate partner violence:    Fear of current or ex partner: Not on file    Emotionally abused: Not on file    Physically abused: Not on file    Forced sexual activity: Not on file  Other Topics Concern  . Not on file  Social History Narrative  . Not on file   Family History  Problem Relation Age of Onset  . Heart attack Father        DECEASED  . Stroke Father        DECEASED  . Leukemia Mother        DECEASED  . Heart attack Sister        ALIVE  . Hypertension Sister     VITAL SIGNS BP 132/74   Ht 5' 4"  (1.626 m)   Wt 184 lb 12.8 oz (83.8 kg)   BMI 31.72 kg/m    Patient's Medications  New Prescriptions   No medications on file  Previous Medications   ACETAMINOPHEN (TYLENOL) 500 MG TABLET    Take 1,000 mg by mouth 2 (two) times daily as needed (knee pain).    ALUM & MAG HYDROXIDE-SIMETH ([place]) 462-703-50 MG/5ML SUSPENSION    Take 30 mLs by mouth every 6 (six) hours as needed for indigestion or heartburn.   CLOPIDOGREL (PLAVIX) 75 MG TABLET    Take 1 tablet (75 mg total) by mouth daily.   FLUOXETINE (PROZAC) 40 MG CAPSULE    Take 40 mg by mouth daily.   FUROSEMIDE (LASIX) 20 MG TABLET    Take 1 tablet (20 mg total) by mouth every other day. Start 8/30   MELATONIN 5 MG TABS    Take 5 mg by mouth at bedtime as needed.    MEMANTINE (NAMENDA) 5 MG TABLET    Take 5 mg by mouth 2 (two) times daily.   MIDODRINE (PROAMATINE) 5 MG TABLET    Take 1 tablet (5 mg total) by mouth 2 (two) times daily with a meal.   MULTIPLE VITAMIN (MULTIVITAMIN) TABLET    Take 1 tablet by mouth daily.   NITROGLYCERIN (NITROSTAT) 0.4 MG SL TABLET    Place 1 tablet (0.4 mg total) under the tongue every 5 (five) minutes as needed for chest pain.   NUTRITIONAL SUPPLEMENTS (NUTRITIONAL SUPPLEMENT PO)    NAS (No Added Salt) diet - Pureed texture, Regular / Thin consistency   NYSTATIN POWDER    Apply 1 application topically 2 (two) times daily.   POTASSIUM CHLORIDE (K-DUR,KLOR-CON) 10 MEQ TABLET    Take 10 mEq by mouth every other day.   RISPERIDONE (RISPERDAL) 1 MG/ML ORAL SOLUTION    Take 1 mL (1 mg total) by mouth 2 (two) times daily.   ROSUVASTATIN (CRESTOR) 20 MG TABLET    Take 20 mg by mouth daily.  Modified Medications   No medications on file  Discontinued Medications   No medications on file     SIGNIFICANT DIAGNOSTIC EXAMS  PREVIOUS:   10-16-17: chest x-ray: Cardiomegaly. No edema or consolidation. There is aortic Atherosclerosis. Aortic Atherosclerosis   10-17-17: 2-d echo:  - Left ventricle: The cavity size  was normal. Systolic function was severely reduced. The  estimated ejection fraction was in the range of 25% to 30%. Akinesis of the apicalanteroseptal and apical myocardium. Severe hypokinesis of the basalinferior myocardium. Doppler parameters are consistent with abnormal left ventricular relaxation (grade 1 diastolic dysfunction). - Aortic valve: Mildly calcified annulus. Trileaflet; normal thickness leaflets.  - Aortic root: The aortic root was mildly dilated. - Mitral valve: Mildly calcified annulus. There was mild regurgitation. - Left atrium: The atrium was moderately to severely dilated.  NO NEW EXAMS.   LABS REVIEWED: PREVIOUS  10-16-17: wbc 9.1; hgb 12.5; hct 38.2; mcv 90.1; plt 228; glucose 86; bun 26; creat 1.24; k+ 3.8; na++ 141; ca 10.0; liver normal albumin 4.0 BNP 244.4 urine culture: e-coli. D-dimer: 0.54 10-17-17: tsh 3.106 10-18-17: wbc 6.3; hgb 13.3; hct 42.4; mcv 93.8; plt 209; glucose 87; bun 19; creat 1.08; k+ 3.6; na++ 141; ca 9.4 mag 2.1  10-24-17: wbc 7.9; hgb 13.5; hct 42.6; mcv 93.4; plt 196; glucose 91; bun 15; creat 1.06 ;k+ 4.0; na++ 140; ca 9.9  10-29-17: wbc 7.7 ;hgb 13.2; hct 40.2; mcv 91.0; plt 188; glucose 87 ;bun 15.1; creat 1.01; k+ 4.5; na++ 145; ca 10.0   NO NEW LABS    Review of Systems  Unable to perform ROS: Dementia (confusion )   Physical Exam  Constitutional: She appears well-developed and well-nourished. No distress.  Neck: Carotid bruit is present. No thyromegaly present.  Bilateral   Cardiovascular: Normal rate, regular rhythm and intact distal pulses.  Murmur heard. 1/6  Pulmonary/Chest: Effort normal and breath sounds normal. No respiratory distress.  Abdominal: Soft. Bowel sounds are normal. She exhibits no distension. There is no tenderness.  Musculoskeletal: Normal range of motion. She exhibits no edema.  Lymphadenopathy:    She has no cervical adenopathy.  Neurological: She is alert.  Skin: Skin is warm and dry. She is not diaphoretic.  Psychiatric: She has a normal mood and affect.       ASSESSMENT/ PLAN:  Patient is being discharged with the following home health services: pt/ot: to evaluate and treat as indicated for gait balance strength adl training.    Patient is being discharged with the following durable medical equipment:  None required   Patient has been advised to f/u with their PCP in 1-2 weeks to bring them up to date on their rehab stay.  Social services at facility was responsible for arranging this appointment.  Pt was provided with a 30 day supply of prescriptions for medications and refills must be obtained from their PCP.  For controlled substances, a more limited supply may be provided adequate until PCP appointment only.   Time spent with patient: 20 minutes.    Ok Edwards NP [facility]  Contact [phone] Monday through Friday 8am- 5pm  After hours call [phone]

## 2017-12-11 DIAGNOSIS — R2689 Other abnormalities of gait and mobility: Secondary | ICD-10-CM | POA: Diagnosis not present

## 2017-12-11 DIAGNOSIS — R69 Illness, unspecified: Secondary | ICD-10-CM | POA: Diagnosis not present

## 2017-12-31 DIAGNOSIS — N183 Chronic kidney disease, stage 3 (moderate): Secondary | ICD-10-CM | POA: Diagnosis not present

## 2017-12-31 DIAGNOSIS — E876 Hypokalemia: Secondary | ICD-10-CM | POA: Diagnosis not present

## 2017-12-31 DIAGNOSIS — R269 Unspecified abnormalities of gait and mobility: Secondary | ICD-10-CM | POA: Diagnosis not present

## 2017-12-31 DIAGNOSIS — E785 Hyperlipidemia, unspecified: Secondary | ICD-10-CM | POA: Diagnosis not present

## 2017-12-31 DIAGNOSIS — I2510 Atherosclerotic heart disease of native coronary artery without angina pectoris: Secondary | ICD-10-CM | POA: Diagnosis not present

## 2017-12-31 DIAGNOSIS — K219 Gastro-esophageal reflux disease without esophagitis: Secondary | ICD-10-CM | POA: Diagnosis not present

## 2017-12-31 DIAGNOSIS — R079 Chest pain, unspecified: Secondary | ICD-10-CM | POA: Diagnosis not present

## 2017-12-31 DIAGNOSIS — I5042 Chronic combined systolic (congestive) and diastolic (congestive) heart failure: Secondary | ICD-10-CM | POA: Diagnosis not present

## 2017-12-31 DIAGNOSIS — I959 Hypotension, unspecified: Secondary | ICD-10-CM | POA: Diagnosis not present

## 2017-12-31 DIAGNOSIS — B379 Candidiasis, unspecified: Secondary | ICD-10-CM | POA: Diagnosis not present

## 2018-01-03 DIAGNOSIS — F0391 Unspecified dementia with behavioral disturbance: Secondary | ICD-10-CM | POA: Diagnosis not present

## 2018-01-03 DIAGNOSIS — R69 Illness, unspecified: Secondary | ICD-10-CM | POA: Diagnosis not present

## 2018-01-31 DIAGNOSIS — R69 Illness, unspecified: Secondary | ICD-10-CM | POA: Diagnosis not present

## 2018-01-31 DIAGNOSIS — G301 Alzheimer's disease with late onset: Secondary | ICD-10-CM | POA: Diagnosis not present

## 2018-01-31 DIAGNOSIS — F0281 Dementia in other diseases classified elsewhere with behavioral disturbance: Secondary | ICD-10-CM | POA: Diagnosis not present

## 2018-01-31 DIAGNOSIS — F331 Major depressive disorder, recurrent, moderate: Secondary | ICD-10-CM | POA: Diagnosis not present

## 2018-02-11 DIAGNOSIS — R079 Chest pain, unspecified: Secondary | ICD-10-CM | POA: Diagnosis not present

## 2018-02-11 DIAGNOSIS — K219 Gastro-esophageal reflux disease without esophagitis: Secondary | ICD-10-CM | POA: Diagnosis not present

## 2018-02-11 DIAGNOSIS — B379 Candidiasis, unspecified: Secondary | ICD-10-CM | POA: Diagnosis not present

## 2018-02-11 DIAGNOSIS — I959 Hypotension, unspecified: Secondary | ICD-10-CM | POA: Diagnosis not present

## 2018-02-11 DIAGNOSIS — I5042 Chronic combined systolic (congestive) and diastolic (congestive) heart failure: Secondary | ICD-10-CM | POA: Diagnosis not present

## 2018-02-11 DIAGNOSIS — I2510 Atherosclerotic heart disease of native coronary artery without angina pectoris: Secondary | ICD-10-CM | POA: Diagnosis not present

## 2018-02-11 DIAGNOSIS — E785 Hyperlipidemia, unspecified: Secondary | ICD-10-CM | POA: Diagnosis not present

## 2018-02-11 DIAGNOSIS — R269 Unspecified abnormalities of gait and mobility: Secondary | ICD-10-CM | POA: Diagnosis not present

## 2018-02-11 DIAGNOSIS — E876 Hypokalemia: Secondary | ICD-10-CM | POA: Diagnosis not present

## 2018-02-11 DIAGNOSIS — N183 Chronic kidney disease, stage 3 (moderate): Secondary | ICD-10-CM | POA: Diagnosis not present

## 2018-02-12 DIAGNOSIS — I10 Essential (primary) hypertension: Secondary | ICD-10-CM | POA: Diagnosis not present

## 2018-02-12 DIAGNOSIS — E119 Type 2 diabetes mellitus without complications: Secondary | ICD-10-CM | POA: Diagnosis not present

## 2018-02-12 DIAGNOSIS — E785 Hyperlipidemia, unspecified: Secondary | ICD-10-CM | POA: Diagnosis not present

## 2018-02-12 DIAGNOSIS — E039 Hypothyroidism, unspecified: Secondary | ICD-10-CM | POA: Diagnosis not present

## 2018-02-27 DIAGNOSIS — R69 Illness, unspecified: Secondary | ICD-10-CM | POA: Diagnosis not present

## 2018-02-27 DIAGNOSIS — F0281 Dementia in other diseases classified elsewhere with behavioral disturbance: Secondary | ICD-10-CM | POA: Diagnosis not present

## 2018-02-27 DIAGNOSIS — F064 Anxiety disorder due to known physiological condition: Secondary | ICD-10-CM | POA: Diagnosis not present

## 2018-02-27 DIAGNOSIS — G301 Alzheimer's disease with late onset: Secondary | ICD-10-CM | POA: Diagnosis not present

## 2018-03-11 DIAGNOSIS — R69 Illness, unspecified: Secondary | ICD-10-CM | POA: Diagnosis not present

## 2018-03-11 DIAGNOSIS — F064 Anxiety disorder due to known physiological condition: Secondary | ICD-10-CM | POA: Diagnosis not present

## 2018-03-11 DIAGNOSIS — F0281 Dementia in other diseases classified elsewhere with behavioral disturbance: Secondary | ICD-10-CM | POA: Diagnosis not present

## 2018-03-11 DIAGNOSIS — Z0000 Encounter for general adult medical examination without abnormal findings: Secondary | ICD-10-CM | POA: Diagnosis not present

## 2018-03-11 DIAGNOSIS — G301 Alzheimer's disease with late onset: Secondary | ICD-10-CM | POA: Diagnosis not present

## 2018-03-17 DIAGNOSIS — N183 Chronic kidney disease, stage 3 (moderate): Secondary | ICD-10-CM | POA: Diagnosis not present

## 2018-03-17 DIAGNOSIS — I2510 Atherosclerotic heart disease of native coronary artery without angina pectoris: Secondary | ICD-10-CM | POA: Diagnosis not present

## 2018-03-17 DIAGNOSIS — R269 Unspecified abnormalities of gait and mobility: Secondary | ICD-10-CM | POA: Diagnosis not present

## 2018-03-17 DIAGNOSIS — E785 Hyperlipidemia, unspecified: Secondary | ICD-10-CM | POA: Diagnosis not present

## 2018-03-17 DIAGNOSIS — E876 Hypokalemia: Secondary | ICD-10-CM | POA: Diagnosis not present

## 2018-03-17 DIAGNOSIS — B379 Candidiasis, unspecified: Secondary | ICD-10-CM | POA: Diagnosis not present

## 2018-03-17 DIAGNOSIS — R079 Chest pain, unspecified: Secondary | ICD-10-CM | POA: Diagnosis not present

## 2018-03-17 DIAGNOSIS — I959 Hypotension, unspecified: Secondary | ICD-10-CM | POA: Diagnosis not present

## 2018-03-17 DIAGNOSIS — I5042 Chronic combined systolic (congestive) and diastolic (congestive) heart failure: Secondary | ICD-10-CM | POA: Diagnosis not present

## 2018-03-17 DIAGNOSIS — K219 Gastro-esophageal reflux disease without esophagitis: Secondary | ICD-10-CM | POA: Diagnosis not present

## 2018-03-19 DIAGNOSIS — R32 Unspecified urinary incontinence: Secondary | ICD-10-CM | POA: Diagnosis not present

## 2018-03-19 DIAGNOSIS — R69 Illness, unspecified: Secondary | ICD-10-CM | POA: Diagnosis not present

## 2018-03-19 DIAGNOSIS — R159 Full incontinence of feces: Secondary | ICD-10-CM | POA: Diagnosis not present

## 2018-03-25 DIAGNOSIS — G301 Alzheimer's disease with late onset: Secondary | ICD-10-CM | POA: Diagnosis not present

## 2018-03-25 DIAGNOSIS — F0281 Dementia in other diseases classified elsewhere with behavioral disturbance: Secondary | ICD-10-CM | POA: Diagnosis not present

## 2018-03-25 DIAGNOSIS — F064 Anxiety disorder due to known physiological condition: Secondary | ICD-10-CM | POA: Diagnosis not present

## 2018-03-25 DIAGNOSIS — R69 Illness, unspecified: Secondary | ICD-10-CM | POA: Diagnosis not present

## 2018-03-28 DIAGNOSIS — E119 Type 2 diabetes mellitus without complications: Secondary | ICD-10-CM | POA: Diagnosis not present

## 2018-03-28 DIAGNOSIS — E785 Hyperlipidemia, unspecified: Secondary | ICD-10-CM | POA: Diagnosis not present

## 2018-03-30 DIAGNOSIS — R531 Weakness: Secondary | ICD-10-CM | POA: Diagnosis not present

## 2018-03-30 DIAGNOSIS — R279 Unspecified lack of coordination: Secondary | ICD-10-CM | POA: Diagnosis not present

## 2018-03-30 DIAGNOSIS — Z043 Encounter for examination and observation following other accident: Secondary | ICD-10-CM | POA: Diagnosis not present

## 2018-03-30 DIAGNOSIS — S0083XA Contusion of other part of head, initial encounter: Secondary | ICD-10-CM | POA: Diagnosis not present

## 2018-03-30 DIAGNOSIS — Z743 Need for continuous supervision: Secondary | ICD-10-CM | POA: Diagnosis not present

## 2018-03-30 DIAGNOSIS — I10 Essential (primary) hypertension: Secondary | ICD-10-CM | POA: Diagnosis not present

## 2018-03-30 DIAGNOSIS — I517 Cardiomegaly: Secondary | ICD-10-CM | POA: Diagnosis not present

## 2018-03-30 DIAGNOSIS — Y92122 Bedroom in nursing home as the place of occurrence of the external cause: Secondary | ICD-10-CM | POA: Diagnosis not present

## 2018-03-30 DIAGNOSIS — R404 Transient alteration of awareness: Secondary | ICD-10-CM | POA: Diagnosis not present

## 2018-03-30 DIAGNOSIS — W19XXXA Unspecified fall, initial encounter: Secondary | ICD-10-CM | POA: Diagnosis not present

## 2018-03-30 DIAGNOSIS — R69 Illness, unspecified: Secondary | ICD-10-CM | POA: Diagnosis not present

## 2018-03-31 DIAGNOSIS — I130 Hypertensive heart and chronic kidney disease with heart failure and stage 1 through stage 4 chronic kidney disease, or unspecified chronic kidney disease: Secondary | ICD-10-CM | POA: Diagnosis not present

## 2018-03-31 DIAGNOSIS — N183 Chronic kidney disease, stage 3 (moderate): Secondary | ICD-10-CM | POA: Diagnosis not present

## 2018-03-31 DIAGNOSIS — R1311 Dysphagia, oral phase: Secondary | ICD-10-CM | POA: Diagnosis not present

## 2018-03-31 DIAGNOSIS — I951 Orthostatic hypotension: Secondary | ICD-10-CM | POA: Diagnosis not present

## 2018-03-31 DIAGNOSIS — I2510 Atherosclerotic heart disease of native coronary artery without angina pectoris: Secondary | ICD-10-CM | POA: Diagnosis not present

## 2018-03-31 DIAGNOSIS — G301 Alzheimer's disease with late onset: Secondary | ICD-10-CM | POA: Diagnosis not present

## 2018-03-31 DIAGNOSIS — I5022 Chronic systolic (congestive) heart failure: Secondary | ICD-10-CM | POA: Diagnosis not present

## 2018-03-31 DIAGNOSIS — R69 Illness, unspecified: Secondary | ICD-10-CM | POA: Diagnosis not present

## 2018-04-03 DIAGNOSIS — G301 Alzheimer's disease with late onset: Secondary | ICD-10-CM | POA: Diagnosis not present

## 2018-04-03 DIAGNOSIS — R1311 Dysphagia, oral phase: Secondary | ICD-10-CM | POA: Diagnosis not present

## 2018-04-03 DIAGNOSIS — R69 Illness, unspecified: Secondary | ICD-10-CM | POA: Diagnosis not present

## 2018-04-03 DIAGNOSIS — I5022 Chronic systolic (congestive) heart failure: Secondary | ICD-10-CM | POA: Diagnosis not present

## 2018-04-03 DIAGNOSIS — I951 Orthostatic hypotension: Secondary | ICD-10-CM | POA: Diagnosis not present

## 2018-04-03 DIAGNOSIS — I2510 Atherosclerotic heart disease of native coronary artery without angina pectoris: Secondary | ICD-10-CM | POA: Diagnosis not present

## 2018-04-03 DIAGNOSIS — N183 Chronic kidney disease, stage 3 (moderate): Secondary | ICD-10-CM | POA: Diagnosis not present

## 2018-04-03 DIAGNOSIS — I130 Hypertensive heart and chronic kidney disease with heart failure and stage 1 through stage 4 chronic kidney disease, or unspecified chronic kidney disease: Secondary | ICD-10-CM | POA: Diagnosis not present

## 2018-04-07 DIAGNOSIS — I5022 Chronic systolic (congestive) heart failure: Secondary | ICD-10-CM | POA: Diagnosis not present

## 2018-04-07 DIAGNOSIS — N183 Chronic kidney disease, stage 3 (moderate): Secondary | ICD-10-CM | POA: Diagnosis not present

## 2018-04-07 DIAGNOSIS — I951 Orthostatic hypotension: Secondary | ICD-10-CM | POA: Diagnosis not present

## 2018-04-07 DIAGNOSIS — G301 Alzheimer's disease with late onset: Secondary | ICD-10-CM | POA: Diagnosis not present

## 2018-04-07 DIAGNOSIS — I130 Hypertensive heart and chronic kidney disease with heart failure and stage 1 through stage 4 chronic kidney disease, or unspecified chronic kidney disease: Secondary | ICD-10-CM | POA: Diagnosis not present

## 2018-04-07 DIAGNOSIS — R69 Illness, unspecified: Secondary | ICD-10-CM | POA: Diagnosis not present

## 2018-04-07 DIAGNOSIS — I2510 Atherosclerotic heart disease of native coronary artery without angina pectoris: Secondary | ICD-10-CM | POA: Diagnosis not present

## 2018-04-07 DIAGNOSIS — R1311 Dysphagia, oral phase: Secondary | ICD-10-CM | POA: Diagnosis not present

## 2018-04-08 DIAGNOSIS — E876 Hypokalemia: Secondary | ICD-10-CM | POA: Diagnosis not present

## 2018-04-08 DIAGNOSIS — F0281 Dementia in other diseases classified elsewhere with behavioral disturbance: Secondary | ICD-10-CM | POA: Diagnosis not present

## 2018-04-08 DIAGNOSIS — B379 Candidiasis, unspecified: Secondary | ICD-10-CM | POA: Diagnosis not present

## 2018-04-08 DIAGNOSIS — K219 Gastro-esophageal reflux disease without esophagitis: Secondary | ICD-10-CM | POA: Diagnosis not present

## 2018-04-08 DIAGNOSIS — F064 Anxiety disorder due to known physiological condition: Secondary | ICD-10-CM | POA: Diagnosis not present

## 2018-04-08 DIAGNOSIS — M6281 Muscle weakness (generalized): Secondary | ICD-10-CM | POA: Diagnosis not present

## 2018-04-08 DIAGNOSIS — R269 Unspecified abnormalities of gait and mobility: Secondary | ICD-10-CM | POA: Diagnosis not present

## 2018-04-08 DIAGNOSIS — N183 Chronic kidney disease, stage 3 (moderate): Secondary | ICD-10-CM | POA: Diagnosis not present

## 2018-04-08 DIAGNOSIS — E785 Hyperlipidemia, unspecified: Secondary | ICD-10-CM | POA: Diagnosis not present

## 2018-04-08 DIAGNOSIS — I2510 Atherosclerotic heart disease of native coronary artery without angina pectoris: Secondary | ICD-10-CM | POA: Diagnosis not present

## 2018-04-08 DIAGNOSIS — R079 Chest pain, unspecified: Secondary | ICD-10-CM | POA: Diagnosis not present

## 2018-04-08 DIAGNOSIS — R69 Illness, unspecified: Secondary | ICD-10-CM | POA: Diagnosis not present

## 2018-04-08 DIAGNOSIS — I959 Hypotension, unspecified: Secondary | ICD-10-CM | POA: Diagnosis not present

## 2018-04-08 DIAGNOSIS — G301 Alzheimer's disease with late onset: Secondary | ICD-10-CM | POA: Diagnosis not present

## 2018-04-09 DIAGNOSIS — I951 Orthostatic hypotension: Secondary | ICD-10-CM | POA: Diagnosis not present

## 2018-04-09 DIAGNOSIS — N183 Chronic kidney disease, stage 3 (moderate): Secondary | ICD-10-CM | POA: Diagnosis not present

## 2018-04-09 DIAGNOSIS — I5022 Chronic systolic (congestive) heart failure: Secondary | ICD-10-CM | POA: Diagnosis not present

## 2018-04-09 DIAGNOSIS — R1311 Dysphagia, oral phase: Secondary | ICD-10-CM | POA: Diagnosis not present

## 2018-04-09 DIAGNOSIS — I130 Hypertensive heart and chronic kidney disease with heart failure and stage 1 through stage 4 chronic kidney disease, or unspecified chronic kidney disease: Secondary | ICD-10-CM | POA: Diagnosis not present

## 2018-04-09 DIAGNOSIS — R69 Illness, unspecified: Secondary | ICD-10-CM | POA: Diagnosis not present

## 2018-04-09 DIAGNOSIS — G301 Alzheimer's disease with late onset: Secondary | ICD-10-CM | POA: Diagnosis not present

## 2018-04-09 DIAGNOSIS — I2510 Atherosclerotic heart disease of native coronary artery without angina pectoris: Secondary | ICD-10-CM | POA: Diagnosis not present

## 2018-04-15 DIAGNOSIS — I130 Hypertensive heart and chronic kidney disease with heart failure and stage 1 through stage 4 chronic kidney disease, or unspecified chronic kidney disease: Secondary | ICD-10-CM | POA: Diagnosis not present

## 2018-04-15 DIAGNOSIS — R69 Illness, unspecified: Secondary | ICD-10-CM | POA: Diagnosis not present

## 2018-04-15 DIAGNOSIS — I5022 Chronic systolic (congestive) heart failure: Secondary | ICD-10-CM | POA: Diagnosis not present

## 2018-04-15 DIAGNOSIS — G301 Alzheimer's disease with late onset: Secondary | ICD-10-CM | POA: Diagnosis not present

## 2018-04-15 DIAGNOSIS — H10023 Other mucopurulent conjunctivitis, bilateral: Secondary | ICD-10-CM | POA: Diagnosis not present

## 2018-04-15 DIAGNOSIS — I951 Orthostatic hypotension: Secondary | ICD-10-CM | POA: Diagnosis not present

## 2018-04-15 DIAGNOSIS — R1311 Dysphagia, oral phase: Secondary | ICD-10-CM | POA: Diagnosis not present

## 2018-04-15 DIAGNOSIS — I2510 Atherosclerotic heart disease of native coronary artery without angina pectoris: Secondary | ICD-10-CM | POA: Diagnosis not present

## 2018-04-15 DIAGNOSIS — N183 Chronic kidney disease, stage 3 (moderate): Secondary | ICD-10-CM | POA: Diagnosis not present

## 2018-04-21 DIAGNOSIS — R69 Illness, unspecified: Secondary | ICD-10-CM | POA: Diagnosis not present

## 2018-04-21 DIAGNOSIS — R159 Full incontinence of feces: Secondary | ICD-10-CM | POA: Diagnosis not present

## 2018-04-21 DIAGNOSIS — R32 Unspecified urinary incontinence: Secondary | ICD-10-CM | POA: Diagnosis not present

## 2018-04-23 DIAGNOSIS — R9431 Abnormal electrocardiogram [ECG] [EKG]: Secondary | ICD-10-CM | POA: Diagnosis not present

## 2018-04-23 DIAGNOSIS — T1490XA Injury, unspecified, initial encounter: Secondary | ICD-10-CM | POA: Diagnosis not present

## 2018-04-23 DIAGNOSIS — R4182 Altered mental status, unspecified: Secondary | ICD-10-CM | POA: Diagnosis not present

## 2018-04-23 DIAGNOSIS — S0180XA Unspecified open wound of other part of head, initial encounter: Secondary | ICD-10-CM | POA: Diagnosis not present

## 2018-04-23 DIAGNOSIS — Z9181 History of falling: Secondary | ICD-10-CM | POA: Diagnosis not present

## 2018-04-23 DIAGNOSIS — S0181XA Laceration without foreign body of other part of head, initial encounter: Secondary | ICD-10-CM | POA: Diagnosis not present

## 2018-04-23 DIAGNOSIS — Z7902 Long term (current) use of antithrombotics/antiplatelets: Secondary | ICD-10-CM | POA: Diagnosis not present

## 2018-04-23 DIAGNOSIS — E785 Hyperlipidemia, unspecified: Secondary | ICD-10-CM | POA: Diagnosis not present

## 2018-04-23 DIAGNOSIS — I509 Heart failure, unspecified: Secondary | ICD-10-CM | POA: Diagnosis not present

## 2018-04-23 DIAGNOSIS — I130 Hypertensive heart and chronic kidney disease with heart failure and stage 1 through stage 4 chronic kidney disease, or unspecified chronic kidney disease: Secondary | ICD-10-CM | POA: Diagnosis not present

## 2018-04-23 DIAGNOSIS — R0902 Hypoxemia: Secondary | ICD-10-CM | POA: Diagnosis not present

## 2018-04-23 DIAGNOSIS — N183 Chronic kidney disease, stage 3 (moderate): Secondary | ICD-10-CM | POA: Diagnosis not present

## 2018-04-23 DIAGNOSIS — Z79899 Other long term (current) drug therapy: Secondary | ICD-10-CM | POA: Diagnosis not present

## 2018-04-23 DIAGNOSIS — W19XXXD Unspecified fall, subsequent encounter: Secondary | ICD-10-CM | POA: Diagnosis not present

## 2018-04-23 DIAGNOSIS — W19XXXA Unspecified fall, initial encounter: Secondary | ICD-10-CM | POA: Diagnosis not present

## 2018-04-23 DIAGNOSIS — R296 Repeated falls: Secondary | ICD-10-CM | POA: Diagnosis not present

## 2018-04-23 DIAGNOSIS — R7989 Other specified abnormal findings of blood chemistry: Secondary | ICD-10-CM | POA: Diagnosis not present

## 2018-04-23 DIAGNOSIS — R58 Hemorrhage, not elsewhere classified: Secondary | ICD-10-CM | POA: Diagnosis not present

## 2018-04-24 DIAGNOSIS — R7989 Other specified abnormal findings of blood chemistry: Secondary | ICD-10-CM | POA: Diagnosis not present

## 2018-04-24 DIAGNOSIS — N183 Chronic kidney disease, stage 3 (moderate): Secondary | ICD-10-CM | POA: Diagnosis not present

## 2018-04-24 DIAGNOSIS — I313 Pericardial effusion (noninflammatory): Secondary | ICD-10-CM | POA: Diagnosis not present

## 2018-04-24 DIAGNOSIS — I519 Heart disease, unspecified: Secondary | ICD-10-CM | POA: Diagnosis not present

## 2018-04-24 DIAGNOSIS — I083 Combined rheumatic disorders of mitral, aortic and tricuspid valves: Secondary | ICD-10-CM | POA: Diagnosis not present

## 2018-04-24 DIAGNOSIS — E785 Hyperlipidemia, unspecified: Secondary | ICD-10-CM | POA: Diagnosis not present

## 2018-04-24 DIAGNOSIS — I10 Essential (primary) hypertension: Secondary | ICD-10-CM | POA: Diagnosis not present

## 2018-04-24 DIAGNOSIS — I509 Heart failure, unspecified: Secondary | ICD-10-CM | POA: Diagnosis not present

## 2018-04-24 DIAGNOSIS — I517 Cardiomegaly: Secondary | ICD-10-CM | POA: Diagnosis not present

## 2018-04-24 DIAGNOSIS — R69 Illness, unspecified: Secondary | ICD-10-CM | POA: Diagnosis not present

## 2018-04-24 DIAGNOSIS — W19XXXD Unspecified fall, subsequent encounter: Secondary | ICD-10-CM | POA: Diagnosis not present

## 2018-04-25 DIAGNOSIS — R7989 Other specified abnormal findings of blood chemistry: Secondary | ICD-10-CM | POA: Diagnosis not present

## 2018-04-25 DIAGNOSIS — Z743 Need for continuous supervision: Secondary | ICD-10-CM | POA: Diagnosis not present

## 2018-04-25 DIAGNOSIS — I509 Heart failure, unspecified: Secondary | ICD-10-CM | POA: Diagnosis not present

## 2018-04-25 DIAGNOSIS — I10 Essential (primary) hypertension: Secondary | ICD-10-CM | POA: Diagnosis not present

## 2018-04-25 DIAGNOSIS — E785 Hyperlipidemia, unspecified: Secondary | ICD-10-CM | POA: Diagnosis not present

## 2018-04-25 DIAGNOSIS — R279 Unspecified lack of coordination: Secondary | ICD-10-CM | POA: Diagnosis not present

## 2018-04-25 DIAGNOSIS — N183 Chronic kidney disease, stage 3 (moderate): Secondary | ICD-10-CM | POA: Diagnosis not present

## 2018-04-25 DIAGNOSIS — W19XXXD Unspecified fall, subsequent encounter: Secondary | ICD-10-CM | POA: Diagnosis not present

## 2018-04-25 DIAGNOSIS — R4182 Altered mental status, unspecified: Secondary | ICD-10-CM | POA: Diagnosis not present

## 2018-04-25 DIAGNOSIS — R69 Illness, unspecified: Secondary | ICD-10-CM | POA: Diagnosis not present

## 2018-04-26 DIAGNOSIS — S0003XA Contusion of scalp, initial encounter: Secondary | ICD-10-CM | POA: Diagnosis not present

## 2018-04-26 DIAGNOSIS — G301 Alzheimer's disease with late onset: Secondary | ICD-10-CM | POA: Diagnosis not present

## 2018-04-26 DIAGNOSIS — M2550 Pain in unspecified joint: Secondary | ICD-10-CM | POA: Diagnosis not present

## 2018-04-26 DIAGNOSIS — G8911 Acute pain due to trauma: Secondary | ICD-10-CM | POA: Diagnosis not present

## 2018-04-26 DIAGNOSIS — Z7401 Bed confinement status: Secondary | ICD-10-CM | POA: Diagnosis not present

## 2018-04-26 DIAGNOSIS — S0990XA Unspecified injury of head, initial encounter: Secondary | ICD-10-CM | POA: Diagnosis not present

## 2018-04-26 DIAGNOSIS — R269 Unspecified abnormalities of gait and mobility: Secondary | ICD-10-CM | POA: Diagnosis not present

## 2018-04-26 DIAGNOSIS — M6281 Muscle weakness (generalized): Secondary | ICD-10-CM | POA: Diagnosis not present

## 2018-04-26 DIAGNOSIS — S199XXA Unspecified injury of neck, initial encounter: Secondary | ICD-10-CM | POA: Diagnosis not present

## 2018-04-26 DIAGNOSIS — R69 Illness, unspecified: Secondary | ICD-10-CM | POA: Diagnosis not present

## 2018-04-26 DIAGNOSIS — Z79899 Other long term (current) drug therapy: Secondary | ICD-10-CM | POA: Diagnosis not present

## 2018-04-26 DIAGNOSIS — S0083XA Contusion of other part of head, initial encounter: Secondary | ICD-10-CM | POA: Diagnosis not present

## 2018-04-26 DIAGNOSIS — I509 Heart failure, unspecified: Secondary | ICD-10-CM | POA: Diagnosis not present

## 2018-04-26 DIAGNOSIS — W19XXXA Unspecified fall, initial encounter: Secondary | ICD-10-CM | POA: Diagnosis not present

## 2018-04-26 DIAGNOSIS — R404 Transient alteration of awareness: Secondary | ICD-10-CM | POA: Diagnosis not present

## 2018-04-26 DIAGNOSIS — Z9181 History of falling: Secondary | ICD-10-CM | POA: Diagnosis not present

## 2018-04-26 DIAGNOSIS — I110 Hypertensive heart disease with heart failure: Secondary | ICD-10-CM | POA: Diagnosis not present

## 2018-04-29 DIAGNOSIS — I2510 Atherosclerotic heart disease of native coronary artery without angina pectoris: Secondary | ICD-10-CM | POA: Diagnosis not present

## 2018-04-29 DIAGNOSIS — W1839XA Other fall on same level, initial encounter: Secondary | ICD-10-CM | POA: Diagnosis not present

## 2018-05-06 DIAGNOSIS — K219 Gastro-esophageal reflux disease without esophagitis: Secondary | ICD-10-CM | POA: Diagnosis not present

## 2018-05-06 DIAGNOSIS — E876 Hypokalemia: Secondary | ICD-10-CM | POA: Diagnosis not present

## 2018-05-06 DIAGNOSIS — R269 Unspecified abnormalities of gait and mobility: Secondary | ICD-10-CM | POA: Diagnosis not present

## 2018-05-06 DIAGNOSIS — R079 Chest pain, unspecified: Secondary | ICD-10-CM | POA: Diagnosis not present

## 2018-05-06 DIAGNOSIS — I5042 Chronic combined systolic (congestive) and diastolic (congestive) heart failure: Secondary | ICD-10-CM | POA: Diagnosis not present

## 2018-05-06 DIAGNOSIS — N183 Chronic kidney disease, stage 3 (moderate): Secondary | ICD-10-CM | POA: Diagnosis not present

## 2018-05-06 DIAGNOSIS — B379 Candidiasis, unspecified: Secondary | ICD-10-CM | POA: Diagnosis not present

## 2018-05-06 DIAGNOSIS — M6281 Muscle weakness (generalized): Secondary | ICD-10-CM | POA: Diagnosis not present

## 2018-05-06 DIAGNOSIS — I2510 Atherosclerotic heart disease of native coronary artery without angina pectoris: Secondary | ICD-10-CM | POA: Diagnosis not present

## 2018-05-06 DIAGNOSIS — W1839XA Other fall on same level, initial encounter: Secondary | ICD-10-CM | POA: Diagnosis not present

## 2018-05-13 DIAGNOSIS — R69 Illness, unspecified: Secondary | ICD-10-CM | POA: Diagnosis not present

## 2018-05-13 DIAGNOSIS — F064 Anxiety disorder due to known physiological condition: Secondary | ICD-10-CM | POA: Diagnosis not present

## 2018-05-13 DIAGNOSIS — F0281 Dementia in other diseases classified elsewhere with behavioral disturbance: Secondary | ICD-10-CM | POA: Diagnosis not present

## 2018-05-13 DIAGNOSIS — G301 Alzheimer's disease with late onset: Secondary | ICD-10-CM | POA: Diagnosis not present

## 2018-05-27 DIAGNOSIS — F064 Anxiety disorder due to known physiological condition: Secondary | ICD-10-CM | POA: Diagnosis not present

## 2018-05-27 DIAGNOSIS — R69 Illness, unspecified: Secondary | ICD-10-CM | POA: Diagnosis not present

## 2018-05-27 DIAGNOSIS — F0281 Dementia in other diseases classified elsewhere with behavioral disturbance: Secondary | ICD-10-CM | POA: Diagnosis not present

## 2018-05-27 DIAGNOSIS — G301 Alzheimer's disease with late onset: Secondary | ICD-10-CM | POA: Diagnosis not present

## 2018-06-10 DIAGNOSIS — N183 Chronic kidney disease, stage 3 (moderate): Secondary | ICD-10-CM | POA: Diagnosis not present

## 2018-06-10 DIAGNOSIS — B379 Candidiasis, unspecified: Secondary | ICD-10-CM | POA: Diagnosis not present

## 2018-06-10 DIAGNOSIS — R079 Chest pain, unspecified: Secondary | ICD-10-CM | POA: Diagnosis not present

## 2018-06-10 DIAGNOSIS — I5042 Chronic combined systolic (congestive) and diastolic (congestive) heart failure: Secondary | ICD-10-CM | POA: Diagnosis not present

## 2018-06-10 DIAGNOSIS — I2510 Atherosclerotic heart disease of native coronary artery without angina pectoris: Secondary | ICD-10-CM | POA: Diagnosis not present

## 2018-06-10 DIAGNOSIS — E876 Hypokalemia: Secondary | ICD-10-CM | POA: Diagnosis not present

## 2018-06-10 DIAGNOSIS — W1839XA Other fall on same level, initial encounter: Secondary | ICD-10-CM | POA: Diagnosis not present

## 2018-06-10 DIAGNOSIS — K219 Gastro-esophageal reflux disease without esophagitis: Secondary | ICD-10-CM | POA: Diagnosis not present

## 2018-06-10 DIAGNOSIS — M6281 Muscle weakness (generalized): Secondary | ICD-10-CM | POA: Diagnosis not present

## 2018-06-10 DIAGNOSIS — R269 Unspecified abnormalities of gait and mobility: Secondary | ICD-10-CM | POA: Diagnosis not present

## 2018-06-17 DIAGNOSIS — I739 Peripheral vascular disease, unspecified: Secondary | ICD-10-CM | POA: Diagnosis not present

## 2018-06-17 DIAGNOSIS — L603 Nail dystrophy: Secondary | ICD-10-CM | POA: Diagnosis not present

## 2018-06-17 DIAGNOSIS — Q845 Enlarged and hypertrophic nails: Secondary | ICD-10-CM | POA: Diagnosis not present

## 2018-06-17 DIAGNOSIS — B351 Tinea unguium: Secondary | ICD-10-CM | POA: Diagnosis not present

## 2018-06-24 DIAGNOSIS — E039 Hypothyroidism, unspecified: Secondary | ICD-10-CM | POA: Diagnosis not present

## 2018-06-24 DIAGNOSIS — E785 Hyperlipidemia, unspecified: Secondary | ICD-10-CM | POA: Diagnosis not present

## 2018-06-24 DIAGNOSIS — I10 Essential (primary) hypertension: Secondary | ICD-10-CM | POA: Diagnosis not present

## 2018-07-08 DIAGNOSIS — R079 Chest pain, unspecified: Secondary | ICD-10-CM | POA: Diagnosis not present

## 2018-07-08 DIAGNOSIS — M6281 Muscle weakness (generalized): Secondary | ICD-10-CM | POA: Diagnosis not present

## 2018-07-08 DIAGNOSIS — K219 Gastro-esophageal reflux disease without esophagitis: Secondary | ICD-10-CM | POA: Diagnosis not present

## 2018-07-08 DIAGNOSIS — W1839XA Other fall on same level, initial encounter: Secondary | ICD-10-CM | POA: Diagnosis not present

## 2018-07-08 DIAGNOSIS — I5042 Chronic combined systolic (congestive) and diastolic (congestive) heart failure: Secondary | ICD-10-CM | POA: Diagnosis not present

## 2018-07-08 DIAGNOSIS — B379 Candidiasis, unspecified: Secondary | ICD-10-CM | POA: Diagnosis not present

## 2018-07-08 DIAGNOSIS — I2510 Atherosclerotic heart disease of native coronary artery without angina pectoris: Secondary | ICD-10-CM | POA: Diagnosis not present

## 2018-07-08 DIAGNOSIS — E876 Hypokalemia: Secondary | ICD-10-CM | POA: Diagnosis not present

## 2018-07-08 DIAGNOSIS — N183 Chronic kidney disease, stage 3 (moderate): Secondary | ICD-10-CM | POA: Diagnosis not present

## 2018-07-08 DIAGNOSIS — R269 Unspecified abnormalities of gait and mobility: Secondary | ICD-10-CM | POA: Diagnosis not present

## 2018-07-11 DIAGNOSIS — I2510 Atherosclerotic heart disease of native coronary artery without angina pectoris: Secondary | ICD-10-CM | POA: Diagnosis not present

## 2018-07-11 DIAGNOSIS — R269 Unspecified abnormalities of gait and mobility: Secondary | ICD-10-CM | POA: Diagnosis not present

## 2018-07-11 DIAGNOSIS — R079 Chest pain, unspecified: Secondary | ICD-10-CM | POA: Diagnosis not present

## 2018-07-11 DIAGNOSIS — E876 Hypokalemia: Secondary | ICD-10-CM | POA: Diagnosis not present

## 2018-07-11 DIAGNOSIS — W1839XA Other fall on same level, initial encounter: Secondary | ICD-10-CM | POA: Diagnosis not present

## 2018-07-11 DIAGNOSIS — B379 Candidiasis, unspecified: Secondary | ICD-10-CM | POA: Diagnosis not present

## 2018-07-11 DIAGNOSIS — I5042 Chronic combined systolic (congestive) and diastolic (congestive) heart failure: Secondary | ICD-10-CM | POA: Diagnosis not present

## 2018-07-11 DIAGNOSIS — N183 Chronic kidney disease, stage 3 (moderate): Secondary | ICD-10-CM | POA: Diagnosis not present

## 2018-07-11 DIAGNOSIS — K219 Gastro-esophageal reflux disease without esophagitis: Secondary | ICD-10-CM | POA: Diagnosis not present

## 2018-07-11 DIAGNOSIS — M6281 Muscle weakness (generalized): Secondary | ICD-10-CM | POA: Diagnosis not present

## 2018-08-05 DIAGNOSIS — I5042 Chronic combined systolic (congestive) and diastolic (congestive) heart failure: Secondary | ICD-10-CM | POA: Diagnosis not present

## 2018-08-05 DIAGNOSIS — I2510 Atherosclerotic heart disease of native coronary artery without angina pectoris: Secondary | ICD-10-CM | POA: Diagnosis not present

## 2018-08-05 DIAGNOSIS — N183 Chronic kidney disease, stage 3 (moderate): Secondary | ICD-10-CM | POA: Diagnosis not present

## 2018-08-05 DIAGNOSIS — B379 Candidiasis, unspecified: Secondary | ICD-10-CM | POA: Diagnosis not present

## 2018-08-05 DIAGNOSIS — W1839XA Other fall on same level, initial encounter: Secondary | ICD-10-CM | POA: Diagnosis not present

## 2018-08-05 DIAGNOSIS — R079 Chest pain, unspecified: Secondary | ICD-10-CM | POA: Diagnosis not present

## 2018-08-05 DIAGNOSIS — E876 Hypokalemia: Secondary | ICD-10-CM | POA: Diagnosis not present

## 2018-08-05 DIAGNOSIS — R269 Unspecified abnormalities of gait and mobility: Secondary | ICD-10-CM | POA: Diagnosis not present

## 2018-08-05 DIAGNOSIS — M6281 Muscle weakness (generalized): Secondary | ICD-10-CM | POA: Diagnosis not present

## 2018-08-05 DIAGNOSIS — K219 Gastro-esophageal reflux disease without esophagitis: Secondary | ICD-10-CM | POA: Diagnosis not present

## 2018-09-09 DIAGNOSIS — G301 Alzheimer's disease with late onset: Secondary | ICD-10-CM | POA: Diagnosis not present

## 2018-09-09 DIAGNOSIS — R69 Illness, unspecified: Secondary | ICD-10-CM | POA: Diagnosis not present

## 2018-10-20 DIAGNOSIS — Z03818 Encounter for observation for suspected exposure to other biological agents ruled out: Secondary | ICD-10-CM | POA: Diagnosis not present

## 2018-11-04 DIAGNOSIS — M6281 Muscle weakness (generalized): Secondary | ICD-10-CM | POA: Diagnosis not present

## 2018-11-04 DIAGNOSIS — I959 Hypotension, unspecified: Secondary | ICD-10-CM | POA: Diagnosis not present

## 2018-11-04 DIAGNOSIS — N183 Chronic kidney disease, stage 3 (moderate): Secondary | ICD-10-CM | POA: Diagnosis not present

## 2019-01-27 DIAGNOSIS — K30 Functional dyspepsia: Secondary | ICD-10-CM | POA: Diagnosis not present

## 2019-01-27 DIAGNOSIS — N1831 Chronic kidney disease, stage 3a: Secondary | ICD-10-CM | POA: Diagnosis not present

## 2019-01-27 DIAGNOSIS — R2689 Other abnormalities of gait and mobility: Secondary | ICD-10-CM | POA: Diagnosis not present

## 2019-01-27 DIAGNOSIS — Z03818 Encounter for observation for suspected exposure to other biological agents ruled out: Secondary | ICD-10-CM | POA: Diagnosis not present

## 2019-02-03 DIAGNOSIS — Z03818 Encounter for observation for suspected exposure to other biological agents ruled out: Secondary | ICD-10-CM | POA: Diagnosis not present

## 2019-02-24 DIAGNOSIS — R69 Illness, unspecified: Secondary | ICD-10-CM | POA: Diagnosis not present

## 2019-02-24 DIAGNOSIS — G301 Alzheimer's disease with late onset: Secondary | ICD-10-CM | POA: Diagnosis not present

## 2020-12-27 DEATH — deceased
